# Patient Record
Sex: Female | Born: 1970 | Race: Black or African American | Hispanic: No | Marital: Single | Smoking: Former smoker
Health system: Southern US, Community
[De-identification: ages and names within clinical notes are randomized; demographics above are authoritative.]

## PROBLEM LIST (undated history)

## (undated) DIAGNOSIS — E119 Type 2 diabetes mellitus without complications: Secondary | ICD-10-CM

## (undated) DIAGNOSIS — E1159 Type 2 diabetes mellitus with other circulatory complications: Secondary | ICD-10-CM

## (undated) DIAGNOSIS — E1169 Type 2 diabetes mellitus with other specified complication: Secondary | ICD-10-CM

## (undated) DIAGNOSIS — G4733 Obstructive sleep apnea (adult) (pediatric): Secondary | ICD-10-CM

## (undated) DIAGNOSIS — I10 Essential (primary) hypertension: Secondary | ICD-10-CM

## (undated) DIAGNOSIS — M1990 Unspecified osteoarthritis, unspecified site: Secondary | ICD-10-CM

## (undated) HISTORY — PX: TONSILLECTOMY: SUR1361

## (undated) HISTORY — PX: NOVASURE ABLATION: SHX5394

## (undated) HISTORY — PX: ADENOIDECTOMY: SUR15

## (undated) HISTORY — PX: BREAST REDUCTION SURGERY: SHX8

## (undated) HISTORY — PX: NASAL SEPTUM SURGERY: SHX37

---

## 2000-07-26 ENCOUNTER — Emergency Department (HOSPITAL_COMMUNITY): Admission: EM | Admit: 2000-07-26 | Discharge: 2000-07-26 | Payer: Self-pay | Admitting: Emergency Medicine

## 2000-07-28 ENCOUNTER — Inpatient Hospital Stay (HOSPITAL_COMMUNITY): Admission: RE | Admit: 2000-07-28 | Discharge: 2000-07-28 | Payer: Self-pay

## 2000-08-03 ENCOUNTER — Other Ambulatory Visit: Admission: RE | Admit: 2000-08-03 | Discharge: 2000-08-03 | Payer: Self-pay | Admitting: Obstetrics

## 2000-08-03 ENCOUNTER — Encounter: Admission: RE | Admit: 2000-08-03 | Discharge: 2000-08-03 | Payer: Self-pay | Admitting: Obstetrics

## 2000-09-15 ENCOUNTER — Emergency Department (HOSPITAL_COMMUNITY): Admission: EM | Admit: 2000-09-15 | Discharge: 2000-09-15 | Payer: Self-pay | Admitting: Emergency Medicine

## 2001-08-12 ENCOUNTER — Emergency Department (HOSPITAL_COMMUNITY): Admission: EM | Admit: 2001-08-12 | Discharge: 2001-08-12 | Payer: Self-pay | Admitting: Emergency Medicine

## 2001-08-20 ENCOUNTER — Emergency Department (HOSPITAL_COMMUNITY): Admission: EM | Admit: 2001-08-20 | Discharge: 2001-08-20 | Payer: Self-pay | Admitting: Emergency Medicine

## 2002-08-08 ENCOUNTER — Emergency Department (HOSPITAL_COMMUNITY): Admission: EM | Admit: 2002-08-08 | Discharge: 2002-08-08 | Payer: Self-pay | Admitting: *Deleted

## 2002-08-14 ENCOUNTER — Emergency Department (HOSPITAL_COMMUNITY): Admission: EM | Admit: 2002-08-14 | Discharge: 2002-08-14 | Payer: Self-pay | Admitting: Emergency Medicine

## 2002-08-14 ENCOUNTER — Encounter: Payer: Self-pay | Admitting: Emergency Medicine

## 2002-09-09 ENCOUNTER — Emergency Department (HOSPITAL_COMMUNITY): Admission: EM | Admit: 2002-09-09 | Discharge: 2002-09-10 | Payer: Self-pay | Admitting: Emergency Medicine

## 2002-09-27 ENCOUNTER — Emergency Department (HOSPITAL_COMMUNITY): Admission: EM | Admit: 2002-09-27 | Discharge: 2002-09-27 | Payer: Self-pay | Admitting: Emergency Medicine

## 2003-04-13 ENCOUNTER — Emergency Department (HOSPITAL_COMMUNITY): Admission: EM | Admit: 2003-04-13 | Discharge: 2003-04-13 | Payer: Self-pay | Admitting: Emergency Medicine

## 2003-05-06 ENCOUNTER — Emergency Department (HOSPITAL_COMMUNITY): Admission: EM | Admit: 2003-05-06 | Discharge: 2003-05-06 | Payer: Self-pay | Admitting: Emergency Medicine

## 2003-05-27 ENCOUNTER — Emergency Department (HOSPITAL_COMMUNITY): Admission: EM | Admit: 2003-05-27 | Discharge: 2003-05-27 | Payer: Self-pay

## 2003-10-18 ENCOUNTER — Emergency Department (HOSPITAL_COMMUNITY): Admission: EM | Admit: 2003-10-18 | Discharge: 2003-10-18 | Payer: Self-pay | Admitting: Emergency Medicine

## 2003-11-15 ENCOUNTER — Emergency Department (HOSPITAL_COMMUNITY): Admission: EM | Admit: 2003-11-15 | Discharge: 2003-11-15 | Payer: Self-pay | Admitting: Emergency Medicine

## 2004-05-10 ENCOUNTER — Emergency Department (HOSPITAL_COMMUNITY): Admission: EM | Admit: 2004-05-10 | Discharge: 2004-05-10 | Payer: Self-pay | Admitting: Emergency Medicine

## 2004-05-17 ENCOUNTER — Emergency Department (HOSPITAL_COMMUNITY): Admission: EM | Admit: 2004-05-17 | Discharge: 2004-05-17 | Payer: Self-pay | Admitting: Emergency Medicine

## 2004-06-02 ENCOUNTER — Ambulatory Visit (HOSPITAL_BASED_OUTPATIENT_CLINIC_OR_DEPARTMENT_OTHER): Admission: RE | Admit: 2004-06-02 | Discharge: 2004-06-02 | Payer: Self-pay | Admitting: Otolaryngology

## 2004-07-22 ENCOUNTER — Inpatient Hospital Stay (HOSPITAL_COMMUNITY): Admission: RE | Admit: 2004-07-22 | Discharge: 2004-07-26 | Payer: Self-pay | Admitting: Otolaryngology

## 2004-07-22 ENCOUNTER — Encounter (INDEPENDENT_AMBULATORY_CARE_PROVIDER_SITE_OTHER): Payer: Self-pay | Admitting: *Deleted

## 2004-09-13 ENCOUNTER — Emergency Department (HOSPITAL_COMMUNITY): Admission: EM | Admit: 2004-09-13 | Discharge: 2004-09-13 | Payer: Self-pay | Admitting: Emergency Medicine

## 2004-11-29 ENCOUNTER — Emergency Department (HOSPITAL_COMMUNITY): Admission: EM | Admit: 2004-11-29 | Discharge: 2004-11-29 | Payer: Self-pay | Admitting: Emergency Medicine

## 2004-12-14 ENCOUNTER — Other Ambulatory Visit: Admission: RE | Admit: 2004-12-14 | Discharge: 2004-12-14 | Payer: Self-pay | Admitting: Obstetrics and Gynecology

## 2004-12-16 ENCOUNTER — Ambulatory Visit (HOSPITAL_COMMUNITY): Admission: RE | Admit: 2004-12-16 | Discharge: 2004-12-16 | Payer: Self-pay | Admitting: Obstetrics and Gynecology

## 2004-12-16 ENCOUNTER — Encounter (INDEPENDENT_AMBULATORY_CARE_PROVIDER_SITE_OTHER): Payer: Self-pay | Admitting: *Deleted

## 2006-02-14 ENCOUNTER — Emergency Department (HOSPITAL_COMMUNITY): Admission: EM | Admit: 2006-02-14 | Discharge: 2006-02-14 | Payer: Self-pay | Admitting: Emergency Medicine

## 2006-03-30 ENCOUNTER — Encounter: Admission: RE | Admit: 2006-03-30 | Discharge: 2006-03-30 | Payer: Self-pay | Admitting: Internal Medicine

## 2006-05-16 IMAGING — CR DG CHEST 2V
2 series · 2 of 2 positions shown · non-contrast
Comparison: 09/13/04.

CLINICAL DATA: Vaginal bleeding/weakness/cough.
 CHEST ? TWO VIEW:

[view not recorded (1 of 2)]
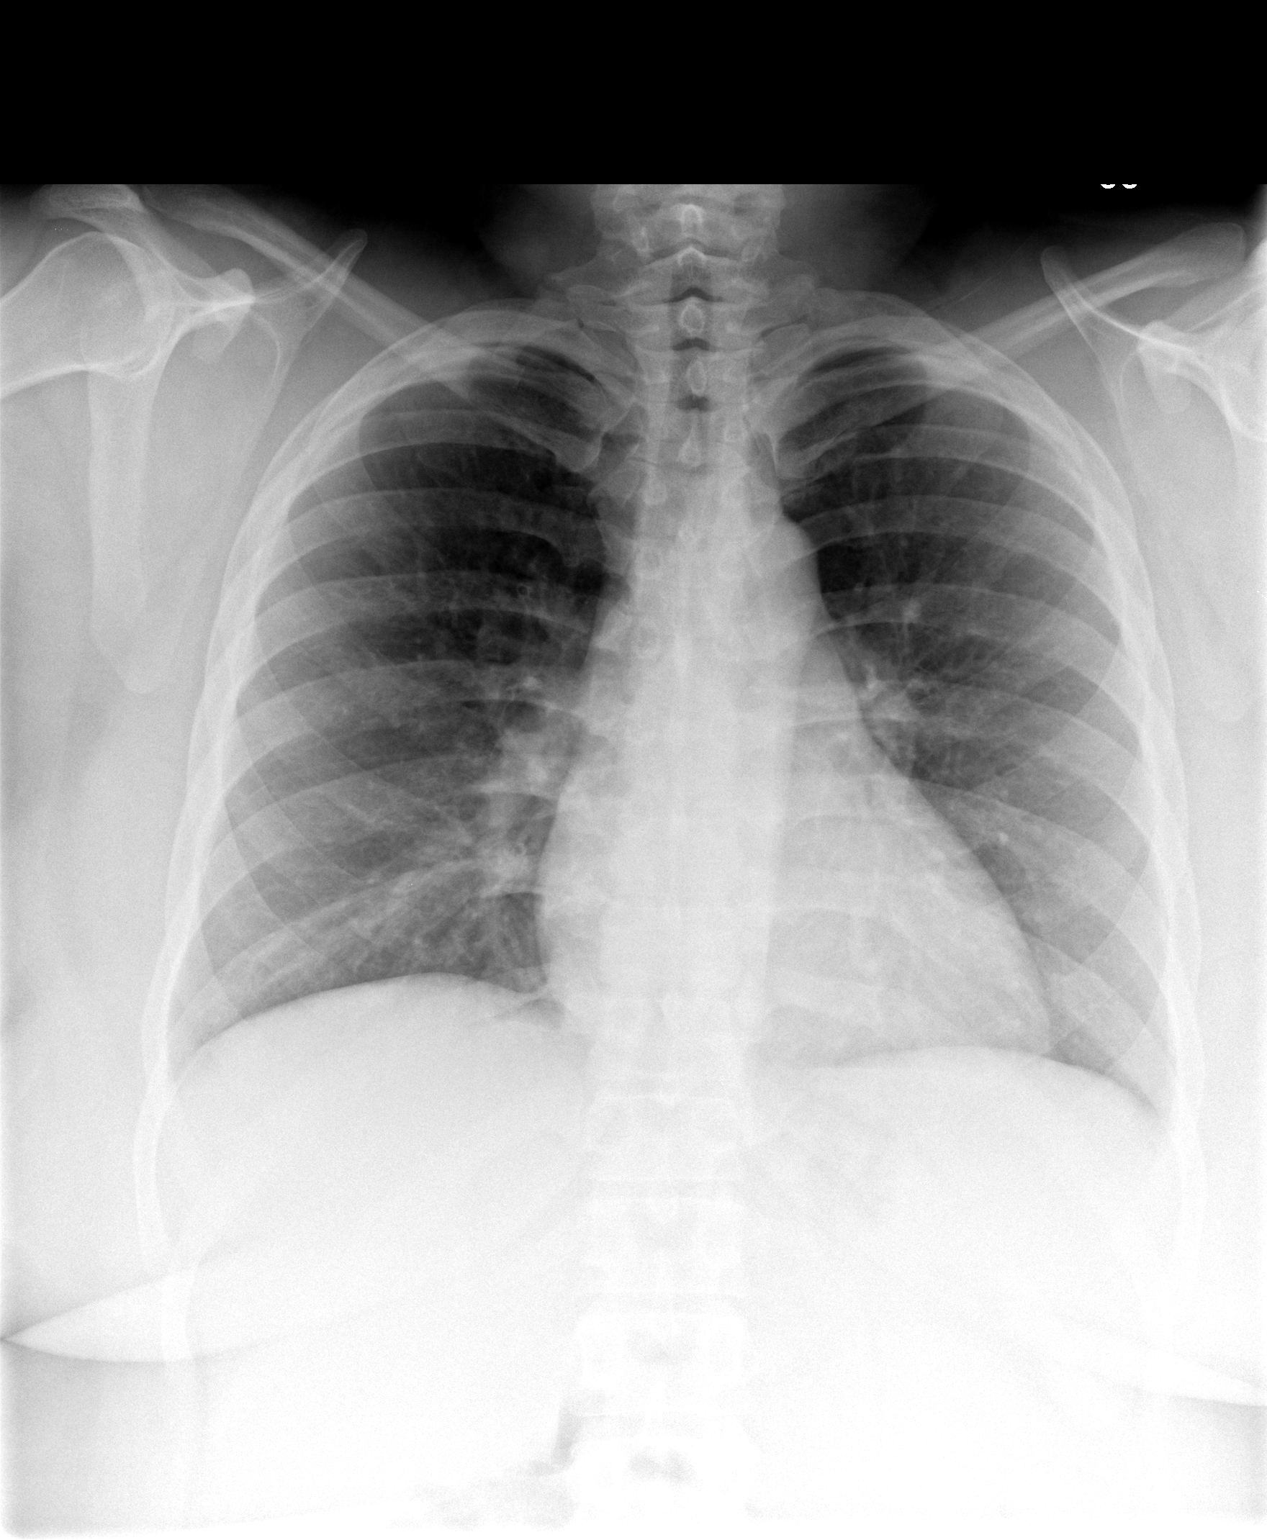

[view not recorded (2 of 2)]
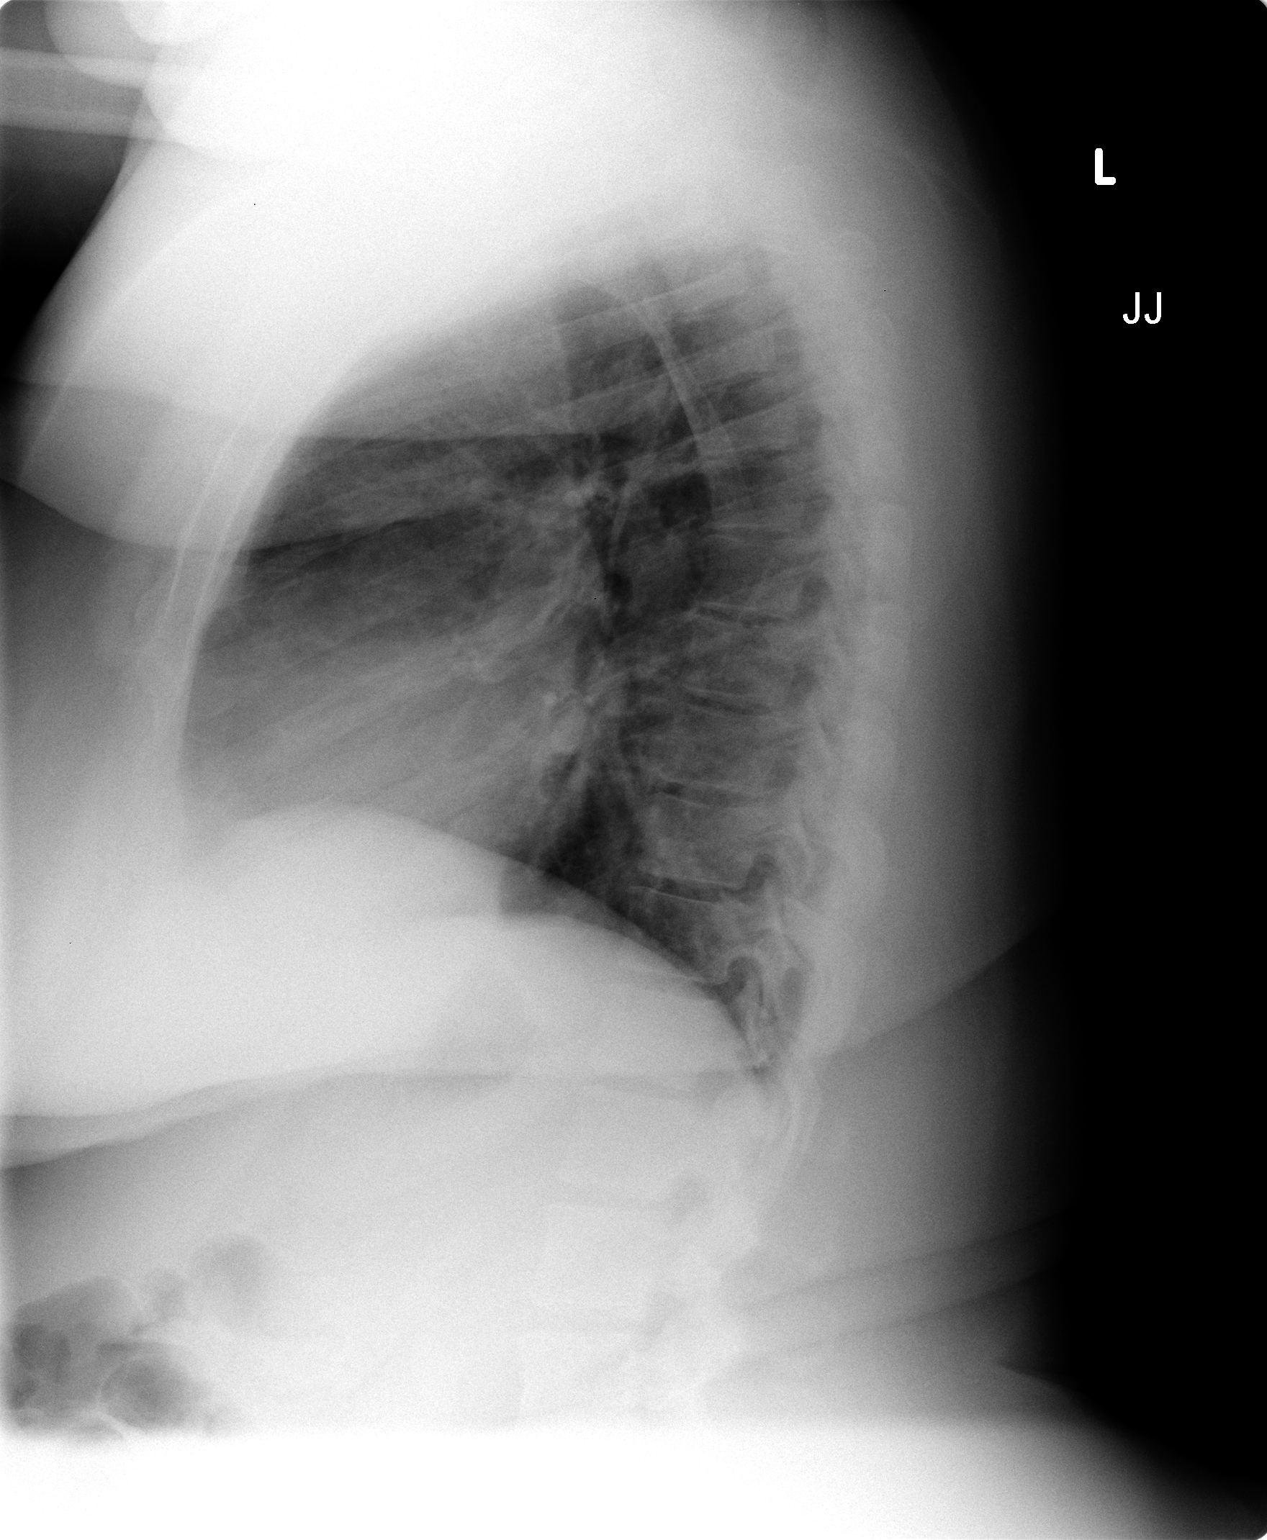

[2 of 2 positions shown; findings below may reference images not displayed]

FINDINGS: Heart within normal limits.  Lungs clear.  Osseous structures intact.
IMPRESSION: No active disease.

## 2007-01-04 ENCOUNTER — Emergency Department (HOSPITAL_COMMUNITY): Admission: EM | Admit: 2007-01-04 | Discharge: 2007-01-04 | Payer: Self-pay | Admitting: Emergency Medicine

## 2007-02-12 ENCOUNTER — Emergency Department (HOSPITAL_COMMUNITY): Admission: EM | Admit: 2007-02-12 | Discharge: 2007-02-12 | Payer: Self-pay | Admitting: Emergency Medicine

## 2007-12-13 ENCOUNTER — Emergency Department (HOSPITAL_COMMUNITY): Admission: EM | Admit: 2007-12-13 | Discharge: 2007-12-13 | Payer: Self-pay | Admitting: Emergency Medicine

## 2007-12-24 ENCOUNTER — Emergency Department (HOSPITAL_COMMUNITY): Admission: EM | Admit: 2007-12-24 | Discharge: 2007-12-24 | Payer: Self-pay | Admitting: Emergency Medicine

## 2009-04-30 ENCOUNTER — Emergency Department (HOSPITAL_COMMUNITY): Admission: EM | Admit: 2009-04-30 | Discharge: 2009-04-30 | Payer: Self-pay | Admitting: Emergency Medicine

## 2009-06-23 ENCOUNTER — Emergency Department (HOSPITAL_COMMUNITY): Admission: EM | Admit: 2009-06-23 | Discharge: 2009-06-23 | Payer: Self-pay | Admitting: Emergency Medicine

## 2009-11-03 ENCOUNTER — Emergency Department (HOSPITAL_COMMUNITY): Admission: EM | Admit: 2009-11-03 | Discharge: 2009-11-03 | Payer: Self-pay | Admitting: Emergency Medicine

## 2010-11-17 ENCOUNTER — Emergency Department (HOSPITAL_COMMUNITY)
Admission: EM | Admit: 2010-11-17 | Discharge: 2010-11-17 | Disposition: A | Discharge status: Home / Self Care | Payer: Self-pay | Attending: Emergency Medicine | Admitting: Emergency Medicine

## 2010-11-17 DIAGNOSIS — IMO0001 Reserved for inherently not codable concepts without codable children: Secondary | ICD-10-CM | POA: Insufficient documentation

## 2010-11-17 DIAGNOSIS — R197 Diarrhea, unspecified: Secondary | ICD-10-CM | POA: Insufficient documentation

## 2010-11-17 DIAGNOSIS — I10 Essential (primary) hypertension: Secondary | ICD-10-CM | POA: Insufficient documentation

## 2010-11-17 DIAGNOSIS — J3489 Other specified disorders of nose and nasal sinuses: Secondary | ICD-10-CM | POA: Insufficient documentation

## 2010-11-17 DIAGNOSIS — A5901 Trichomonal vulvovaginitis: Secondary | ICD-10-CM | POA: Insufficient documentation

## 2010-11-17 DIAGNOSIS — J069 Acute upper respiratory infection, unspecified: Secondary | ICD-10-CM | POA: Insufficient documentation

## 2010-11-17 DIAGNOSIS — R6889 Other general symptoms and signs: Secondary | ICD-10-CM | POA: Insufficient documentation

## 2010-11-17 DIAGNOSIS — R6883 Chills (without fever): Secondary | ICD-10-CM | POA: Insufficient documentation

## 2010-11-17 DIAGNOSIS — R059 Cough, unspecified: Secondary | ICD-10-CM | POA: Insufficient documentation

## 2010-11-17 DIAGNOSIS — R05 Cough: Secondary | ICD-10-CM | POA: Insufficient documentation

## 2010-11-17 DIAGNOSIS — R1013 Epigastric pain: Secondary | ICD-10-CM | POA: Insufficient documentation

## 2010-11-17 LAB — DIFFERENTIAL
Eosinophils Relative: 2 % (ref 0–5)
Lymphocytes Relative: 25 % (ref 12–46)
Lymphs Abs: 2.2 10*3/uL (ref 0.7–4.0)

## 2010-11-17 LAB — POCT I-STAT, CHEM 8
BUN: 7 mg/dL (ref 6–23)
Creatinine, Ser: 0.8 mg/dL (ref 0.4–1.2)
HCT: 39 % (ref 36.0–46.0)
Hemoglobin: 13.3 g/dL (ref 12.0–15.0)
Sodium: 139 mEq/L (ref 135–145)
TCO2: 33 mmol/L (ref 0–100)

## 2010-11-17 LAB — URINALYSIS, ROUTINE W REFLEX MICROSCOPIC
Bilirubin Urine: NEGATIVE
Hgb urine dipstick: NEGATIVE
Ketones, ur: NEGATIVE mg/dL
Specific Gravity, Urine: 1.02 (ref 1.005–1.030)
Urine Glucose, Fasting: NEGATIVE mg/dL
pH: 6 (ref 5.0–8.0)

## 2010-11-17 LAB — URINE MICROSCOPIC-ADD ON

## 2010-11-17 LAB — CBC
Hemoglobin: 11.9 g/dL — ABNORMAL LOW (ref 12.0–15.0)
RDW: 16 % — ABNORMAL HIGH (ref 11.5–15.5)
WBC: 8.7 10*3/uL (ref 4.0–10.5)

## 2010-11-17 LAB — WET PREP, GENITAL: Clue Cells Wet Prep HPF POC: NONE SEEN

## 2010-11-18 LAB — GC/CHLAMYDIA PROBE AMP, GENITAL
Chlamydia, DNA Probe: NEGATIVE
GC Probe Amp, Genital: NEGATIVE

## 2011-01-11 ENCOUNTER — Other Ambulatory Visit: Payer: Self-pay | Admitting: Internal Medicine

## 2011-01-11 DIAGNOSIS — Z1231 Encounter for screening mammogram for malignant neoplasm of breast: Secondary | ICD-10-CM

## 2011-01-14 ENCOUNTER — Other Ambulatory Visit: Payer: Self-pay | Admitting: Obstetrics and Gynecology

## 2011-01-17 ENCOUNTER — Ambulatory Visit
Admission: RE | Admit: 2011-01-17 | Discharge: 2011-01-17 | Disposition: A | Discharge status: Home / Self Care | Payer: Medicaid Other | Source: Ambulatory Visit | Attending: Internal Medicine | Admitting: Internal Medicine

## 2011-01-17 DIAGNOSIS — Z1231 Encounter for screening mammogram for malignant neoplasm of breast: Secondary | ICD-10-CM

## 2011-02-03 ENCOUNTER — Encounter (HOSPITAL_COMMUNITY)
Admission: RE | Admit: 2011-02-03 | Discharge: 2011-02-03 | Disposition: A | Discharge status: Home / Self Care | Payer: Medicaid Other | Source: Ambulatory Visit | Attending: Obstetrics and Gynecology | Admitting: Obstetrics and Gynecology

## 2011-02-04 ENCOUNTER — Ambulatory Visit (HOSPITAL_BASED_OUTPATIENT_CLINIC_OR_DEPARTMENT_OTHER): Payer: Medicaid Other | Attending: Internal Medicine

## 2011-02-04 DIAGNOSIS — G4733 Obstructive sleep apnea (adult) (pediatric): Secondary | ICD-10-CM | POA: Insufficient documentation

## 2011-02-04 DIAGNOSIS — R0609 Other forms of dyspnea: Secondary | ICD-10-CM | POA: Insufficient documentation

## 2011-02-04 DIAGNOSIS — R0989 Other specified symptoms and signs involving the circulatory and respiratory systems: Secondary | ICD-10-CM | POA: Insufficient documentation

## 2011-02-11 ENCOUNTER — Ambulatory Visit (HOSPITAL_COMMUNITY)
Admission: RE | Admit: 2011-02-11 | Payer: Medicaid Other | Source: Ambulatory Visit | Admitting: Obstetrics and Gynecology

## 2011-02-12 DIAGNOSIS — G4733 Obstructive sleep apnea (adult) (pediatric): Secondary | ICD-10-CM

## 2011-02-12 DIAGNOSIS — R0989 Other specified symptoms and signs involving the circulatory and respiratory systems: Secondary | ICD-10-CM

## 2011-02-12 DIAGNOSIS — G4730 Sleep apnea, unspecified: Secondary | ICD-10-CM

## 2011-02-12 DIAGNOSIS — G4710 Hypersomnia, unspecified: Secondary | ICD-10-CM

## 2011-02-12 DIAGNOSIS — R0609 Other forms of dyspnea: Secondary | ICD-10-CM

## 2011-02-12 NOTE — Procedures (Signed)
NAME:  Sara Chan, Sara Chan                ACCOUNT NO.:  [id number]  MEDICAL RECORD NO.:  [id number]          PATIENT TYPE:  OUT  LOCATION:  SLEEP CENTER                 FACILITY:  [facility]  PHYSICIAN:  Clinton D. Maple Hudson, MD, FCCP, FACPDATE OF BIRTH:  01-19-1969  DATE OF STUDY:  02/04/2011                           NOCTURNAL POLYSOMNOGRAM  REFERRING PHYSICIAN:  EDWIN A AVBUERE  INDICATION FOR STUDY:  Hypersomnia with sleep apnea.  EPWORTH SLEEPINESS SCORE:  14/24, BMI 57.6.  Weight 295 pounds, height 60 inches.  Neck 16 inches.  MEDICATIONS:  Home medications are charted and reviewed.  SLEEP ARCHITECTURE:  Split-study protocol.  During the diagnostic phase, total sleep time 120 minutes with sleep efficiency 55.8%.  Stage I was 36.7%, stage II 63.3%, stages III and REM were absent.  Sleep latency 50.5 minutes, awake after sleep onset 44.5 minutes, arousal index 138 indicating increased EEG arousal.  BEDTIME MEDICATION:  None.  RESPIRATORY DATA:  Split-study protocol.  Apnea-hypopnea index (AHI) 155 per hour.  A total of 310 events was scored including 53 obstructive apneas, 3 central apneas, 254 hypopneas.  Events were not positional. CPAP was then titrated to 13 CWP, AHI 0 per hour.  She wore a medium ResMed Mirage Quattro full-face mask with heated humidifier.  OXYGEN DATA:  Moderately loud snoring before CPAP with oxygen desaturation to a nadir of 77% on room air.  With CPAP titration, mean oxygen saturation held 94.1% on room air and snoring was prevented.  CARDIAC DATA:  Sinus rhythm with frequent PACs.  MOVEMENT-PARASOMNIA:  No significant movement disturbance.  Bathroom x1.  IMPRESSIONS-RECOMMENDATIONS:  . 1. Severe obstructive sleep apnea/hypopnea syndrome, AHI 155 per hour     with non positional events, moderately loud snoring and oxygen     desaturation to a nadir of 77% on room air. 2. Successful CPAP titration to 13 CWP, AHI 0 per hour.  She wore a     medium ResMed  Mirage Quattro full-face mask with heated humidifier.     Clinton D. Maple Hudson, MD, [facility], FACP Diplomate, Biomedical engineer of Sleep Medicine Electronically Signed    CDY/MEDQ  D:  02/12/2011 08:43:20  T:  02/12/2011 09:45:05  Job:  [id number]

## 2011-02-14 ENCOUNTER — Other Ambulatory Visit (HOSPITAL_COMMUNITY): Payer: Medicaid Other

## 2011-02-22 ENCOUNTER — Other Ambulatory Visit: Payer: Self-pay | Admitting: Obstetrics and Gynecology

## 2011-02-22 ENCOUNTER — Encounter (HOSPITAL_COMMUNITY): Payer: Medicaid Other

## 2011-02-22 LAB — CBC
HCT: 35.4 % — ABNORMAL LOW (ref 36.0–46.0)
MCH: 26.6 pg (ref 26.0–34.0)
RBC: 4.28 MIL/uL (ref 3.87–5.11)
RDW: 15.6 % — ABNORMAL HIGH (ref 11.5–15.5)
WBC: 7.9 10*3/uL (ref 4.0–10.5)

## 2011-02-22 LAB — PROTIME-INR
INR: 0.94 (ref 0.00–1.49)
Prothrombin Time: 12.8 seconds (ref 11.6–15.2)

## 2011-02-22 LAB — BASIC METABOLIC PANEL
BUN: 8 mg/dL (ref 6–23)
CO2: 26 mEq/L (ref 19–32)
GFR calc non Af Amer: >60 mL/min (ref >60)
Glucose, Bld: 181 mg/dL — ABNORMAL HIGH (ref 70–99)

## 2011-02-22 LAB — URINE MICROSCOPIC-ADD ON

## 2011-02-22 LAB — URINALYSIS, ROUTINE W REFLEX MICROSCOPIC
Ketones, ur: NEGATIVE mg/dL
Leukocytes, UA: NEGATIVE
Urobilinogen, UA: 0.2 mg/dL (ref 0.0–1.0)
pH: 5.5 (ref 5.0–8.0)

## 2011-02-24 ENCOUNTER — Ambulatory Visit (HOSPITAL_COMMUNITY)
Admission: RE | Admit: 2011-02-24 | Discharge: 2011-02-24 | Disposition: A | Discharge status: Home / Self Care | Payer: Medicaid Other | Source: Ambulatory Visit | Attending: Obstetrics and Gynecology | Admitting: Obstetrics and Gynecology

## 2011-02-24 ENCOUNTER — Other Ambulatory Visit: Payer: Self-pay | Admitting: Obstetrics and Gynecology

## 2011-02-24 DIAGNOSIS — N920 Excessive and frequent menstruation with regular cycle: Secondary | ICD-10-CM | POA: Insufficient documentation

## 2011-02-24 DIAGNOSIS — Z01818 Encounter for other preprocedural examination: Secondary | ICD-10-CM | POA: Insufficient documentation

## 2011-02-24 DIAGNOSIS — Z01812 Encounter for preprocedural laboratory examination: Secondary | ICD-10-CM | POA: Insufficient documentation

## 2011-02-24 LAB — GLUCOSE, CAPILLARY: Glucose-Capillary: 143 mg/dL — ABNORMAL HIGH (ref 70–99)

## 2011-02-28 NOTE — Op Note (Signed)
NAME:  Sara Chan, Sara Chan NO.:  [id number]  MEDICAL RECORD NO.:  [id number]           PATIENT TYPE:  O  LOCATION:  [facility]                          FACILITY:  [facility]  PHYSICIAN:  Miguel Aschoff, M.D.       DATE OF BIRTH:  Jun 07, 1971  DATE OF PROCEDURE:  02/24/2011 DATE OF DISCHARGE:                              OPERATIVE REPORT   PREOPERATIVE DIAGNOSIS:  Menorrhagia.  POSTOPERATIVE DIAGNOSIS:  Menorrhagia.  PROCEDURE:  Cervical dilatation, hysteroscopy, uterine curettage followed by NovaSure endometrial ablation.  SURGEON:  Miguel Aschoff, MD  ANESTHESIA:  General.  COMPLICATIONS:  None.  JUSTIFICATION:  The patient is a 40 year old black female with history of regular, but very heavy periods which cause her to pass clots and interfere with her ability to carry out her normal routines of life. Because of the menorrhagia, she presents now to undergo D and C, hysteroscopy and endometrial ablation in an effort to reduce or eliminate the heavy bleeding.  The risks and benefits of procedure were discussed with the patient and informed consent has been obtained.  PROCEDURE:  The patient was taken to the operating room and placed in supine position.  General anesthesia was administered without difficulty.  She was then placed in dorsal lithotomy position, prepped and draped in the usual sterile fashion.  After this was done, bladder was catheterized.  Examination under anesthesia revealed normal external genitalia, normal Bartholin, Skene glands, normal urethra.  The vaginal vault was without gross lesion.  The cervix was without gross lesion. Uterus noted to be anterior, normal in size and shape.  No adnexal masses were noted.  At this point, a speculum was placed in the vaginal vault.  The anterior cervical lip was grasped with tenaculum and then the cervix was sounded to 8 cm.  A cervical length of 4 cm was then established.  Once this was done, the cervix was further  dilated and then diagnostic hysteroscope was advanced through the endocervical canal.  No endocervical lesions were noted.  Inspection of the uterine fundus did not reveal any polyps or submucous myomas or obvious lesions within the fundus.  At this point, the hysteroscope was removed, sharp vigorous curettage was carried out with medium-size serrated curette, small amount of curettings obtained were sent for histologic study. Once this was done, the NovaSure endometrial ablation unit was introduced into uterine cavity, cavity width of 3.5 cm was found, and then once this was done, a cavity assessment test was carried out and passed and then a treatment cycle for 1 minute 15 seconds was carried out at 77 watts.  On completion of the treatment cycle, the hysteroscope was reintroduced.  There appeared to be good coagulation of the endometrial cavity with no other abnormalities being noted.  The instruments were removed.  The cervix was injected with total of 10 mL of 1% Xylocaine for postop analgesia.  The patient was taken out of lithotomy position and brought to recovery room in satisfactory condition.  Estimated blood loss from the procedure was approximately 20 mL.  The patient tolerated the procedure well.  Plan is for the  patient to be discharged home.  Medications for home include doxycycline 100 mg twice a day x3 days, Ultram 50 mg p.o. t.i.d. The patient will be seen back in 4 weeks for follow up examination and is to call for any problems such as fever, pain or heavy bleeding.     Miguel Aschoff, M.D.     AR/MEDQ  D:  02/24/2011  T:  02/25/2011  Job:  [id number]  Electronically Signed by Miguel Aschoff M.D. on 02/28/2011 09:10:45 AM

## 2011-03-04 NOTE — Op Note (Signed)
NAME:  Sara Chan, Sara Chan                ACCOUNT NO.:  [id number]   MEDICAL RECORD NO.:  [id number]          PATIENT TYPE:  INP   LOCATION:  2550                         FACILITY:  [facility]   PHYSICIAN:  Zola Button T. Lazarus Salines, M.D. DATE OF BIRTH:  October 24, 1970   DATE OF PROCEDURE:  07/22/2004  DATE OF DISCHARGE:                                 OPERATIVE REPORT   PREOPERATIVE DIAGNOSES:  1.  Nasal septal deviation.  2.  Hypertrophic inferior turbinates.  3.  Adenotonsillar hypertrophy with obstruction.  4.  Obstructive sleep apnea.  5.  Morbid obesity.   POSTOPERATIVE DIAGNOSES:  1.  Nasal septal deviation.  2.  Hypertrophic inferior turbinates.  3.  Adenotonsillar hypertrophy with obstruction.  4.  Obstructive sleep apnea.  5.  Morbid obesity.   OPERATION PERFORMED:  1.  Nasal septoplasty.  2.  Bilateral submucous resection of inferior turbinates.  3.  Tonsillectomy and adenoidectomy.   SURGEON:  Gloris Manchester. Lazarus Salines, M.D.   ANESTHESIA:  General orotracheal anesthesia.   ESTIMATED BLOOD LOSS:  Less than 25 mL.   COMPLICATIONS:  None.   FINDINGS:  An overall somewhat small nose and very congested despite having  used preoperative Afrin spray.  A thick nasal septum deviated towards the  right anteriorly.  Hypertrophic inferior turbinates on both sides.  3 almost  4+ tonsils protruding and somewhat imbedded.  Circumferential soft tissue  redundancy and narrowing of the oropharynx and nasopharynx.  Extremely bulky  tongue.   DESCRIPTION OF PROCEDURE:  With the patient in the comfortable supine  position, general orotracheal anesthesia was induced without difficulty.  At  an appropriate level, the table was turned 90 degrees and the patient was  placed in a semisitting position.  A saline moistened throat pack was  placed.  Cocaine crystals, 200 mg total were applied on cotton carriers to  the anterior ethmoid and sphenopalatine ganglion regions on both sides.  Cocaine solution, 160 mg  total was applied on 1/2 x 3 inch cottonoids to  both sides of the nasal septum.  1% Xylocaine with 1:100,000 epinephrine, 12  mL total was infiltrated into the anterior floor of the nose, into the  membranous columella, into the anterior pole of the inferior turbinates, and  into the submucoperichondrial plane of the nasal septum on both sides.  Several minutes were allowed for hemostasis and anesthesia to take effect.  A sterile preparation and draping of the midface was accomplished.   The materials were removed from the nose and observed to be intact and  correct and number.  The findings were as described above.  A left-sided  anterior floor incision was executed.  A right-sided hemitransfixion  incision was executed.  Floor tunnels were elevated on both sides.  The  submucoperichondrial plane of the right septum was elevated up to the dorsum  of the nose back onto the perpendicular plate, brought downward and  communicated with the floor tunnel and brought forward.  The chondroethmoid  junction was identified and opened with a Risk analyst.  The opposite  submucoperiosteal plane was dissected.  The superior perpendicular plate  was  lysed with an open Jansen-Middleton forceps.  An inferior portion was then  submucosally dissected, rocked free and delivered with closed Leane Para-  Middleton forceps.  Bony spicules were carefully removed.  A 2 mm strip of  inferior quadrangular cartilage was resected to allow it to trap door into  the midline.  A 1 mm strip of deflected caudal edge of the quadrangular  cartilage was also resected.  The posterior inferior corner of the  quadrangular cartilage was resected.  The maxillary crest under this area  was removed with the osteotome and mallet.  This allowed the septum to  trapdoor into the midline.  It had a tendency to state deflected towards the  left side.  An incision between the septum and the upper lateral cartilage  was made in the tunnel  on the right side and transmucosally on the left  side.  This allowed more mobility to the septum.  The septum was secured to  the nasal spine with a figure-of-eight 4-0 PDS suture.  Hemostasis was  observed.  The septal tunnels were suctioned free.  Both flaps were  essentially intact.  Several incisions were closed with interrupted 4-0  chromic sutures.  Just prior to completing the septoplasty, the inferior  turbinates were infiltrated with 6 mL total of 1% Xylocaine with 1:100,000  epinephrine.  Following completion of the septoplasty, beginning on the  right side, the anterior hood of the inferior turbinate was lysed just  behind the nasal valve region.  The medial mucosa was incised in an anterior  upsloping fashion.  The turbinate was outfractured.  A laterally based flap  was developed.  Using angled turbinate scissors, the turbinate bone and  lateral mucosa was resected in a posterior downsloping fashion taking  virtually all of the anterior pole, leaving virtually all of the posterior  pole.  Bony spicules were carefully removed.  The cut mucosal edges were  suction coagulated.  At this point  the flap was laid back down, the  turbinate was outfractured and this side was completed.  The left side was  done in identical fashion.   0.040 reinforced Silastic splints were fashioned and placed against the  nasal septum and secured there with a 3-0 nylon stitch.  Double thickness  Telfa pads impregnated with Neosporin ointment were placed along the  inferior turbinates on each side.  6.5 mm nasal trumpets were shortened to  reach the nasopharynx and were placed on each side between the packing and  the Silastic to serve as part of the nasal packing but to allow some  postoperative airway.  Hemostasis was observed.  This completed the nasal  procedure.   At this point the patient was flattened and then placed in true Trendelenburg.  The draping was adjusted.  The throat pack was  removed after  first suctioning the pharynx clean.  The endotracheal tube was untaped and  positioned in the midline.  Taking care to protect lips, teeth and an  endotracheal tube, the Crowe-Davis mouth gag, a three-grooved blade was  introduced.  This did not provide adequate visualization.  Attempt was made  to get adequate visualization with a #4 flat blade which also was  inadequate.  Finally, a #4 grooved blade was placed and this provided  reasonable exposure.  The tonsils were touching one another in the midline.  The mouth gag was suspended from the Mayo stand in the standard fashion.  I  could not see the nasopharynx at all  owing to the bulk of the tonsils.  0.5%  Xylocaine with 1:200,000 epinephrine, 8 mL total was infiltrated in to the  peritonsillar planes for intraoperative hemostasis.  Several minutes were  allowed for this to take effect.   Beginning on the right side, the tonsil was grasped and retracted medially.  The mucosa overlying the anterior and superior poles was coagulated and then  cut down to the capsule of the tonsil.  Using the cautery tip as a blunt  dissector, lysing fibrous bands, and coagulating crossing vessels as  identified, the tonsil was dissected free of its muscular fossa from  inferiorly upward.  The dissection was very difficult owing to the bulky  disease and the relative poor exposure related to her bulky tongue and  rather small mandible.  The tonsil was removed in its entirety as determined  by examination of both tonsil and fossa.  A small additional quantity of  cautery rendered the fossa hemostatic.   After completing the right tonsillectomy, the left side was done in  identical fashion after first repositioning the mouth gag for better access.  After removing both tonsils and rendering the oropharynx hemostatic, a red  rubber catheter was passed through one of the nasal trumpets and out the  mouth to serve as a palate retractor.  Using  suction cautery and indirect  visualization, the moderate sized adenoid pad contained within the very  narrow nasopharynx was ablated with electrocautery.  No dissection was  required.  Hemostasis was observed.   At this point the palate retractor and mouth gag were relaxed for several  minutes.  Upon re-expansion, hemostasis was persistent.  A single 3-0 Vicryl  stitch was placed to approximate the posterior and anterior tonsillar  pillars on each side without difficulty.  An orogastric tube was passed into  the stomach and a small quantity of clear secretions was evacuated.  The  orogastric tube was removed.  At this point the mouth gag was relaxed once  more for approximately five minutes.  Upon re-expansion, hemostasis was  persistent.  At this point the palate retractor and mouth gag were relaxed  and removed.  The dental status was intact.  The patient was returned to anesthesia, awakened, extubated and transferred to recovery in stable  condition.   COMMENT:  A 40 year old black female with long history of snoring, poor rest  quality and a sleep study showing apnea index of 160 per hour was the  indication for today's surgery.  As a compromise with the patient we did not  perform uvulopalatopharyngoplasty or a tracheostomy.  Anticipate a routine  postoperative recovery with observation in the intermediate care unit until  she is breathing comfortably, taking decent p.o. and achieving adequate pain  relief.      Karo   KTW/MEDQ  D:  07/22/2004  T:  07/22/2004  Job:  [id number]

## 2011-03-04 NOTE — Procedures (Signed)
NAME:  Sara Chan, BREED                ACCOUNT NO.:  [id number]   MEDICAL RECORD NO.:  [id number]          PATIENT TYPE:  OUT   LOCATION:  SLEEP CENTER                 FACILITY:  [facility]   PHYSICIAN:  Clinton D. Maple Hudson, M.D. DATE OF BIRTH:  29-Nov-1970   DATE OF ADMISSION:  06/02/2004  DATE OF DISCHARGE:  06/02/2004                              NOCTURNAL POLYSOMNOGRAM   REFERRING PHYSICIAN:  Zola Button T. Lazarus Salines, M.D.   INDICATION FOR STUDY AND HISTORY:  1. Hypersomnia with sleep apnea.  2. Complaint of daytime somnolence.   Epworth sleepiness score 16/24, neck size 17.5 inches, BMI 49.8, weight 275  pounds.   MEDICATIONS:  None listed.   SLEEP ARCHITECTURE:  Total sleep time 393 minutes with sleep efficiency of  77%.  Stage I was 16%; stage II, 62%; Stages III and IV 4%.  REM was 17% of  total sleep time.  Latency to sleep onset 19.5 minutes, latency to REM 311  minutes.  Awake after sleep onset 99 minute.  Arousal index 92 per hour.  Bathroom x 2.   RESPIRATORY DATA:  Split study protocol.  Very severe obstructive sleep  apnea/hypopnea syndrome, RDI 161 per hour before CPAP titration.  This  included 320 obstructive apneas and 138 hypopneas.  Events were not  positional.  REM RDI was 62 per hour.  CPAP was then titrated to 23 CWP, RDI  1.9 per hour.  Several masks were tried for comfort before settling ono a  Education officer, community Vreeze nasal tube mask with large nasal pillows and a heated  humidifier.   OXYGEN DATA:  Loud snoring with moderate to severe oxygen desaturation,  nadir of 71%, during apneas.  After CPAP control, oxygen saturation was held  at 90 to 93% on room air.   CARDIAC DATA:  Sinus rhythm with PACs and occasional PVC.   MOVEMENT AND PARASOMNIA:  Occasional leg jerks with arousal.   IMPRESSION AND RECOMMENDATIONS:  Very severe obstructive sleep  apnea/hypopnea syndrome, RDI 161 per hour with oxygen desaturation to 71%.  CPAP control at 23 CWP using a large pillow nasal  tube mask and heated  humidifier.  She may eventually be more comfortable with BiPAP if this  pressure is not well tolerated, or evaluation for alternative therapies.   Clinton D. Maple Hudson, M.D.  Diplomate, Biomedical engineer of Sleep Medicine                                   ______________________________                                Rennis Chris. Maple Hudson, M.D.                                Diplomate, American Board of Sleep Medicine    CDY/MEDQ  D:  06/06/2004 12:34:00  T:  06/07/2004 00:19:11  Job:  [id number]

## 2011-03-04 NOTE — Op Note (Signed)
NAME:  Sara Chan, Sara Chan                ACCOUNT NO.:  [id number]   MEDICAL RECORD NO.:  [id number]          PATIENT TYPE:  AMB   LOCATION:  [facility]                           FACILITY:  [facility]   PHYSICIAN:  Juluis Mire, M.D.   DATE OF BIRTH:  05-05-1971   DATE OF PROCEDURE:  12/16/2004  DATE OF DISCHARGE:                                 OPERATIVE REPORT   PREOPERATIVE DIAGNOSES:  1.  Dysfunctional uterine bleeding.  2.  Endometrial polyps.  3.  Right vaginal cyst.   POSTOPERATIVE DIAGNOSES:  1.  Dysfunctional uterine bleeding.  2.  Endometrial polyps.  3.  Right vaginal cyst, with the finding of a mucinous vaginal cyst on the      right side.   PROCEDURES:  1.  Hysteroscopy.  2.  Endometrial resections.  3.  Endometrial curettings.  4.  Resection of right paravaginal cyst.   SURGEON:  Juluis Mire, M.D.   ANESTHESIA:  MAC with local and paracervical block.   ESTIMATED BLOOD LOSS:  Minimal.   PACKS AND DRAINS:  None.   INTRAOPERATIVE BLOOD REPLACED:  None.   COMPLICATIONS:  None.   INDICATIONS:  As dictated in the history and physical.   The procedure is as follows:  The patient was taken to the OR and placed in  the supine position.  After sedation, the patient was placed in the dorsal  lithotomy position using the Allen stirrups.  The patient was draped as a  sterile field.  A speculum was placed in the vaginal vault.  The vagina and  cervix were cleansed with Betadine.  She did have a paravaginal cyst on the  left side.  We infiltrated this with 1% Nesacaine.  An incision was made  over the cystic structure.  A lot of mucinous material was removed. We  resected the cyst lining and sent it for pathologic review.  The vaginal  incision was closed with a running suture of 3-0 Vicryl.  We had good  hemostasis.  At this point in time, a paracervical block was instituted  using 1% Nesacaine, the cervix grasped with a single-tooth tenaculum.  The  uterus sounded to  approximately 11 cm.  The cervix was serially dilated to a  size 35 Pratt dilator.  Operative hysteroscope was then introduced.  Hysteroscopic evaluation revealed multiple polyp-like outgrowths from the  anterior and posterior wall.  These were all resected and sent for  pathologic review.  At the end of the procedure, actually the majority of  the endometrium had been resected.  There was no active bleeding.  Total  deficit was 90 mL.  There was no sign of perforation.  We then did  endometrial curettings.  At this point in time the single-tooth tenaculum  and speculum were then removed.  The patient was taken out of the dorsal  lithotomy position, once alert transferred to the recovery room in good  condition.  Sponge, instrument and needle count reported as correct by the  circulating nurse.      JSM/MEDQ  D:  12/16/2004  T:  12/16/2004  Job:  [id number]

## 2011-03-04 NOTE — H&P (Signed)
NAME:  Sara Chan, Sara Chan NO.:  [id number]   MEDICAL RECORD NO.:  [id number]          PATIENT TYPE:  AMB   LOCATION:  SDC                           FACILITY:  [facility]   PHYSICIAN:  Juluis Mire, M.D.   DATE OF BIRTH:  04/05/1971   DATE OF ADMISSION:  12/16/2004  DATE OF DISCHARGE:                                HISTORY & PHYSICAL   HISTORY OF PRESENT ILLNESS:  The patient is a 40 year old gravida 1, para 1  black female who presents for hysteroscopy as well as D&C.   In relation to the present admission, the patient had been seen in the  emergency room at [facility] complaining of heavy vaginal  bleeding.  She did have a history of oligomenorrhea.  Her hemoglobin was  depressed at that time with a value to 9.5.  She was placed on birth control  pills for management.  A urine pregnancy test was negative.  She has had a  previous bilateral tubal ligation.  We continued her on pills; this did  improve bleeding.  Her hemoglobin stabilized in the 8.5 range.  We did a  saline infusion ultrasound with the finding of multiple endometrial polyps.  In view of this, she now proceeds for hysteroscopic resection and D&C for  management of anovulatory cycles with associated endometrial polyps.   ALLERGIES:  She is allergic to CODEINE.   MEDICATIONS:  Medications include birth control pills and iron sulfate  supplementation.   PAST MEDICAL HISTORY:  Usual childhood diseases, no significant sequelae.   PAST SURGICAL HISTORY:  1.  She has had a previous tubal ligation in 1999.  2.  Tonsillectomy and nasal septum removed in 2005.  3.  She had a breast reduction in 1997.   OBSTETRICAL HISTORY:  She has had 1 previous cesarean section done in 1999.   FAMILY HISTORY:  Parents have a history of diabetes as well as hypertension.   SOCIAL HISTORY:  A half pack per day tobacco use, no alcohol use.   REVIEW OF SYSTEMS:  Review of systems is noncontributory.   PHYSICAL  EXAMINATION:  VITAL SIGNS:  The patient is afebrile with stable  vital signs.  HEENT:  The patient is normocephalic.  Pupils are equal, round and reactive  to light and accommodation.  Extraocular movements were intact.  Sclerae and  conjunctivae were clear.  Oropharynx clear.  NECK:  Neck without thyromegaly.  BREASTS:  Reductive scars are noted, no dominant masses.  LUNGS:  Clear.  CARDIOVASCULAR:  Regular rate and rhythm with no murmurs, rubs, or gallops.  ABDOMEN:  Abdominal exam is benign.  Well-healed low transverse incision.  No mass or organomegaly.  PELVIC:  Normal extensor tendon.  Vagina mucosa is clear.  Cervix  unremarkable.  Uterus at upper limits of normal size.  Adnexa unremarkable.  EXTREMITIES:  Trace edema.  NEUROLOGIC:  Exam is grossly within normal limits.   IMPRESSION:  Dysfunctional uterine bleeding with endometrial polyps.   PLAN:  The patient will undergo hysteroscopic evaluation and resection of  polyps, as well as D&C for management  of dysfunctional bleeding.  The risks  of surgery have been discussed including the risk of infection, the risk of  hemorrhage that could require transfusion with the risk of AIDS or  hepatitis, risk of perforation that could lead to injury to adjacent organs  requiring exploratory surgery, the risks of deep venous thrombosis and  pulmonary embolus.  The patient expressed understanding of indications and  risks.      JSM/MEDQ  D:  12/16/2004  T:  12/16/2004  Job:  [id number]

## 2011-03-04 NOTE — Discharge Summary (Signed)
NAME:  KYILEE, GREGG                ACCOUNT NO.:  [id number]   MEDICAL RECORD NO.:  [id number]          PATIENT TYPE:  INP   LOCATION:  5710                         FACILITY:  [facility]   PHYSICIAN:  Zola Button T. Lazarus Salines, M.D. DATE OF BIRTH:  December 29, 1970   DATE OF ADMISSION:  07/22/2004  DATE OF DISCHARGE:  07/26/2004                                 DISCHARGE SUMMARY   CHIEF COMPLAINT:  Obstructive sleep apnea, nasal obstruction.   HISTORY OF PRESENT ILLNESS:  A 40 year old black female with long history of  very large tonsils, congested nose.  She had a nocturnal polysomnogram  showing respiratory disturbance index of 160 per hour with oxygen  desaturation to 70%.  She has been obese for a very long time.  No history  of thyroid dysfunction.   PAST MEDICAL HISTORY:  CODEINE causes nausea.  No current medications.  No  history of diabetes, hypertension, stroke, heart attack, asthma, epilepsy,  hepatitis, HIV.  No known problems with bleeding or anesthesia reactions.  No history of deep vein thrombosis or sickle cell.   SOCIAL HISTORY:  She is single.  She works.  She smokes 1/2 pack per day and  does not drink.   FAMILY HISTORY:  Positive for cancer, heart attack, reflux, hypertension,  stroke, diabetes.   REVIEW OF SYSTEMS:  Noncontributory.   EXAMINATION:  GENERAL:  This is an obese, alert, adult black female in no  distress.  HEENT:  She has a narrow congested nose.  She has 4+ right tonsil, 2+ left  tonsil.  She has a slightly thick, long soft palate.  No alveolar or  pharyngeal insufficiency.  NECK:  No neck adenopathy.  LUNGS:  Were distant but clear and unlabored.  HEART:  Revealed regular rate and rhythm without murmurs.  ABDOMEN:  Obese, distant, active.  EXTREMITIES:  With normal configuration as was the skin.   ADMISSION DIAGNOSES:  1.  Deviated nasal septum and hypertrophic inferior turbinates with      obstruction.  2.  Hypertropic tonsils with obstruction.  3.   Obstructive sleep apnea.   HOSPITAL COURSE:  On the day of her admission, the patient was taken to the  operating room where she underwent septoplasty, reduction of turbinates,  tonsil removal, adenoid ablation.  She tolerated the surgery nicely and  transferred from the recovery room to the 3300 intermediate care unit for  observation.  She was breathing adequately with decent O2 saturation on room  air but for several days was unable to take any more than minimal p.o.  The  nasal packs and tubes were removed on the first postoperative day with good  tolerance.  The patient was observed 3 additional days until she had resumed  decent p.o. input being supported with IV hydration and antibiosis in the  meantime.  On the 4th postoperative day, she was discharged to her home in  the care of her family.  We will remove the nasal septal splints in 6 more  days.  Emphasized analgesia and hydration.   DISCHARGE DIAGNOSES:  Obstructive sleep apnea, morbid obesity, nasal septal  deviation, hypertrophic inferior turbinates, hypertrophic tonsils and  adenoids.   PROCEDURE PERFORMED:  Septoplasty, SMR turbinates, tonsillectomy,  adenoidectomy, October 6th.   COMPLICATIONS:  None.   CONDITION ON DISCHARGE:  Ambulatory, pain controlled, taking decent p.o.  No  breathing difficulty.  Return visit 6 days.  Prescriptions are Roxicet,  Lortab, and cephalexin liquid.      Karo   KTW/MEDQ  D:  08/30/2004  T:  08/30/2004  Job:  [id number]   cc:   [facility]

## 2011-04-04 ENCOUNTER — Emergency Department (HOSPITAL_COMMUNITY)
Admission: EM | Admit: 2011-04-04 | Discharge: 2011-04-04 | Disposition: A | Discharge status: Home / Self Care | Payer: Medicaid Other | Attending: Emergency Medicine | Admitting: Emergency Medicine

## 2011-04-04 DIAGNOSIS — I10 Essential (primary) hypertension: Secondary | ICD-10-CM | POA: Insufficient documentation

## 2011-04-04 DIAGNOSIS — T7840XA Allergy, unspecified, initial encounter: Secondary | ICD-10-CM | POA: Insufficient documentation

## 2011-04-04 DIAGNOSIS — R220 Localized swelling, mass and lump, head: Secondary | ICD-10-CM | POA: Insufficient documentation

## 2011-04-04 DIAGNOSIS — L299 Pruritus, unspecified: Secondary | ICD-10-CM | POA: Insufficient documentation

## 2011-07-11 LAB — COMPREHENSIVE METABOLIC PANEL
AST: 15
Albumin: 3 — ABNORMAL LOW
CO2: 27
Calcium: 8.5
Creatinine, Ser: 0.57
GFR calc Af Amer: >60
GFR calc non Af Amer: >60

## 2011-07-11 LAB — CBC
MCHC: 34.4
MCV: 83.1
Platelets: 312
RDW: 14.2

## 2011-07-11 LAB — DIFFERENTIAL
Eosinophils Relative: 1
Lymphocytes Relative: 24
Lymphs Abs: 1.6

## 2011-07-11 LAB — URINE MICROSCOPIC-ADD ON

## 2011-07-11 LAB — URINALYSIS, ROUTINE W REFLEX MICROSCOPIC
Leukocytes, UA: NEGATIVE
Protein, ur: NEGATIVE
Urobilinogen, UA: 0.2

## 2011-07-11 LAB — GC/CHLAMYDIA PROBE AMP, GENITAL: GC Probe Amp, Genital: NEGATIVE

## 2011-12-16 ENCOUNTER — Emergency Department (HOSPITAL_COMMUNITY)
Admission: EM | Admit: 2011-12-16 | Discharge: 2011-12-16 | Disposition: A | Discharge status: Home / Self Care | Payer: Medicaid Other | Attending: Emergency Medicine | Admitting: Emergency Medicine

## 2011-12-16 ENCOUNTER — Encounter (HOSPITAL_COMMUNITY): Payer: Self-pay | Admitting: Emergency Medicine

## 2011-12-16 ENCOUNTER — Emergency Department (HOSPITAL_COMMUNITY): Payer: Medicaid Other

## 2011-12-16 DIAGNOSIS — J329 Chronic sinusitis, unspecified: Secondary | ICD-10-CM | POA: Insufficient documentation

## 2011-12-16 DIAGNOSIS — R062 Wheezing: Secondary | ICD-10-CM | POA: Insufficient documentation

## 2011-12-16 DIAGNOSIS — R197 Diarrhea, unspecified: Secondary | ICD-10-CM | POA: Insufficient documentation

## 2011-12-16 DIAGNOSIS — R059 Cough, unspecified: Secondary | ICD-10-CM | POA: Insufficient documentation

## 2011-12-16 DIAGNOSIS — J3489 Other specified disorders of nose and nasal sinuses: Secondary | ICD-10-CM | POA: Insufficient documentation

## 2011-12-16 DIAGNOSIS — R112 Nausea with vomiting, unspecified: Secondary | ICD-10-CM | POA: Insufficient documentation

## 2011-12-16 DIAGNOSIS — R05 Cough: Secondary | ICD-10-CM | POA: Insufficient documentation

## 2011-12-16 DIAGNOSIS — I10 Essential (primary) hypertension: Secondary | ICD-10-CM | POA: Insufficient documentation

## 2011-12-16 DIAGNOSIS — Z79899 Other long term (current) drug therapy: Secondary | ICD-10-CM | POA: Insufficient documentation

## 2011-12-16 HISTORY — DX: Essential (primary) hypertension: I10

## 2011-12-16 MED ORDER — HYDROCOD POLST-CHLORPHEN POLST 10-8 MG/5ML PO LQCR: 5.0000 mL | Freq: Once | ORAL | Status: DC

## 2011-12-16 MED ORDER — ALBUTEROL SULFATE HFA 108 (90 BASE) MCG/ACT IN AERS
2.0000 | INHALATION_SPRAY | RESPIRATORY_TRACT | Status: DC | PRN
  Administered 2011-12-16: 2 via RESPIRATORY_TRACT
  Filled 2011-12-16: qty 6.7

## 2011-12-16 MED ORDER — AMOXICILLIN-POT CLAVULANATE 500-125 MG PO TABS: 1.0000 | ORAL_TABLET | Freq: Three times a day (TID) | ORAL | Status: AC

## 2011-12-16 NOTE — ED Notes (Signed)
Instructed patient in use of inhaler and remained with her during her 2 puffs and she did it as instructed

## 2011-12-16 NOTE — ED Provider Notes (Signed)
History     CSN: [id number]  Arrival date & time 12/16/11  4098   First MD Initiated Contact with Patient 12/16/11 [phone]      Chief Complaint  Patient presents with  . Nausea  . Emesis  . Diarrhea  . Cough    (Consider location/radiation/quality/duration/timing/severity/associated sxs/prior treatment) HPI Pt presents to the ED with complaints of cough and congestion for 1 month that is worsening as well as N/V/D yesterday that lasted one day. Pt has not had any diarrhea or vomiting in over 18 hours and is not currently nauseous. Her main concern is sinus congestion and cough. She denies having fevers, chills, body aches. 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 She denies significant past medical hx or being immunocompromised.   Past Medical History  Diagnosis Date  . Hypertension     No past surgical history on file.  No family history on file.  History  Substance Use Topics  . Smoking status: Not on file  . Smokeless tobacco: Not on file  . Alcohol Use:     OB History    Grav Para Term Preterm Abortions TAB SAB Ect Mult Living                  Review of Systems  All other systems reviewed and are negative.    Allergies  Codeine and Flagyl  Home Medications   Current Outpatient Rx  Name Route Sig Dispense Refill  . ACETAMINOPHEN 500 MG PO TABS Oral Take 500-1,000 mg by mouth every 6 (six) hours as needed. For pain.    Marland Kitchen ALKA-SELTZER PLUS COLD PO Oral Take 2 tablets by mouth 2 (two) times daily as needed. For cold symptoms.    . GUAIFENESIN ER 600 MG PO TB12 Oral Take 600 mg by mouth 2 (two) times daily as needed. For congestion.    . IBUPROFEN 200 MG PO TABS Oral Take 800 mg by mouth every 8 (eight) hours as needed. For pain.    Marland Kitchen LOSARTAN POTASSIUM 50 MG PO TABS Oral Take 50 mg by mouth daily.    Marland Kitchen METFORMIN HCL 500 MG PO TABS Oral Take 500 mg by mouth 2 (two) times daily with a meal.    . NYQUIL PO Oral Take 2 capsules by mouth at bedtime as needed. For  cold symptoms.    . DAYQUIL PO Oral Take 2 capsules by mouth every 8 (eight) hours as needed. For cold symptoms.    . AMOXICILLIN-POT CLAVULANATE 500-125 MG PO TABS Oral Take 1 tablet (500 mg total) by mouth every 8 (eight) hours. 21 tablet 0    BP 118/58  Pulse 98  Temp(Src) 98.4 F (36.9 C) (Oral)  Ht 5\' 1"  (1.549 m)  Wt 265 lb (120.203 kg)  BMI 50.07 kg/m2  SpO2 2%  Physical Exam  Nursing note and vitals reviewed. Constitutional: She appears well-developed and well-nourished. No distress.  HENT:  Head: Normocephalic and atraumatic.  Right Ear: Tympanic membrane normal.  Left Ear: Tympanic membrane normal.  Nose: Rhinorrhea and sinus tenderness present. No epistaxis.  No foreign bodies. Right sinus exhibits maxillary sinus tenderness and frontal sinus tenderness. Left sinus exhibits maxillary sinus tenderness and frontal sinus tenderness.  Mouth/Throat: Uvula is midline, oropharynx is clear and moist and mucous membranes are normal.  Eyes: Pupils are equal, round, and reactive to light.  Neck: Normal range of motion. Neck supple.  Cardiovascular: Normal rate and regular rhythm.   Pulmonary/Chest: Effort normal. No respiratory distress.  She has wheezes. She has no rales.  Abdominal: Soft. She exhibits no distension and no mass. There is no tenderness. There is no rebound and no guarding.  Neurological: She is alert.  Skin: Skin is warm and dry.    ED Course  Procedures (including critical care time)  Labs Reviewed - No data to display Dg Chest 2 View  12/16/2011  *RADIOLOGY REPORT*  Clinical Data: Cough.  Nausea and vomiting.  Diarrhea. Hypertension and diabetes.  CHEST - 2 VIEW  Comparison:  11/29/2004  Findings:  The heart size and mediastinal contours are within normal limits.  Both lungs are clear.  The visualized skeletal structures are unremarkable.  IMPRESSION: No active cardiopulmonary disease.  Original Report Authenticated By: Danae Orleans, M.D.     1. Sinusitis         MDM  Pt given Albuterol inhaler in ED to treat cough. Also given Rx for Augmenting due to prolonged nasal congestion and sinus tenderness. Pt can follow-up with PCP in 1-2 weeks if symptoms not resolving. Pt to return to the ED if symptoms change or worsen.        Dorthula Matas, PA 12/16/11 [phone]

## 2011-12-16 NOTE — ED Notes (Signed)
Mask and o2 at 2 L per [place] placed as patient is coughing

## 2011-12-16 NOTE — Discharge Instructions (Signed)

## 2011-12-16 NOTE — ED Notes (Signed)
Patient transported to X-ray Returned at [facility]

## 2011-12-16 NOTE — ED Notes (Signed)
Came in today because she is weak and coughing and needs to find out what is going on

## 2011-12-16 NOTE — ED Notes (Signed)
Patient transported to X-ray 

## 2011-12-18 NOTE — ED Provider Notes (Signed)
Medical screening examination/treatment/procedure(s) were performed by non-physician practitioner and as supervising physician I was immediately available for consultation/collaboration.   Mykenzi Vanzile M Maysen Bonsignore, MD 12/18/11 2245 

## 2012-10-22 ENCOUNTER — Other Ambulatory Visit: Payer: Self-pay | Admitting: Internal Medicine

## 2012-10-22 DIAGNOSIS — Z1231 Encounter for screening mammogram for malignant neoplasm of breast: Secondary | ICD-10-CM

## 2012-10-22 DIAGNOSIS — Z9889 Other specified postprocedural states: Secondary | ICD-10-CM

## 2012-11-08 ENCOUNTER — Ambulatory Visit
Admission: RE | Admit: 2012-11-08 | Discharge: 2012-11-08 | Disposition: A | Discharge status: Home / Self Care | Payer: Medicaid Other | Source: Ambulatory Visit | Attending: Internal Medicine | Admitting: Internal Medicine

## 2012-11-08 DIAGNOSIS — Z1231 Encounter for screening mammogram for malignant neoplasm of breast: Secondary | ICD-10-CM

## 2012-11-08 DIAGNOSIS — Z9889 Other specified postprocedural states: Secondary | ICD-10-CM

## 2013-04-17 ENCOUNTER — Emergency Department (HOSPITAL_COMMUNITY): Payer: Medicaid Other

## 2013-04-17 ENCOUNTER — Emergency Department (HOSPITAL_COMMUNITY)
Admission: EM | Admit: 2013-04-17 | Discharge: 2013-04-17 | Disposition: A | Discharge status: Home / Self Care | Payer: Medicaid Other | Attending: Emergency Medicine | Admitting: Emergency Medicine

## 2013-04-17 ENCOUNTER — Encounter (HOSPITAL_COMMUNITY): Payer: Self-pay | Admitting: Emergency Medicine

## 2013-04-17 DIAGNOSIS — Y929 Unspecified place or not applicable: Secondary | ICD-10-CM | POA: Insufficient documentation

## 2013-04-17 DIAGNOSIS — I10 Essential (primary) hypertension: Secondary | ICD-10-CM | POA: Insufficient documentation

## 2013-04-17 DIAGNOSIS — X503XXA Overexertion from repetitive movements, initial encounter: Secondary | ICD-10-CM | POA: Insufficient documentation

## 2013-04-17 DIAGNOSIS — G5601 Carpal tunnel syndrome, right upper limb: Secondary | ICD-10-CM

## 2013-04-17 DIAGNOSIS — Z79899 Other long term (current) drug therapy: Secondary | ICD-10-CM | POA: Insufficient documentation

## 2013-04-17 DIAGNOSIS — M79641 Pain in right hand: Secondary | ICD-10-CM

## 2013-04-17 DIAGNOSIS — Y990 Civilian activity done for income or pay: Secondary | ICD-10-CM | POA: Insufficient documentation

## 2013-04-17 DIAGNOSIS — G5600 Carpal tunnel syndrome, unspecified upper limb: Secondary | ICD-10-CM | POA: Insufficient documentation

## 2013-04-17 DIAGNOSIS — IMO0001 Reserved for inherently not codable concepts without codable children: Secondary | ICD-10-CM | POA: Insufficient documentation

## 2013-04-17 DIAGNOSIS — R209 Unspecified disturbances of skin sensation: Secondary | ICD-10-CM | POA: Insufficient documentation

## 2013-04-17 MED ORDER — TRAMADOL HCL 50 MG PO TABS: 50.0000 mg | ORAL_TABLET | Freq: Four times a day (QID) | ORAL | Status: DC | PRN

## 2013-04-17 NOTE — ED Notes (Signed)
Pt sts she has pain from in her R hand that radiates up to her R shoulder. No distress. Full ROM noted.

## 2013-04-17 NOTE — ED Provider Notes (Signed)
History    CSN: [id number] Arrival date & time 04/17/13  1021  First MD Initiated Contact with Patient 04/17/13 1047     No chief complaint on file.  (Consider location/radiation/quality/duration/timing/severity/associated sxs/prior Treatment) HPI  Patient is a 42 year old female presenting to the emergency department for intermittent sharp right hand pain with radiation right arm. Patient states she gets associated numbness and tingling in her fingertips of her hand which has pain exacerbation. Patient rates her pain 8-10. No alleviating or aggravating factors. Patient is right-hand dominant. Patient does repetitive motions at work. Denies any injury or trauma to hand.   Past Medical History  Diagnosis Date  . Hypertension    History reviewed. No pertinent past surgical history. No family history on file. History  Substance Use Topics  . Smoking status: Never Smoker   . Smokeless tobacco: Never Used  . Alcohol Use: No   OB History   Grav Para Term Preterm Abortions TAB SAB Ect Mult Living                 Review of Systems  Constitutional: Negative for fever and chills.  Respiratory: Negative for shortness of breath.   Cardiovascular: Negative for chest pain.  Musculoskeletal: Positive for myalgias and arthralgias.  Skin: Negative.  Negative for wound.    Allergies  Codeine and Flagyl  Home Medications   Current Outpatient Rx  Name  Route  Sig  Dispense  Refill  . cetirizine (ZYRTEC) 10 MG tablet   Oral   Take 10 mg by mouth daily.         Marland Kitchen ibuprofen (ADVIL,MOTRIN) 200 MG tablet   Oral   Take 800 mg by mouth every 8 (eight) hours as needed. For pain.         Marland Kitchen losartan (COZAAR) 50 MG tablet   Oral   Take 50 mg by mouth daily.         . metFORMIN (GLUCOPHAGE) 500 MG tablet   Oral   Take 500 mg by mouth 2 (two) times daily with a meal.         . traMADol (ULTRAM) 50 MG tablet   Oral   Take 1 tablet (50 mg total) by mouth every 6 (six) hours as  needed for pain.   8 tablet   0    BP 124/69  Pulse 85  Temp(Src) 98.7 F (37.1 C) (Oral)  Resp 15  SpO2 99%  LMP 04/10/2013 Physical Exam  Constitutional: She is oriented to person, place, and time. She appears well-developed and well-nourished. No distress.  HENT:  Head: Normocephalic and atraumatic.  Eyes: Conjunctivae are normal.  Neck: Neck supple.  Musculoskeletal:       Right wrist: She exhibits tenderness. She exhibits no bony tenderness, no swelling, no effusion, no crepitus, no deformity and no laceration.  Positive Phalen's sign. No decreased sensation right hand on exam. Radial and ulnar pulses intact. Cap refill <2 sec  Neurological: She is alert and oriented to person, place, and time.  Skin: Skin is warm and dry. She is not diaphoretic.  Psychiatric: She has a normal mood and affect.    ED Course  Procedures (including critical care time) Labs Reviewed - No data to display Dg Wrist Complete Right  04/17/2013   *RADIOLOGY REPORT*  Clinical Data: Injury with pain  RIGHT WRIST - COMPLETE 3+ VIEW  Comparison: None.  Findings: No evidence of fracture or dislocation.  No degenerative change or other focal lesion.  IMPRESSION:  Normal radiographs   Original Report Authenticated By: Paulina Fusi, M.D.   1. 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 Positive Phalen's sign. Imaging shows no fracture. Directed pt to ice injury, take acetaminophen or ibuprofen for pain, and to elevate and rest the injury when possible. Splinted wrist for support and comfort. Provided back to work note w/ activities as tolerated. Advised PCP f/u. Patient is agreeable to plan. Patient is stable at time of discharge      Jeannetta Ellis, PA-C 04/17/13 1531

## 2013-04-17 NOTE — ED Provider Notes (Signed)
Medical screening examination/treatment/procedure(s) were performed by non-physician practitioner and as supervising physician I was immediately available for consultation/collaboration.   Rolan Bucco, MD 04/17/13 1537

## 2014-05-08 ENCOUNTER — Emergency Department (HOSPITAL_COMMUNITY)
Admission: EM | Admit: 2014-05-08 | Discharge: 2014-05-08 | Disposition: A | Discharge status: Home / Self Care | Payer: No Typology Code available for payment source | Attending: Emergency Medicine | Admitting: Emergency Medicine

## 2014-05-08 ENCOUNTER — Encounter (HOSPITAL_COMMUNITY): Payer: Self-pay | Admitting: Emergency Medicine

## 2014-05-08 ENCOUNTER — Emergency Department (HOSPITAL_COMMUNITY): Payer: No Typology Code available for payment source

## 2014-05-08 DIAGNOSIS — F17200 Nicotine dependence, unspecified, uncomplicated: Secondary | ICD-10-CM | POA: Insufficient documentation

## 2014-05-08 DIAGNOSIS — Z79899 Other long term (current) drug therapy: Secondary | ICD-10-CM | POA: Insufficient documentation

## 2014-05-08 DIAGNOSIS — Z791 Long term (current) use of non-steroidal anti-inflammatories (NSAID): Secondary | ICD-10-CM | POA: Insufficient documentation

## 2014-05-08 DIAGNOSIS — M19011 Primary osteoarthritis, right shoulder: Secondary | ICD-10-CM

## 2014-05-08 DIAGNOSIS — I10 Essential (primary) hypertension: Secondary | ICD-10-CM | POA: Insufficient documentation

## 2014-05-08 DIAGNOSIS — M25519 Pain in unspecified shoulder: Secondary | ICD-10-CM | POA: Insufficient documentation

## 2014-05-08 DIAGNOSIS — E119 Type 2 diabetes mellitus without complications: Secondary | ICD-10-CM | POA: Insufficient documentation

## 2014-05-08 DIAGNOSIS — M19019 Primary osteoarthritis, unspecified shoulder: Secondary | ICD-10-CM | POA: Insufficient documentation

## 2014-05-08 HISTORY — DX: Type 2 diabetes mellitus without complications: E11.9

## 2014-05-08 MED ORDER — IBUPROFEN 800 MG PO TABS: 800.0000 mg | ORAL_TABLET | Freq: Three times a day (TID) | ORAL | Status: DC

## 2014-05-08 NOTE — ED Notes (Signed)
Initial contact - pt reports pain to R shoulder xmonths, 8/10, raised bumps noted. Pt denies other complaints.  MAEI.  Skin PWD. NAD.

## 2014-05-08 NOTE — ED Notes (Signed)
Pt having knot on right shoulder with pain down right arm.  Does not recall injury.  Knot/pain for 2 months.  Visible raised are noted.

## 2014-05-08 NOTE — Discharge Instructions (Signed)
Please read and follow all provided instructions.  Your diagnoses today include:  1. Arthritis of right acromioclavicular joint     Tests performed today include:  An x-ray of the affected area - does NOT show any broken bones, shows spurring of the right AC (acromioclavicular) joint.   Vital signs. See below for your results today.   Medications prescribed:   Ibuprofen (Motrin, Advil) - anti-inflammatory pain medication  Do not exceed 600mg  ibuprofen every 6 hours, take with food  You have been prescribed an anti-inflammatory medication or NSAID. Take with food. Take smallest effective dose for the shortest duration needed for your pain. Stop taking if you experience stomach pain or vomiting.   Take any prescribed medications only as directed.  Home care instructions:   Follow any educational materials contained in this packet  Follow R.I.C.E. Protocol:  R - rest your injury   I  - use ice on injury without applying directly to skin  C - compress injury with bandage or splint  E - elevate the injury as much as possible  Follow-up instructions: Please follow-up with the provided orthopedic physician (bone specialist) in 1 week.   Return instructions:   Please return if your toes are numb or tingling, appear gray or blue, or you have severe pain (also elevate leg and loosen splint or wrap if you were given one)  Please return to the Emergency Department if you experience worsening symptoms.   Please return if you have any other emergent concerns.  Additional Information:  Your vital signs today were: BP 145/77   Pulse 98   Temp(Src) 99 F (37.2 C) (Oral)   Resp 16   SpO2 96% If your blood pressure (BP) was elevated above 135/85 this visit, please have this repeated by your doctor within one month. --------------

## 2014-05-08 NOTE — ED Provider Notes (Signed)
CSN: [id number]     Arrival date & time 05/08/14  1035 History   First MD Initiated Contact with Patient 05/08/14 1051     Chief Complaint  Patient presents with  . Shoulder Pain     (Consider location/radiation/quality/duration/timing/severity/associated sxs/prior Treatment) HPI Comments: Patient presents with complaint of worsening right upper extremity pain. Patient has noted a 'knot' on her right shoulder that has gradually been enlarging. Area is tender. Area is aching. No known injury. Pain is worse with palpation and movement. She has not had any fever, nausea or vomiting. No neck pain. No tingling or numbness in her hands. Patient has pain in entire R hand. No redness. No new treatments prior other than over-the-counter NSAIDs. Onset of symptoms acute. Course is constant.  Patient is a 43 y.o. female presenting with shoulder pain. The history is provided by the patient.  Shoulder Pain Associated symptoms include arthralgias, joint swelling and myalgias. Pertinent negatives include no abdominal pain, chest pain, coughing, fever, headaches, nausea, rash, sore throat or vomiting.    Past Medical History  Diagnosis Date  . Hypertension   . Diabetes mellitus without complication    Past Surgical History  Procedure Laterality Date  . Breast reduction surgery    . Cesarean section    . Tonsillectomy    . Adenoidectomy    . Nasal septum surgery    . Novasure ablation     History reviewed. No pertinent family history. History  Substance Use Topics  . Smoking status: Current Every Day Smoker -- 0.50 packs/day    Types: Cigarettes  . Smokeless tobacco: Never Used  . Alcohol Use: No   OB History   Grav Para Term Preterm Abortions TAB SAB Ect Mult Living                 Review of Systems  Constitutional: Negative for fever.  HENT: Negative for rhinorrhea and sore throat.   Eyes: Negative for redness.  Respiratory: Negative for cough.   Cardiovascular: Negative for chest  pain.  Gastrointestinal: Negative for nausea, vomiting, abdominal pain and diarrhea.  Genitourinary: Negative for dysuria.  Musculoskeletal: Positive for arthralgias, joint swelling and myalgias.  Skin: Negative for rash.  Neurological: Negative for headaches.   Allergies  Codeine and Flagyl  Home Medications   Prior to Admission medications   Medication Sig Start Date End Date Taking? Authorizing Provider  Aspirin-Salicylamide-Caffeine (BC HEADACHE POWDER PO) Take 1 Package by mouth every 6 (six) hours as needed (pain).   Yes Historical Provider, MD  ibuprofen (ADVIL,MOTRIN) 200 MG tablet Take 800 mg by mouth every 8 (eight) hours as needed. For pain.   Yes Historical Provider, MD  losartan-hydrochlorothiazide (HYZAAR) 50-12.5 MG per tablet Take 1 tablet by mouth daily.   Yes Historical Provider, MD  metFORMIN (GLUCOPHAGE) 500 MG tablet Take 500 mg by mouth daily with breakfast.    Yes Historical Provider, MD  Multiple Vitamins-Minerals (MULTIVITAMIN GUMMIES ADULT) CHEW Chew 2 tablets by mouth daily.   Yes Historical Provider, MD  naproxen sodium (ANAPROX) 220 MG tablet Take 440 mg by mouth 2 (two) times daily as needed (pain).   Yes Historical Provider, MD   BP 145/77  Pulse 98  Temp(Src) 99 F (37.2 C) (Oral)  Resp 16  SpO2 96%  Physical Exam  Nursing note and vitals reviewed. Constitutional: She appears well-developed and well-nourished.  HENT:  Head: Normocephalic and atraumatic.  Eyes: Pupils are equal, round, and reactive to light.  Neck: Normal range of  motion. Neck supple.  Cardiovascular: Exam reveals no decreased pulses.   Musculoskeletal: Normal range of motion. She exhibits no edema and no tenderness.  There is a 2 cm hard nodule noted in the vicinity of the right [facility] joint. Normal upper extremity strength and range of motion actively.  Neurological: She is alert. No sensory deficit.  Motor, sensation, and vascular distal to the injury is fully intact.   Skin: Skin  is warm and dry.  Psychiatric: She has a normal mood and affect.    ED Course  Procedures (including critical care time) Labs Review Labs Reviewed - No data to display  Imaging Review No results found.   EKG Interpretation None      11:22 AM Patient seen and examined. Work-up initiated.    Vital signs reviewed and are as follows: Filed Vitals:   05/08/14 1040  BP: 145/77  Pulse: 98  Temp: 99 F (37.2 C)  Resp: 16   12:20 PM X-ray reviewed by myself. Patient informed of results. She would like ibuprofen 800 mg tablets for pain. Will also give orthopedic followup.  Patient urged to return with worsening symptoms or other concerns. Patient verbalized understanding and agrees with plan.    MDM   Final diagnoses:  Arthritis of right acromioclavicular joint   Patient with pain and swelling in the area of the right a.c. joint. X-ray shows arthritis. She does have pain which radiates down her right arm, question element of impingement. Doubt septic joint. She will need to follow up with an orthopedic physician. Her upper extremity is neurovascularly intact I do not suspect any emergencies at this time. Patient to use anti-inflammatory medications for pain and followup.    Carlisle Cater, PA-C 05/08/14 1221

## 2014-05-09 NOTE — ED Provider Notes (Signed)
Medical screening examination/treatment/procedure(s) were performed by non-physician practitioner and as supervising physician I was immediately available for consultation/collaboration.   Houston Siren III, MD 05/09/14 [phone]

## 2014-10-14 ENCOUNTER — Emergency Department (HOSPITAL_COMMUNITY)
Admission: EM | Admit: 2014-10-14 | Discharge: 2014-10-14 | Disposition: A | Discharge status: Home / Self Care | Payer: No Typology Code available for payment source | Attending: Emergency Medicine | Admitting: Emergency Medicine

## 2014-10-14 ENCOUNTER — Encounter (HOSPITAL_COMMUNITY): Payer: Self-pay | Admitting: Emergency Medicine

## 2014-10-14 DIAGNOSIS — Z791 Long term (current) use of non-steroidal anti-inflammatories (NSAID): Secondary | ICD-10-CM | POA: Insufficient documentation

## 2014-10-14 DIAGNOSIS — Z79899 Other long term (current) drug therapy: Secondary | ICD-10-CM | POA: Insufficient documentation

## 2014-10-14 DIAGNOSIS — Z87891 Personal history of nicotine dependence: Secondary | ICD-10-CM | POA: Insufficient documentation

## 2014-10-14 DIAGNOSIS — I10 Essential (primary) hypertension: Secondary | ICD-10-CM | POA: Insufficient documentation

## 2014-10-14 DIAGNOSIS — E119 Type 2 diabetes mellitus without complications: Secondary | ICD-10-CM | POA: Insufficient documentation

## 2014-10-14 DIAGNOSIS — H6121 Impacted cerumen, right ear: Secondary | ICD-10-CM | POA: Insufficient documentation

## 2014-10-14 MED ORDER — DOCUSATE SODIUM 50 MG/5ML PO LIQD
50.0000 mg | Freq: Once | ORAL | Status: AC
  Administered 2014-10-14: 50 mg via ORAL
  Filled 2014-10-14: qty 10

## 2014-10-14 NOTE — ED Provider Notes (Signed)
CSN: [id number]     Arrival date & time 10/14/14  1439 History  This chart was scribed for Sara Bible, PA-C with Arbie Cookey, MD by Edison Simon, ED Scribe. This patient was seen in room WTR6/WTR6 and the patient's care was started at 3:41 PM.    No chief complaint on file.  The history is provided by the patient. No language interpreter was used.    HPI Comments: Sara Chan is a 43 y.o. female who presents to the Emergency Department complaining of throbbing right ear pain with gradual onset yesterday.   She reports associated hearing loss in that ear. She states she has felt like there is something dripping from it, but has not observed any discharge. She states she suspected it was wax buildup, so she used OTC wax removed without improvement. She denies any injury or trauma. She denies cough, congestion, or fever.  Past Medical History  Diagnosis Date  . Hypertension   . Diabetes mellitus without complication    Past Surgical History  Procedure Laterality Date  . Breast reduction surgery    . Cesarean section    . Tonsillectomy    . Adenoidectomy    . Nasal septum surgery    . Novasure ablation     No family history on file. History  Substance Use Topics  . Smoking status: Former Smoker -- 0.00 packs/day    Quit date: 07/17/2014  . Smokeless tobacco: Never Used  . Alcohol Use: No   OB History    No data available     Review of Systems  Constitutional: Negative for fever.  HENT: Positive for ear pain and hearing loss. Negative for congestion and ear discharge.   Respiratory: Negative for cough.       Allergies  Codeine and Flagyl  Home Medications   Prior to Admission medications   Medication Sig Start Date End Date Taking? Authorizing Provider  Aspirin-Salicylamide-Caffeine (BC HEADACHE POWDER PO) Take 1 Package by mouth every 6 (six) hours as needed (pain).    Historical Provider, MD  ibuprofen (ADVIL,MOTRIN) 200 MG tablet Take 800 mg by mouth  every 8 (eight) hours as needed. For pain.    Historical Provider, MD  ibuprofen (ADVIL,MOTRIN) 800 MG tablet Take 1 tablet (800 mg total) by mouth 3 (three) times daily. 05/08/14   Carlisle Cater, PA-C  losartan-hydrochlorothiazide (HYZAAR) 50-12.5 MG per tablet Take 1 tablet by mouth daily.    Historical Provider, MD  metFORMIN (GLUCOPHAGE) 500 MG tablet Take 500 mg by mouth daily with breakfast.     Historical Provider, MD  Multiple Vitamins-Minerals (MULTIVITAMIN GUMMIES ADULT) CHEW Chew 2 tablets by mouth daily.    Historical Provider, MD  naproxen sodium (ANAPROX) 220 MG tablet Take 440 mg by mouth 2 (two) times daily as needed (pain).    Historical Provider, MD   BP 135/72 mmHg  Pulse 81  Temp(Src) 98.7 F (37.1 C) (Oral)  Resp 16  SpO2 100% Physical Exam  Constitutional: She is oriented to person, place, and time. She appears well-developed and well-nourished.  HENT:  Head: Normocephalic and atraumatic.  Right Ear: Ear canal normal. No mastoid tenderness.  Left Ear: Tympanic membrane and ear canal normal. No mastoid tenderness.  Cerumen impaction in right ear.  Unable to visualize right TM.  Eyes: Conjunctivae are normal.  Neck: Normal range of motion. Neck supple.  Cardiovascular: Normal rate, regular rhythm and normal heart sounds.   No murmur heard. Pulmonary/Chest: Effort normal and breath sounds  normal. No respiratory distress. She has no wheezes. She has no rales.  Musculoskeletal: Normal range of motion.  Neurological: She is alert and oriented to person, place, and time.  Skin: Skin is warm and dry.  Psychiatric: She has a normal mood and affect.  Nursing note and vitals reviewed.   ED Course  Procedures (including critical care time)  DIAGNOSTIC STUDIES: Oxygen Saturation is 100% on room air, normal by my interpretation.    COORDINATION OF CARE: 3:44 PM Discussed treatment plan with patient at beside, the patient agrees with the plan and has no further questions  at this time.   Labs Review Labs Reviewed - No data to display  Imaging Review No results found.   EKG Interpretation None     Reassessed patient after ear irrigation.  EAC is now clear.  Normal TM and EAC. MDM   Final diagnoses:  None   Patient presents today with decreased hearing of the right ear.  She has a cerumen impaction of the ear.  No signs of infection.  Ear irrigated in the ED with good results.  Patient stable for discharge.  Return precautions given.   Sara Bible, PA-C 10/14/14 [place], MD 10/15/14 [phone]

## 2014-10-14 NOTE — ED Notes (Signed)
Pt reports ear pain that is described as a throbbing. Pt tried was removal at home but says it is worse at this time. No other complaints.

## 2014-10-14 NOTE — Discharge Instructions (Signed)
Cerumen Impaction °A cerumen impaction is when the wax in your ear forms a plug. This plug usually causes reduced hearing. Sometimes it also causes an earache or dizziness. Removing a cerumen impaction can be difficult and painful. The wax sticks to the ear canal. The canal is sensitive and bleeds easily. If you try to remove a heavy wax buildup with a cotton tipped swab, you may push it in further. °Irrigation with water, suction, and small ear curettes may be used to clear out the wax. If the impaction is fixed to the skin in the ear canal, ear drops may be needed for a few days to loosen the wax. People who build up a lot of wax frequently can use ear wax removal products available in your local drugstore. °SEEK MEDICAL CARE IF:  °You develop an earache, increased hearing loss, or marked dizziness. °Document Released: 11/10/2004 Document Revised: 12/26/2011 Document Reviewed: 12/31/2009 °[employer]® Patient Information ©2015 [employer]. This information is not intended to replace advice given to you by your health care provider. Make sure you discuss any questions you have with your health care provider. ° °

## 2014-12-13 ENCOUNTER — Encounter (HOSPITAL_COMMUNITY): Payer: Self-pay | Admitting: Emergency Medicine

## 2014-12-13 ENCOUNTER — Emergency Department (HOSPITAL_COMMUNITY)
Admission: EM | Admit: 2014-12-13 | Discharge: 2014-12-14 | Disposition: A | Discharge status: Home / Self Care | Payer: Self-pay | Attending: Emergency Medicine | Admitting: Emergency Medicine

## 2014-12-13 DIAGNOSIS — Z87891 Personal history of nicotine dependence: Secondary | ICD-10-CM | POA: Insufficient documentation

## 2014-12-13 DIAGNOSIS — Z9049 Acquired absence of other specified parts of digestive tract: Secondary | ICD-10-CM | POA: Insufficient documentation

## 2014-12-13 DIAGNOSIS — Z9889 Other specified postprocedural states: Secondary | ICD-10-CM | POA: Insufficient documentation

## 2014-12-13 DIAGNOSIS — I10 Essential (primary) hypertension: Secondary | ICD-10-CM | POA: Insufficient documentation

## 2014-12-13 DIAGNOSIS — N898 Other specified noninflammatory disorders of vagina: Secondary | ICD-10-CM | POA: Insufficient documentation

## 2014-12-13 DIAGNOSIS — R1084 Generalized abdominal pain: Secondary | ICD-10-CM | POA: Insufficient documentation

## 2014-12-13 DIAGNOSIS — Z79899 Other long term (current) drug therapy: Secondary | ICD-10-CM | POA: Insufficient documentation

## 2014-12-13 DIAGNOSIS — R102 Pelvic and perineal pain: Secondary | ICD-10-CM | POA: Insufficient documentation

## 2014-12-13 DIAGNOSIS — Z3202 Encounter for pregnancy test, result negative: Secondary | ICD-10-CM | POA: Insufficient documentation

## 2014-12-13 DIAGNOSIS — E119 Type 2 diabetes mellitus without complications: Secondary | ICD-10-CM | POA: Insufficient documentation

## 2014-12-13 DIAGNOSIS — R109 Unspecified abdominal pain: Secondary | ICD-10-CM

## 2014-12-13 LAB — CBC WITH DIFFERENTIAL/PLATELET
BASOS ABS: 0 10*3/uL (ref 0.0–0.1)
BASOS PCT: 0 % (ref 0–1)
EOS ABS: 0.1 10*3/uL (ref 0.0–0.7)
EOS PCT: 1 % (ref 0–5)
HCT: 35.5 % — ABNORMAL LOW (ref 36.0–46.0)
Hemoglobin: 11.3 g/dL — ABNORMAL LOW (ref 12.0–15.0)
Lymphocytes Relative: 14 % (ref 12–46)
Lymphs Abs: 1.4 10*3/uL (ref 0.7–4.0)
MCH: 27.2 pg (ref 26.0–34.0)
MCHC: 31.8 g/dL (ref 30.0–36.0)
MCV: 85.5 fL (ref 78.0–100.0)
Monocytes Absolute: 0.6 10*3/uL (ref 0.1–1.0)
Monocytes Relative: 6 % (ref 3–12)
Neutro Abs: 7.9 10*3/uL — ABNORMAL HIGH (ref 1.7–7.7)
Neutrophils Relative %: 79 % — ABNORMAL HIGH (ref 43–77)
Platelets: 288 10*3/uL (ref 150–400)
RBC: 4.15 MIL/uL (ref 3.87–5.11)
RDW: 14.4 % (ref 11.5–15.5)
WBC: 10 10*3/uL (ref 4.0–10.5)

## 2014-12-13 LAB — POC URINE PREG, ED: PREG TEST UR: NEGATIVE

## 2014-12-13 MED ORDER — MORPHINE SULFATE 4 MG/ML IJ SOLN
4.0000 mg | Freq: Once | INTRAMUSCULAR | Status: AC
  Administered 2014-12-14: 4 mg via INTRAVENOUS
  Filled 2014-12-13: qty 1

## 2014-12-13 NOTE — ED Notes (Signed)
Patient unable to tolerate blood draw. Unsuccessful lab stick.

## 2014-12-13 NOTE — ED Notes (Addendum)
Patent c/o abd pain. Patient states pain came on suddenly yesterday morning. Patient describes pain as "cramping & pressure", denies relief with position changes. Patient states in her pelvic area and radiates towards her abd bilaterally. Patient states she took ibuprofen without relief. Patient states she took ibuprofen with slight relief. Denies N/V/D.

## 2014-12-13 NOTE — ED Provider Notes (Signed)
CSN: [id number]     Arrival date & time 12/13/14  1858 History   First MD Initiated Contact with Patient 12/13/14 2248     Chief Complaint  Patient presents with  . Abdominal Pain     (Consider location/radiation/quality/duration/timing/severity/associated sxs/prior Treatment) Patient is a 44 y.o. female presenting with abdominal pain. The history is provided by the patient and medical records. No language interpreter was used.  Abdominal Pain Associated symptoms: no chest pain, no constipation, no cough, no diarrhea, no dysuria, no fatigue, no fever, no hematuria, no nausea, no shortness of breath and no vomiting      Hadleigh Felber is a 44 y.o. female  with a hx of HTN, NIDDM, fibroid tumors presents to the Emergency Department complaining of sudden, persistent, progressively worsening lower abd pain onset 2 days ago.  Pt reports pain is 7/10 and it is described as a stabbing, twisting pain.  Pt reports everything makes her stomach hurt including walking, moving, deep breathing.  Pt reports taking ibuprofen without relief.  Pt reports she had similar pain with her fibroid tumors but this pain resolved after her novasure ablation (2013).  Pt reports she had a light period in [facility] January, but does not menstruate every month after her ablation.  Nothing makes her symptoms.  Pt denies fever, chills, headache, neck pain, chest pain, SOB, nausea, vomiting, diarrhea, weakness, dizziness, syncope, dysuria, vaginal discharge.   Last BM this morning and normal.  Pt reports BM every day.     Past Medical History  Diagnosis Date  . Hypertension   . Diabetes mellitus without complication    Past Surgical History  Procedure Laterality Date  . Breast reduction surgery    . Cesarean section    . Tonsillectomy    . Adenoidectomy    . Nasal septum surgery    . Novasure ablation     History reviewed. No pertinent family history. History  Substance Use Topics  . Smoking status: Former Smoker -- 0.00  packs/day    Quit date: 07/17/2014  . Smokeless tobacco: Never Used  . Alcohol Use: No     Comment: occ   OB History    No data available     Review of Systems  Constitutional: Negative for fever, diaphoresis, appetite change, fatigue and unexpected weight change.  HENT: Negative for mouth sores and trouble swallowing.   Respiratory: Negative for cough, chest tightness, shortness of breath, wheezing and stridor.   Cardiovascular: Negative for chest pain and palpitations.  Gastrointestinal: Positive for abdominal pain. Negative for nausea, vomiting, diarrhea, constipation, blood in stool, abdominal distention and rectal pain.  Genitourinary: Positive for pelvic pain. Negative for dysuria, urgency, frequency, hematuria, flank pain and difficulty urinating.  Musculoskeletal: Negative for back pain, neck pain and neck stiffness.  Skin: Negative for rash.  Neurological: Negative for weakness.  Hematological: Negative for adenopathy.  Psychiatric/Behavioral: Negative for confusion.  All other systems reviewed and are negative.     Allergies  Codeine and Flagyl  Home Medications   Prior to Admission medications   Medication Sig Start Date End Date Taking? Authorizing Provider  Aspirin-Salicylamide-Caffeine (BC HEADACHE POWDER PO) Take 1 Package by mouth every 6 (six) hours as needed (pain).   Yes Historical Provider, MD  ibuprofen (ADVIL,MOTRIN) 200 MG tablet Take 800 mg by mouth every 8 (eight) hours as needed. For pain.   Yes Historical Provider, MD  losartan-hydrochlorothiazide (HYZAAR) 50-12.5 MG per tablet Take 1 tablet by mouth daily.   Yes Historical  Provider, MD  metFORMIN (GLUCOPHAGE) 500 MG tablet Take 500 mg by mouth daily with breakfast.    Yes Historical Provider, MD  ibuprofen (ADVIL,MOTRIN) 800 MG tablet Take 1 tablet (800 mg total) by mouth 3 (three) times daily. Patient not taking: Reported on 10/14/2014 05/08/14   Carlisle Cater, PA-C   BP 119/70 mmHg  Pulse 114   Temp(Src) 99.2 F (37.3 C) (Oral)  Resp 18  Ht 5\' 1"  (1.549 m)  Wt 270 lb (122.471 kg)  BMI 51.04 kg/m2  SpO2 94% Physical Exam  Constitutional: She appears well-developed and well-nourished. No distress.  HENT:  Head: Normocephalic and atraumatic.  Mouth/Throat: Oropharynx is clear and moist.  Eyes: Conjunctivae are normal. No scleral icterus.  Neck: Normal range of motion.  Cardiovascular: Normal rate, regular rhythm, normal heart sounds and intact distal pulses.   No murmur heard. Pulmonary/Chest: Effort normal and breath sounds normal. No respiratory distress. She has no wheezes.  Abdominal: Soft. Bowel sounds are normal. She exhibits no distension and no mass. There is generalized tenderness. There is guarding. There is no rebound and no CVA tenderness. Hernia confirmed negative in the right inguinal area and confirmed negative in the left inguinal area.  General abd tenderness with guarding but no rebound No CVA tenderness  Genitourinary: Uterus normal. No labial fusion. There is no rash, tenderness or lesion on the right labia. There is no rash, tenderness or lesion on the left labia. Uterus is not deviated, not enlarged, not fixed and not tender. Cervix exhibits no motion tenderness, no discharge and no friability. Right adnexum displays tenderness. Right adnexum displays no mass and no fullness. Left adnexum displays tenderness. Left adnexum displays no mass and no fullness. There is tenderness in the vagina. No erythema or bleeding in the vagina. No foreign body around the vagina. No signs of injury around the vagina. Vaginal discharge (moderate, white, nonodorous) found.  Pelvic exam with general vaginal and adnexal tenderness No specific cervical motion tenderness  Musculoskeletal: Normal range of motion. She exhibits no edema.  Lymphadenopathy:       Right: No inguinal adenopathy present.       Left: No inguinal adenopathy present.  Neurological: She is alert.  Skin: Skin is  warm and dry. She is not diaphoretic. No erythema.  Psychiatric: She has a normal mood and affect.  Nursing note and vitals reviewed.   ED Course  Procedures (including critical care time) Labs Review Labs Reviewed  WET PREP, GENITAL - Abnormal; Notable for the following:    Trich, Wet Prep FEW (*)    WBC, Wet Prep HPF POC RARE (*)    All other components within normal limits  URINALYSIS, ROUTINE W REFLEX MICROSCOPIC - Abnormal; Notable for the following:    APPearance CLOUDY (*)    Hgb urine dipstick SMALL (*)    Leukocytes, UA SMALL (*)    All other components within normal limits  COMPREHENSIVE METABOLIC PANEL - Abnormal; Notable for the following:    Potassium 3.3 (*)    Glucose, Bld 185 (*)    Albumin 3.4 (*)    All other components within normal limits  CBC WITH DIFFERENTIAL/PLATELET - Abnormal; Notable for the following:    Hemoglobin 11.3 (*)    HCT 35.5 (*)    Neutrophils Relative % 79 (*)    Neutro Abs 7.9 (*)    All other components within normal limits  URINE MICROSCOPIC-ADD ON - Abnormal; Notable for the following:    Squamous Epithelial /  LPF MANY (*)    Bacteria, UA FEW (*)    All other components within normal limits  LIPASE, BLOOD  HIV ANTIBODY (ROUTINE TESTING)  POC URINE PREG, ED  GC/CHLAMYDIA PROBE AMP ([place])    Imaging Review No results found.   EKG Interpretation None      MDM   Final diagnoses:  Abdominal pain, acute  Abdominal pain, acute   Courtney Bellizzi presents with lower abdominal pain, described as pressure however on exam patient with generalized abdominal tenderness and guarding throughout. No rebound signs. No CVA tenderness.    12:45PM CBC with white blood cell count of 10, mild hypokalemia 3.3. Urinalysis contaminated with 11-20 white blood cells. Patient without urinary symptoms.  Pelvic exam with a general vaginal tenderness and adnexal tenderness without palpable mass. No specific cervical motion tenderness.  1:18  AM Pt continues to have significant pain.  Abdomen remains tender to palpation with guarding but without rebound.  No abdominal rigidity.  CT scan pending.  Pt Care transferred to [facility], PA-C.  If CT scan is negative, she will need pelvic US to r/o torsion.  Pain medication redosed and fluid bolus ordered.    Jarrett Soho Dwon Sky, PA-C 12/14/14 0124  Everlene Balls, MD 12/14/14 [phone]

## 2014-12-14 ENCOUNTER — Emergency Department (HOSPITAL_COMMUNITY): Payer: Self-pay

## 2014-12-14 ENCOUNTER — Emergency Department (HOSPITAL_COMMUNITY): Payer: No Typology Code available for payment source

## 2014-12-14 LAB — WET PREP, GENITAL
Clue Cells Wet Prep HPF POC: NONE SEEN
Yeast Wet Prep HPF POC: NONE SEEN

## 2014-12-14 LAB — COMPREHENSIVE METABOLIC PANEL
ALT: 13 U/L (ref 0–35)
ANION GAP: 8 (ref 5–15)
AST: 13 U/L (ref 0–37)
Albumin: 3.4 g/dL — ABNORMAL LOW (ref 3.5–5.2)
Alkaline Phosphatase: 62 U/L (ref 39–117)
BUN: 7 mg/dL (ref 6–23)
CALCIUM: 8.8 mg/dL (ref 8.4–10.5)
CO2: 27 mmol/L (ref 19–32)
CREATININE: 0.62 mg/dL (ref 0.50–1.10)
Chloride: 100 mmol/L (ref 96–112)
Glucose, Bld: 185 mg/dL — ABNORMAL HIGH (ref 70–99)
Potassium: 3.3 mmol/L — ABNORMAL LOW (ref 3.5–5.1)
SODIUM: 135 mmol/L (ref 135–145)
TOTAL PROTEIN: 7.3 g/dL (ref 6.0–8.3)
Total Bilirubin: 0.6 mg/dL (ref 0.3–1.2)

## 2014-12-14 LAB — URINE MICROSCOPIC-ADD ON

## 2014-12-14 LAB — URINALYSIS, ROUTINE W REFLEX MICROSCOPIC
Bilirubin Urine: NEGATIVE
Glucose, UA: NEGATIVE mg/dL
KETONES UR: NEGATIVE mg/dL
NITRITE: NEGATIVE
PH: 5 (ref 5.0–8.0)
Protein, ur: NEGATIVE mg/dL
Specific Gravity, Urine: 1.025 (ref 1.005–1.030)
Urobilinogen, UA: 1 mg/dL (ref 0.0–1.0)

## 2014-12-14 LAB — LIPASE, BLOOD: LIPASE: 17 U/L (ref 11–59)

## 2014-12-14 MED ORDER — IOHEXOL 300 MG/ML  SOLN
50.0000 mL | Freq: Once | INTRAMUSCULAR | Status: AC | PRN
  Administered 2014-12-14: 50 mL via ORAL

## 2014-12-14 MED ORDER — SODIUM CHLORIDE 0.9 % IV BOLUS (SEPSIS)
1000.0000 mL | Freq: Once | INTRAVENOUS | Status: AC
  Administered 2014-12-14: 1000 mL via INTRAVENOUS

## 2014-12-14 MED ORDER — IOHEXOL 300 MG/ML  SOLN
100.0000 mL | Freq: Once | INTRAMUSCULAR | Status: AC | PRN
  Administered 2014-12-14: 100 mL via INTRAVENOUS

## 2014-12-14 MED ORDER — HYDROCODONE-ACETAMINOPHEN 5-325 MG PO TABS: 1.0000 | ORAL_TABLET | ORAL | Status: DC | PRN

## 2014-12-14 MED ORDER — HYDROMORPHONE HCL 1 MG/ML IJ SOLN
1.0000 mg | Freq: Once | INTRAMUSCULAR | Status: AC
  Administered 2014-12-14: 1 mg via INTRAVENOUS
  Filled 2014-12-14: qty 1

## 2014-12-14 NOTE — ED Notes (Signed)
Awake. Verbally responsive. Resp even and unlabored. No audible adventitious breath sounds noted. ABC's intact. Abd soft/nondistended but tender to palpate. BS (+) and active x4 quadrants. No N/V/D reported. 

## 2014-12-14 NOTE — ED Provider Notes (Signed)
Abd pain x 2 days - no N, V, D, fever, dysuria UA - contaminated - no dysuria Pending CT   Re-eval:  Pain is controlled. CT abd/pel and pelvic US showing uterine fibroid and left ovarian cyst. Discussed return precautions and PCP follow up.   Dewaine Oats, PA-C 12/14/14 0440  Everlene Balls, MD 12/14/14 0700

## 2014-12-14 NOTE — ED Notes (Signed)
Resting quietly with eye closed. Easily arousable. Verbally responsive. Resp even and unlabored. ABC's intact.

## 2014-12-14 NOTE — ED Notes (Signed)
US at bedside

## 2014-12-14 NOTE — Discharge Instructions (Signed)

## 2014-12-15 LAB — GC/CHLAMYDIA PROBE AMP (~~LOC~~) NOT AT ARMC
CHLAMYDIA, DNA PROBE: NEGATIVE
Neisseria Gonorrhea: NEGATIVE

## 2014-12-15 LAB — HIV ANTIBODY (ROUTINE TESTING W REFLEX): HIV SCREEN 4TH GENERATION: NONREACTIVE

## 2015-04-06 ENCOUNTER — Emergency Department (HOSPITAL_COMMUNITY)
Admission: EM | Admit: 2015-04-06 | Discharge: 2015-04-06 | Disposition: A | Discharge status: Home / Self Care | Payer: Medicaid Other | Attending: Emergency Medicine | Admitting: Emergency Medicine

## 2015-04-06 ENCOUNTER — Encounter (HOSPITAL_COMMUNITY): Payer: Self-pay

## 2015-04-06 ENCOUNTER — Emergency Department (HOSPITAL_COMMUNITY): Payer: Medicaid Other

## 2015-04-06 DIAGNOSIS — Z79899 Other long term (current) drug therapy: Secondary | ICD-10-CM | POA: Insufficient documentation

## 2015-04-06 DIAGNOSIS — Z87891 Personal history of nicotine dependence: Secondary | ICD-10-CM | POA: Insufficient documentation

## 2015-04-06 DIAGNOSIS — J069 Acute upper respiratory infection, unspecified: Secondary | ICD-10-CM | POA: Diagnosis not present

## 2015-04-06 DIAGNOSIS — R0789 Other chest pain: Secondary | ICD-10-CM | POA: Diagnosis not present

## 2015-04-06 DIAGNOSIS — R1110 Vomiting, unspecified: Secondary | ICD-10-CM | POA: Insufficient documentation

## 2015-04-06 DIAGNOSIS — I10 Essential (primary) hypertension: Secondary | ICD-10-CM | POA: Diagnosis not present

## 2015-04-06 DIAGNOSIS — E119 Type 2 diabetes mellitus without complications: Secondary | ICD-10-CM | POA: Diagnosis not present

## 2015-04-06 DIAGNOSIS — J9801 Acute bronchospasm: Secondary | ICD-10-CM | POA: Diagnosis not present

## 2015-04-06 DIAGNOSIS — R05 Cough: Secondary | ICD-10-CM | POA: Diagnosis present

## 2015-04-06 DIAGNOSIS — R000 Tachycardia, unspecified: Secondary | ICD-10-CM | POA: Diagnosis not present

## 2015-04-06 MED ORDER — ALBUTEROL SULFATE HFA 108 (90 BASE) MCG/ACT IN AERS
2.0000 | INHALATION_SPRAY | RESPIRATORY_TRACT | Status: DC | PRN
  Administered 2015-04-06: 2 via RESPIRATORY_TRACT
  Filled 2015-04-06: qty 6.7

## 2015-04-06 MED ORDER — HYDROCOD POLST-CPM POLST ER 10-8 MG/5ML PO SUER: 5.0000 mL | Freq: Two times a day (BID) | ORAL | Status: DC | PRN

## 2015-04-06 MED ORDER — AMOXICILLIN 500 MG PO CAPS
1000.0000 mg | ORAL_CAPSULE | Freq: Once | ORAL | Status: AC
  Administered 2015-04-06: 1000 mg via ORAL
  Filled 2015-04-06: qty 2

## 2015-04-06 MED ORDER — AMOXICILLIN 500 MG PO CAPS: 1000.0000 mg | ORAL_CAPSULE | Freq: Two times a day (BID) | ORAL | Status: DC

## 2015-04-06 NOTE — Discharge Instructions (Signed)
Bronchospasm A bronchospasm is a spasm or tightening of the airways going into the lungs. During a bronchospasm breathing becomes more difficult because the airways get smaller. When this happens there can be coughing, a whistling sound when breathing (wheezing), and difficulty breathing. Bronchospasm is often associated with asthma, but not all patients who experience a bronchospasm have asthma. CAUSES  A bronchospasm is caused by inflammation or irritation of the airways. The inflammation or irritation may be triggered by:   Allergies (such as to animals, pollen, food, or mold). Allergens that cause bronchospasm may cause wheezing immediately after exposure or many hours later.   Infection. Viral infections are believed to be the most common cause of bronchospasm.   Exercise.   Irritants (such as pollution, cigarette smoke, strong odors, aerosol sprays, and paint fumes).   Weather changes. Winds increase molds and pollens in the air. Rain refreshes the air by washing irritants out. Cold air may cause inflammation.   Stress and emotional upset.  SIGNS AND SYMPTOMS   Wheezing.   Excessive nighttime coughing.   Frequent or severe coughing with a simple cold.   Chest tightness.   Shortness of breath.  DIAGNOSIS  Bronchospasm is usually diagnosed through a history and physical exam. Tests, such as chest X-rays, are sometimes done to look for other conditions. TREATMENT   Inhaled medicines can be given to open up your airways and help you breathe. The medicines can be given using either an inhaler or a nebulizer machine.  Corticosteroid medicines may be given for severe bronchospasm, usually when it is associated with asthma. HOME CARE INSTRUCTIONS   Always have a plan prepared for seeking medical care. Know when to call your health care provider and local emergency services (911 in the [place] Know where you can access local emergency care.  Only take medicines as  directed by your health care provider.  If you were prescribed an inhaler or nebulizer machine, ask your health care provider to explain how to use it correctly. Always use a spacer with your inhaler if you were given one.  It is necessary to remain calm during an attack. Try to relax and breathe more slowly.  Control your home environment in the following ways:   Change your heating and air conditioning filter at least once a month.   Limit your use of fireplaces and wood stoves.  Do not smoke and do not allow smoking in your home.   Avoid exposure to perfumes and fragrances.   Get rid of pests (such as roaches and mice) and their droppings.   Throw away plants if you see mold on them.   Keep your house clean and dust free.   Replace carpet with wood, tile, or vinyl flooring. Carpet can trap dander and dust.   Use allergy-proof pillows, mattress covers, and box spring covers.   Wash bed sheets and blankets every week in hot water and dry them in a dryer.   Use blankets that are made of polyester or cotton.   Wash hands frequently. SEEK MEDICAL CARE IF:   You have muscle aches.   You have chest pain.   The sputum changes from clear or white to yellow, green, gray, or bloody.   The sputum you cough up gets thicker.   There are problems that may be related to the medicine you are given, such as a rash, itching, swelling, or trouble breathing.  SEEK IMMEDIATE MEDICAL CARE IF:   You have worsening wheezing and coughing even  after taking your prescribed medicines.   You have increased difficulty breathing.   You develop severe chest pain. MAKE SURE YOU:   Understand these instructions.  Will watch your condition.  Will get help right away if you are not doing well or get worse. Document Released: 10/06/2003 Document Revised: 10/08/2013 Document Reviewed: 03/25/2013 [facility] Patient Information 2015 [place], [place]. This information is not  intended to replace advice given to you by your health care provider. Make sure you discuss any questions you have with your health care provider. Cough, Adult  A cough is a reflex that helps clear your throat and airways. It can help heal the body or may be a reaction to an irritated airway. A cough may only last 2 or 3 weeks (acute) or may last more than 8 weeks (chronic).  CAUSES Acute cough:  Viral or bacterial infections. Chronic cough:  Infections.  Allergies.  Asthma.  Post-nasal drip.  Smoking.  Heartburn or acid reflux.  Some medicines.  Chronic lung problems (COPD).  Cancer. SYMPTOMS   Cough.  Fever.  Chest pain.  Increased breathing rate.  High-pitched whistling sound when breathing (wheezing).  Colored mucus that you cough up (sputum). TREATMENT   A bacterial cough may be treated with antibiotic medicine.  A viral cough must run its course and will not respond to antibiotics.  Your caregiver may recommend other treatments if you have a chronic cough. HOME CARE INSTRUCTIONS   Only take over-the-counter or prescription medicines for pain, discomfort, or fever as directed by your caregiver. Use cough suppressants only as directed by your caregiver.  Use a cold steam vaporizer or humidifier in your bedroom or home to help loosen secretions.  Sleep in a semi-upright position if your cough is worse at night.  Rest as needed.  Stop smoking if you smoke. SEEK IMMEDIATE MEDICAL CARE IF:   You have pus in your sputum.  Your cough starts to worsen.  You cannot control your cough with suppressants and are losing sleep.  You begin coughing up blood.  You have difficulty breathing.  You develop pain which is getting worse or is uncontrolled with medicine.  You have a fever. MAKE SURE YOU:   Understand these instructions.  Will watch your condition.  Will get help right away if you are not doing well or get worse. Document Released:  04/01/2011 Document Revised: 12/26/2011 Document Reviewed: 04/01/2011 [facility] Patient Information 2015 [place], [place]. This information is not intended to replace advice given to you by your health care provider. Make sure you discuss any questions you have with your health care provider.

## 2015-04-06 NOTE — ED Notes (Signed)
Pt c/o nonproductive cough and congestion x 1.5 weeks.  Pt reports taking OTC medications w/o relief.  Pt was seen by PCP x 5 days ago and prescribed Tessalon pearls.  Pt has been taking them w/o relief.

## 2015-04-06 NOTE — ED Provider Notes (Signed)
CSN: [id number]     Arrival date & time 04/06/15  1845 History  This chart was scribed for non-physician practitioner Charlann Lange, PA-C working with Orpah Greek, MD by Meriel Pica, ED Scribe. This patient was seen in room WTR5/WTR5 and the patient's care was started at 8:26 PM.   Chief Complaint  Patient presents with  . Cough  . Nasal Congestion   The history is provided by the patient. No language interpreter was used.   HPI Comments: Sara Chan is a 44 y.o. female, with a PMhx of HTN and DM, who presents to the Emergency Department complaining of a constant, moderate cough that began 2 weeks ago with associated congestion and post-tussive emesis. Pt was seen by her PCP Nolene Ebbs 5 days ago when she was prescribed Tessalon pearls which she has been compliant with, but has not felt relief from her symptoms. Pt did not receive a  Chest X-ray at during this visit to her PCP or a prescription for antibiotic treatment. She states when she was seen by her PCP her BG was normal, pt is on insulin once a week and otherwise takes metformin 500mg  BID. BP is currently 160/84mmHg. She denies a history of asthma or inhaler use and additionally denies fever and nausea.    Past Medical History  Diagnosis Date  . Hypertension   . Diabetes mellitus without complication    Past Surgical History  Procedure Laterality Date  . Breast reduction surgery    . Cesarean section    . Tonsillectomy    . Adenoidectomy    . Nasal septum surgery    . Novasure ablation     History reviewed. No pertinent family history. History  Substance Use Topics  . Smoking status: Former Smoker -- 0.00 packs/day    Quit date: 07/17/2014  . Smokeless tobacco: Never Used  . Alcohol Use: No     Comment: occ   OB History    No data available     Review of Systems  Constitutional: Negative for fever.  HENT: Positive for congestion.   Respiratory: Positive for cough.   Gastrointestinal: Positive for  vomiting. Negative for nausea.       Vomiting is post-tussive,.   Allergies  Codeine and Flagyl  Home Medications   Prior to Admission medications   Medication Sig Start Date End Date Taking? Authorizing Provider  Aspirin-Salicylamide-Caffeine (BC HEADACHE POWDER PO) Take 1 Package by mouth every 6 (six) hours as needed (pain).    Historical Provider, MD  HYDROcodone-acetaminophen (NORCO/VICODIN) 5-325 MG per tablet Take 1-2 tablets by mouth every 4 (four) hours as needed. 12/14/14   Charlann Lange, PA-C  ibuprofen (ADVIL,MOTRIN) 200 MG tablet Take 800 mg by mouth every 8 (eight) hours as needed. For pain.    Historical Provider, MD  ibuprofen (ADVIL,MOTRIN) 800 MG tablet Take 1 tablet (800 mg total) by mouth 3 (three) times daily. Patient not taking: Reported on 10/14/2014 05/08/14   Carlisle Cater, PA-C  losartan-hydrochlorothiazide (HYZAAR) 50-12.5 MG per tablet Take 1 tablet by mouth daily.    Historical Provider, MD  metFORMIN (GLUCOPHAGE) 500 MG tablet Take 500 mg by mouth daily with breakfast.     Historical Provider, MD   BP 160/90 mmHg  Pulse 125  Temp(Src) 98.9 F (37.2 C) (Oral)  Resp 18  SpO2 98% Physical Exam  Constitutional: She is oriented to person, place, and time. She appears well-developed and well-nourished. No distress.  HENT:  Head: Normocephalic.  Mouth/Throat: Oropharynx is  clear and moist.  Eyes: Conjunctivae are normal.  Neck: Normal range of motion.  Cardiovascular: Regular rhythm and normal heart sounds.   Tachycardic, HR 100  Pulmonary/Chest: Effort normal and breath sounds normal. No respiratory distress. She has no wheezes. She has no rales. She exhibits tenderness.  Right chest wall tenderness.   Abdominal: Soft. There is no tenderness.  Musculoskeletal: Normal range of motion.  Neurological: She is alert and oriented to person, place, and time.  Skin: Skin is warm. No rash noted.  Psychiatric: She has a normal mood and affect. Her behavior is  normal.  Nursing note and vitals reviewed.   ED Course  Procedures  DIAGNOSTIC STUDIES: Oxygen Saturation is 98% on RA, normal by my interpretation.    COORDINATION OF CARE: 8:30 PM Discussed treatment plan which includes to start on Amoxicillin course. Will prescribe albuterol inhaler and cough medication. Will also order chest X-ray. Pt acknowledges and agrees to plan.   Labs Review Labs Reviewed - No data to display  Imaging Review Dg Chest 2 View (if Patient Has Fever And/or Copd)  04/06/2015   CLINICAL DATA:  44 year old female with history of nonproductive coughing congestion for the past 1.5 weeks.  EXAM: CHEST  2 VIEW  COMPARISON:  Chest x-ray 12/16/2011.  FINDINGS: Mild diffuse peribronchial cuffing. Lung volumes are normal. No consolidative airspace disease. No pleural effusions. No pneumothorax. No pulmonary nodule or mass noted. Pulmonary vasculature and the cardiomediastinal silhouette are within normal limits.  IMPRESSION: 1. Mild diffuse peribronchial cuffing, suggestive of an acute bronchitis.   Electronically Signed   By: Vinnie Langton M.D.   On: 04/06/2015 19:25     EKG Interpretation None      MDM   Final diagnoses:  None    1. URI 2. Bronchospasm  The patient has had symptoms for 2 weeks, progressively worsening. No hypoxia. Recheck pulse rate = 100. She is in NAD. Will treat with antibiotics based on duration of symptoms. Recommended recheck with PCP if she does not improve.   I personally performed the services described in this documentation, which was scribed in my presence. The recorded information has been reviewed and is accurate.     Charlann Lange, PA-C 04/07/15 5038  Orpah Greek, MD 04/13/15 [phone]

## 2015-04-06 NOTE — ED Notes (Signed)
Pt reports non-productive cough and congestion x 2 weeks.

## 2015-09-03 ENCOUNTER — Encounter (HOSPITAL_COMMUNITY): Payer: Self-pay

## 2015-09-03 ENCOUNTER — Emergency Department (HOSPITAL_COMMUNITY): Payer: Medicaid Other

## 2015-09-03 ENCOUNTER — Emergency Department (HOSPITAL_COMMUNITY)
Admission: EM | Admit: 2015-09-03 | Discharge: 2015-09-03 | Disposition: A | Discharge status: Home / Self Care | Payer: Medicaid Other | Attending: Emergency Medicine | Admitting: Emergency Medicine

## 2015-09-03 DIAGNOSIS — M25562 Pain in left knee: Secondary | ICD-10-CM | POA: Diagnosis not present

## 2015-09-03 DIAGNOSIS — Z87891 Personal history of nicotine dependence: Secondary | ICD-10-CM | POA: Diagnosis not present

## 2015-09-03 DIAGNOSIS — Z7984 Long term (current) use of oral hypoglycemic drugs: Secondary | ICD-10-CM | POA: Diagnosis not present

## 2015-09-03 DIAGNOSIS — Z79899 Other long term (current) drug therapy: Secondary | ICD-10-CM | POA: Diagnosis not present

## 2015-09-03 DIAGNOSIS — I10 Essential (primary) hypertension: Secondary | ICD-10-CM | POA: Insufficient documentation

## 2015-09-03 DIAGNOSIS — M25569 Pain in unspecified knee: Secondary | ICD-10-CM

## 2015-09-03 DIAGNOSIS — Z791 Long term (current) use of non-steroidal anti-inflammatories (NSAID): Secondary | ICD-10-CM | POA: Diagnosis not present

## 2015-09-03 DIAGNOSIS — E119 Type 2 diabetes mellitus without complications: Secondary | ICD-10-CM | POA: Insufficient documentation

## 2015-09-03 MED ORDER — HYDROCODONE-ACETAMINOPHEN 5-325 MG PO TABS: ORAL_TABLET | ORAL | Status: DC

## 2015-09-03 NOTE — ED Notes (Signed)
Pt complaining of left knee pain starting about a month ago. States that pain is a 10/10 when she is ambulating. At night, she says her leg feels "like a toothache." Hx of diabetes.

## 2015-09-03 NOTE — ED Provider Notes (Signed)
CSN: [id number]     Arrival date & time 09/03/15  1726 History  By signing my name below, I, Hansel Feinstein, attest that this documentation has been prepared under the direction and in the presence of [employer], PA-C.  Electronically Signed: Hansel Feinstein, ED Scribe. 09/03/2015. 6:06 PM.    Chief Complaint  Patient presents with  . Knee Pain   The history is provided by the patient. No language interpreter was used.    HPI Comments: Sara Chan is a 44 y.o. female with h/o DM, HTN who presents to the Emergency Department complaining of moderate, burning left knee pain for a month. Pt states that pain is worsened with ambulation and radiates to the plantar aspect of her foot. Pt states that she has taken Tylenol Arthritis, arthritis topical ointment, ibuprofen, motrin, arthritis BCs and Meloxicam with little to no relief. No recent injuries or trauma to the knee. H/o arthritis in the right knee. Her last A1C was elevated. Pt is not followed by an orthopedist.   Past Medical History  Diagnosis Date  . Hypertension   . Diabetes mellitus without complication [facility])    Past Surgical History  Procedure Laterality Date  . Breast reduction surgery    . Cesarean section    . Tonsillectomy    . Adenoidectomy    . Nasal septum surgery    . Novasure ablation     History reviewed. No pertinent family history. Social History  Substance Use Topics  . Smoking status: Former Smoker -- 0.00 packs/day    Quit date: 07/17/2014  . Smokeless tobacco: Never Used  . Alcohol Use: No     Comment: occ   OB History    No data available     Review of Systems A complete 10 system review of systems was obtained and all systems are negative except as noted in the HPI and PMH.    Allergies  Codeine and Flagyl  Home Medications   Prior to Admission medications   Medication Sig Start Date End Date Taking? Authorizing Provider  amoxicillin (AMOXIL) 500 MG capsule Take 2 capsules (1,000 mg total) by  mouth 2 (two) times daily. 04/06/15   Charlann Lange, PA-C  Chlorphen-Phenyleph-ASA (ALKA-SELTZER PLUS COLD PO) Take 1 tablet by mouth every 6 (six) hours as needed (cold and cough symptoms).    Historical Provider, MD  chlorpheniramine-HYDROcodone (TUSSIONEX PENNKINETIC ER) 10-8 MG/5ML SUER Take 5 mLs by mouth every 12 (twelve) hours as needed for cough. 04/06/15   Charlann Lange, PA-C  dextromethorphan-guaiFENesin (MUCINEX DM) 30-600 MG per 12 hr tablet Take 1 tablet by mouth every 4 (four) hours as needed for cough (cough).    Historical Provider, MD  guaiFENesin-dextromethorphan (ROBITUSSIN DM) 100-10 MG/5ML syrup Take 5 mLs by mouth every 4 (four) hours as needed for cough (cough).    Historical Provider, MD  HYDROcodone-acetaminophen (NORCO/VICODIN) 5-325 MG per tablet Take 1-2 tablets by mouth every 4 (four) hours as needed. Patient not taking: Reported on 04/06/2015 12/14/14   Charlann Lange, PA-C  ibuprofen (ADVIL,MOTRIN) 800 MG tablet Take 1 tablet (800 mg total) by mouth 3 (three) times daily. Patient not taking: Reported on 10/14/2014 05/08/14   Carlisle Cater, PA-C  losartan-hydrochlorothiazide (HYZAAR) 100-12.5 MG per tablet Take 1 tablet by mouth daily. 03/04/15   Historical Provider, MD  meloxicam (MOBIC) 7.5 MG tablet TAKE 1 TABLET BY MOUTH TWICE A DAY WITH FOOD AS NEEDED FOR PAIN 03/04/15   Historical Provider, MD  metFORMIN (GLUCOPHAGE) 500 MG tablet  Take 500 mg by mouth 2 (two) times daily with a meal.     Historical Provider, MD  naproxen sodium (ANAPROX) 220 MG tablet Take 440 mg by mouth daily as needed (pain).    Historical Provider, MD  simvastatin (ZOCOR) 10 MG tablet Take 10 mg by mouth at bedtime. 03/04/15   Historical Provider, MD   BP 148/106 mmHg  Pulse 104  Temp(Src) 98.6 F (37 C) (Oral)  Resp 18  Ht 5\' 1"  (1.549 m)  Wt 273 lb (123.832 kg)  BMI 51.61 kg/m2  SpO2 99% Physical Exam  Constitutional: She is oriented to person, place, and time. She appears well-developed and  well-nourished. No distress.  Morbid obesity  HENT:  Head: Normocephalic.  Eyes: Conjunctivae and EOM are normal.  Cardiovascular: Normal rate.   Pulmonary/Chest: Effort normal. No stridor.  Musculoskeletal: Normal range of motion.  Left knee:  No deformity, erythema or abrasions. FROM. No effusion. ++crepitance. Anterior and posterior drawer show no abnormal laxity. Stable to valgus and varus stress. Joint lines are non-tender. Neurovascularly intact. Pt ambulates with antalgic gait.    Neurological: She is alert and oriented to person, place, and time.  Psychiatric: She has a normal mood and affect.  Nursing note and vitals reviewed.   ED Course  Procedures (including critical care time) DIAGNOSTIC STUDIES: Oxygen Saturation is 99% on RA, normal by my interpretation.    COORDINATION OF CARE: 6:06 PM Discussed treatment plan with pt at bedside and pt agreed to plan.   MDM   Final diagnoses:  Knee pain, unspecified laterality   Filed Vitals:   09/03/15 1735  BP: 148/106  Pulse: 104  Temp: 98.6 F (37 C)  TempSrc: Oral  Resp: 18  Height: 5\' 1"  (1.549 m)  Weight: 273 lb (123.832 kg)  SpO2: 99%     Sara Chan is 44 y.o. female presenting with left knee pain, atraumatic over the course of one month. She's been taking Tylenol and meloxicam at home with little relief. Has not seen orthopedist. No signs of joint infection. Likely progression of her arthritis. Patient is given a short prescription of Vicodin. Orthopedic referral given.  Evaluation does not show pathology that would require ongoing emergent intervention or inpatient treatment. Pt is hemodynamically stable and mentating appropriately. Discussed findings and plan with patient/guardian, who agrees with care plan. All questions answered. Return precautions discussed and outpatient follow up given.   New Prescriptions   HYDROCODONE-ACETAMINOPHEN (NORCO/VICODIN) 5-325 MG TABLET    Take 1-2 tablets by mouth every 6  hours as needed for pain and/or cough.     I personally performed the services described in this documentation, which was scribed in my presence. The recorded information has been reviewed and is accurate.     Monico Blitz, PA-C 09/03/15 1823  Blanchie Dessert, MD 09/03/15 [phone]

## 2015-09-03 NOTE — Discharge Instructions (Signed)
Rest, Ice intermittently (in the first 24-48 hours), Gentle compression with an Ace wrap, and elevate (Limb above the level of the heart) °  °Take up to 800mg of ibuprofen (that is usually 4 over the counter pills)  3 times a day for 5 days. Take with food. ° °Take vicodin for breakthrough pain, do not drink alcohol, drive, care for children or do other critical tasks while taking vicodin. ° °Please follow with your primary care doctor in the next 2 days for a check-up. They must obtain records for further management.  ° °Do not hesitate to return to the Emergency Department for any new, worsening or concerning symptoms.  ° ° °

## 2015-09-03 NOTE — ED Notes (Signed)
Upon starting AVS explanation, pt says, "I can't take Vicodin, I don't like the way it makes me feel." Asked patient what pain medication works for her. Says, "I don't know, I don't know. I'll just get over the counter medications." Offered for PA to speak with patient about prescribing a medication that worked for her. Denies. Pt not looking at this writer when AVS is being explained. When asked if she wanted this writer to explain the AVS says, "No I already know what it says." Given follow up for orthopedic surgeon. Ambulatory with steady gait. No other c/c.

## 2016-02-27 ENCOUNTER — Encounter (HOSPITAL_COMMUNITY): Payer: Self-pay | Admitting: *Deleted

## 2016-02-27 ENCOUNTER — Emergency Department (HOSPITAL_COMMUNITY)
Admission: EM | Admit: 2016-02-27 | Discharge: 2016-02-27 | Disposition: A | Discharge status: Home / Self Care | Payer: Medicaid Other | Attending: Emergency Medicine | Admitting: Emergency Medicine

## 2016-02-27 DIAGNOSIS — B3731 Acute candidiasis of vulva and vagina: Secondary | ICD-10-CM

## 2016-02-27 DIAGNOSIS — B373 Candidiasis of vulva and vagina: Secondary | ICD-10-CM | POA: Insufficient documentation

## 2016-02-27 DIAGNOSIS — Z79899 Other long term (current) drug therapy: Secondary | ICD-10-CM | POA: Insufficient documentation

## 2016-02-27 DIAGNOSIS — Z3202 Encounter for pregnancy test, result negative: Secondary | ICD-10-CM | POA: Insufficient documentation

## 2016-02-27 DIAGNOSIS — M1990 Unspecified osteoarthritis, unspecified site: Secondary | ICD-10-CM | POA: Insufficient documentation

## 2016-02-27 DIAGNOSIS — Z792 Long term (current) use of antibiotics: Secondary | ICD-10-CM | POA: Insufficient documentation

## 2016-02-27 DIAGNOSIS — A5901 Trichomonal vulvovaginitis: Secondary | ICD-10-CM | POA: Insufficient documentation

## 2016-02-27 DIAGNOSIS — I10 Essential (primary) hypertension: Secondary | ICD-10-CM | POA: Insufficient documentation

## 2016-02-27 DIAGNOSIS — R739 Hyperglycemia, unspecified: Secondary | ICD-10-CM

## 2016-02-27 DIAGNOSIS — E1165 Type 2 diabetes mellitus with hyperglycemia: Secondary | ICD-10-CM | POA: Insufficient documentation

## 2016-02-27 DIAGNOSIS — Z7984 Long term (current) use of oral hypoglycemic drugs: Secondary | ICD-10-CM | POA: Insufficient documentation

## 2016-02-27 DIAGNOSIS — H9209 Otalgia, unspecified ear: Secondary | ICD-10-CM | POA: Insufficient documentation

## 2016-02-27 DIAGNOSIS — Z87891 Personal history of nicotine dependence: Secondary | ICD-10-CM | POA: Insufficient documentation

## 2016-02-27 DIAGNOSIS — R000 Tachycardia, unspecified: Secondary | ICD-10-CM

## 2016-02-27 HISTORY — DX: Unspecified osteoarthritis, unspecified site: M19.90

## 2016-02-27 LAB — URINALYSIS, ROUTINE W REFLEX MICROSCOPIC
BILIRUBIN URINE: NEGATIVE
Glucose, UA: 250 mg/dL — AB
KETONES UR: NEGATIVE mg/dL
Leukocytes, UA: NEGATIVE
Nitrite: NEGATIVE
PROTEIN: NEGATIVE mg/dL
Specific Gravity, Urine: 1.043 — ABNORMAL HIGH (ref 1.005–1.030)
pH: 5.5 (ref 5.0–8.0)

## 2016-02-27 LAB — WET PREP, GENITAL
CLUE CELLS WET PREP: NONE SEEN
Sperm: NONE SEEN
Yeast Wet Prep HPF POC: NONE SEEN

## 2016-02-27 LAB — CBG MONITORING, ED: Glucose-Capillary: 311 mg/dL — ABNORMAL HIGH (ref 65–99)

## 2016-02-27 LAB — URINE MICROSCOPIC-ADD ON: WBC, UA: NONE SEEN WBC/hpf (ref 0–5)

## 2016-02-27 LAB — POC URINE PREG, ED: PREG TEST UR: NEGATIVE

## 2016-02-27 MED ORDER — METRONIDAZOLE 500 MG PO TABS
2000.0000 mg | ORAL_TABLET | Freq: Once | ORAL | Status: AC
  Administered 2016-02-27: 2000 mg via ORAL
  Filled 2016-02-27: qty 4

## 2016-02-27 MED ORDER — FLUCONAZOLE 150 MG PO TABS: 150.0000 mg | ORAL_TABLET | Freq: Every day | ORAL | Status: AC

## 2016-02-27 MED ORDER — FLUCONAZOLE 150 MG PO TABS
150.0000 mg | ORAL_TABLET | Freq: Once | ORAL | Status: AC
  Administered 2016-02-27: 150 mg via ORAL
  Filled 2016-02-27: qty 1

## 2016-02-27 NOTE — ED Provider Notes (Signed)
CSN: [id number]     Arrival date & time 02/27/16  2026 History   First MD Initiated Contact with Patient 02/27/16 2105     Chief Complaint  Patient presents with  . Vaginitis     (Consider location/radiation/quality/duration/timing/severity/associated sxs/prior Treatment) HPI Sara Chan is a 45 y.o. female with history of hypertension diabetes, presents to emergency department complaining of vaginal discharge and itching. Patient states that she was recently diagnosed with ear infection and upper respiratory infection and was placed on prednisone and Augmentin. States that several days after starting Augmentin she developed vaginal discharge and itching. She stopped Augmentin. She states her ears feel better. She states she has tried Monistat, Azo, yogurt with no relief of her symptoms. She denies urinary symptoms. No fever or chills. No abdominal pain or back pain. No nausea or vomiting. She still has some nasal congestion but states she does have seasonal allergies and is currently taking allergy medications daily.  Past Medical History  Diagnosis Date  . Hypertension   . Diabetes mellitus without complication ([place])   . Arthritis    Past Surgical History  Procedure Laterality Date  . Breast reduction surgery    . Cesarean section    . Tonsillectomy    . Adenoidectomy    . Nasal septum surgery    . Novasure ablation     No family history on file. Social History  Substance Use Topics  . Smoking status: Former Smoker -- 0.00 packs/day    Quit date: 07/17/2014  . Smokeless tobacco: Never Used  . Alcohol Use: No     Comment: occ   OB History    No data available     Review of Systems  Constitutional: Negative for fever and chills.  HENT: Positive for congestion and ear pain.   Respiratory: Negative for cough, chest tightness and shortness of breath.   Cardiovascular: Negative for chest pain, palpitations and leg swelling.  Gastrointestinal: Negative for nausea, vomiting,  abdominal pain and diarrhea.  Genitourinary: Positive for vaginal discharge and vaginal pain. Negative for dysuria, flank pain, vaginal bleeding and pelvic pain.  Musculoskeletal: Negative for myalgias, arthralgias, neck pain and neck stiffness.  Skin: Negative for rash.  Neurological: Negative for dizziness, weakness and headaches.  All other systems reviewed and are negative.     Allergies  Codeine and Flagyl  Home Medications   Prior to Admission medications   Medication Sig Start Date End Date Taking? Authorizing Provider  amoxicillin (AMOXIL) 500 MG capsule Take 2 capsules (1,000 mg total) by mouth 2 (two) times daily. 04/06/15   Charlann Lange, PA-C  Chlorphen-Phenyleph-ASA (ALKA-SELTZER PLUS COLD PO) Take 1 tablet by mouth every 6 (six) hours as needed (cold and cough symptoms).    Historical Provider, MD  chlorpheniramine-HYDROcodone (TUSSIONEX PENNKINETIC ER) 10-8 MG/5ML SUER Take 5 mLs by mouth every 12 (twelve) hours as needed for cough. 04/06/15   Charlann Lange, PA-C  dextromethorphan-guaiFENesin (MUCINEX DM) 30-600 MG per 12 hr tablet Take 1 tablet by mouth every 4 (four) hours as needed for cough (cough).    Historical Provider, MD  guaiFENesin-dextromethorphan (ROBITUSSIN DM) 100-10 MG/5ML syrup Take 5 mLs by mouth every 4 (four) hours as needed for cough (cough).    Historical Provider, MD  HYDROcodone-acetaminophen (NORCO/VICODIN) 5-325 MG tablet Take 1-2 tablets by mouth every 6 hours as needed for pain and/or cough. 09/03/15   Nicole Pisciotta, PA-C  losartan-hydrochlorothiazide (HYZAAR) 100-12.5 MG per tablet Take 1 tablet by mouth daily. 03/04/15   Historical  Provider, MD  meloxicam (MOBIC) 7.5 MG tablet TAKE 1 TABLET BY MOUTH TWICE A DAY WITH FOOD AS NEEDED FOR PAIN 03/04/15   Historical Provider, MD  metFORMIN (GLUCOPHAGE) 500 MG tablet Take 500 mg by mouth 2 (two) times daily with a meal.     Historical Provider, MD  naproxen sodium (ANAPROX) 220 MG tablet Take 440 mg by  mouth daily as needed (pain).    Historical Provider, MD  simvastatin (ZOCOR) 10 MG tablet Take 10 mg by mouth at bedtime. 03/04/15   Historical Provider, MD   BP 133/92 mmHg  Pulse 120  Temp(Src) 98.1 F (36.7 C) (Oral)  Resp 20  SpO2 96% Physical Exam  Constitutional: She is oriented to person, place, and time. She appears well-developed and well-nourished. No distress.  HENT:  Head: Normocephalic.  Right Ear: Tympanic membrane, external ear and ear canal normal.  Left Ear: Tympanic membrane, external ear and ear canal normal.  Nose: Rhinorrhea present.  Mouth/Throat: Uvula is midline, oropharynx is clear and moist and mucous membranes are normal.  Eyes: Conjunctivae are normal.  Neck: Neck supple.  Cardiovascular: Regular rhythm and normal heart sounds.   Tachycardic  Pulmonary/Chest: Effort normal and breath sounds normal. No respiratory distress. She has no wheezes. She has no rales.  Abdominal: Soft. Bowel sounds are normal. She exhibits no distension. There is no tenderness. There is no rebound.  Genitourinary:  Normal external genitalia, erythema in the vaginal canal, white discharge present. No cervical motion tenderness. No suprapubic tenderness. No adnexal tenderness bilaterally.  Musculoskeletal: She exhibits no edema.  Neurological: She is alert and oriented to person, place, and time.  Skin: Skin is warm and dry.  Psychiatric: She has a normal mood and affect. Her behavior is normal.  Nursing note and vitals reviewed.   ED Course  Procedures (including critical care time) Labs Review Labs Reviewed  WET PREP, GENITAL - Abnormal; Notable for the following:    Trich, Wet Prep PRESENT (*)    WBC, Wet Prep HPF POC FEW (*)    All other components within normal limits  URINALYSIS, ROUTINE W REFLEX MICROSCOPIC (NOT AT [facility]) - Abnormal; Notable for the following:    APPearance TURBID (*)    Specific Gravity, Urine 1.043 (*)    Glucose, UA 250 (*)    Hgb urine dipstick  LARGE (*)    All other components within normal limits  URINE MICROSCOPIC-ADD ON - Abnormal; Notable for the following:    Squamous Epithelial / LPF 6-30 (*)    Bacteria, UA MANY (*)    All other components within normal limits  CBG MONITORING, ED - Abnormal; Notable for the following:    Glucose-Capillary 311 (*)    All other components within normal limits  POC URINE PREG, ED  GC/CHLAMYDIA PROBE AMP ([place]) NOT AT [facility]    Imaging Review No results found. I have personally reviewed and evaluated these images and lab results as part of my medical decision-making.   EKG Interpretation None      MDM   Final diagnoses:  Trichomonal vaginitis  Candida vaginitis  Hyperglycemia  Tachycardia   Patient with vaginal discharge and itching. Recent steroid and antibiotic course. Will do pelvic exam. Will check CBG. Patient is tachycardic, however denies being symptomatic  11:13 PM No evidence of PID based on exam. Wet prep showing trichomonas. Will also treat for vaginal candidiasis. Diflucan and Flagyl 2 g ordered emergency department. Results discussed with patient. Will discharge home, advised  to follow-up with her primary care doctor regarding her elevated blood sugar and watch her diet carefully. Also advised to stay hydrated and recheck her heart rate by her doctor on Monday. At this time no evidence of sepsis, patient is asymptomatic for her tachycardia, discussed return precautions  Filed Vitals:   02/27/16 2033 02/27/16 2259  BP: 133/92 141/93  Pulse: 120 114  Temp: 98.1 F (36.7 C) 98.5 F (36.9 C)  Resp: 20 [address] PA-C 02/28/16 0512  Quintella Reichert, MD 02/28/16 1553

## 2016-02-27 NOTE — Discharge Instructions (Signed)
Diflucan as prescribed daily. Make sure to watch your diet carefully, avoid sugar and caffeine. Drink plenty of fluids. Follow up with your doctor closely this week for recheck of blood sugar and fast heart rate   Hyperglycemia Hyperglycemia occurs when the glucose (sugar) in your blood is too high. Hyperglycemia can happen for many reasons, but it most often happens to people who do not know they have diabetes or are not managing their diabetes properly.  CAUSES  Whether you have diabetes or not, there are other causes of hyperglycemia. Hyperglycemia can occur when you have diabetes, but it can also occur in other situations that you might not be as aware of, such as: Diabetes  If you have diabetes and are having problems controlling your blood glucose, hyperglycemia could occur because of some of the following reasons:  Not following your meal plan.  Not taking your diabetes medications or not taking it properly.  Exercising less or doing less activity than you normally do.  Being sick. Pre-diabetes  This cannot be ignored. Before people develop Type 2 diabetes, they almost always have "pre-diabetes." This is when your blood glucose levels are higher than normal, but not yet high enough to be diagnosed as diabetes. Research has shown that some long-term damage to the body, especially the heart and circulatory system, may already be occurring during pre-diabetes. If you take action to manage your blood glucose when you have pre-diabetes, you may delay or prevent Type 2 diabetes from developing. Stress  If you have diabetes, you may be "diet" controlled or on oral medications or insulin to control your diabetes. However, you may find that your blood glucose is higher than usual in the hospital whether you have diabetes or not. This is often referred to as "stress hyperglycemia." Stress can elevate your blood glucose. This happens because of hormones put out by the body during times of stress.  If stress has been the cause of your high blood glucose, it can be followed regularly by your caregiver. That way he/she can make sure your hyperglycemia does not continue to get worse or progress to diabetes. Steroids  Steroids are medications that act on the infection fighting system (immune system) to block inflammation or infection. One side effect can be a rise in blood glucose. Most people can produce enough extra insulin to allow for this rise, but for those who cannot, steroids make blood glucose levels go even higher. It is not unusual for steroid treatments to "uncover" diabetes that is developing. It is not always possible to determine if the hyperglycemia will go away after the steroids are stopped. A special blood test called an A1c is sometimes done to determine if your blood glucose was elevated before the steroids were started. SYMPTOMS  Thirsty.  Frequent urination.  Dry mouth.  Blurred vision.  Tired or fatigue.  Weakness.  Sleepy.  Tingling in feet or leg. DIAGNOSIS  Diagnosis is made by monitoring blood glucose in one or all of the following ways:  A1c test. This is a chemical found in your blood.  Fingerstick blood glucose monitoring.  Laboratory results. TREATMENT  First, knowing the cause of the hyperglycemia is important before the hyperglycemia can be treated. Treatment may include, but is not be limited to:  Education.  Change or adjustment in medications.  Change or adjustment in meal plan.  Treatment for an illness, infection, etc.  More frequent blood glucose monitoring.  Change in exercise plan.  Decreasing or stopping steroids.  Lifestyle  changes. HOME CARE INSTRUCTIONS   Test your blood glucose as directed.  Exercise regularly. Your caregiver will give you instructions about exercise. Pre-diabetes or diabetes which comes on with stress is helped by exercising.  Eat wholesome, balanced meals. Eat often and at regular, fixed times.  Your caregiver or nutritionist will give you a meal plan to guide your sugar intake.  Being at an ideal weight is important. If needed, losing as little as 10 to 15 pounds may help improve blood glucose levels. SEEK MEDICAL CARE IF:   You have questions about medicine, activity, or diet.  You continue to have symptoms (problems such as increased thirst, urination, or weight gain). SEEK IMMEDIATE MEDICAL CARE IF:   You are vomiting or have diarrhea.  Your breath smells fruity.  You are breathing faster or slower.  You are very sleepy or incoherent.  You have numbness, tingling, or pain in your feet or hands.  You have chest pain.  Your symptoms get worse even though you have been following your caregiver's orders.  If you have any other questions or concerns.   This information is not intended to replace advice given to you by your health care provider. Make sure you discuss any questions you have with your health care provider.   Document Released: 03/29/2001 Document Revised: 12/26/2011 Document Reviewed: 06/09/2015 Elsevier Interactive Patient Education 2016 [employer].  Trichomoniasis Trichomoniasis is an infection caused by an organism called Trichomonas. The infection can affect both women and men. In women, the outer female genitalia and the vagina are affected. In men, the penis is mainly affected, but the prostate and other reproductive organs can also be involved. Trichomoniasis is a sexually transmitted infection (STI) and is most often passed to another person through sexual contact.  RISK FACTORS  Having unprotected sexual intercourse.  Having sexual intercourse with an infected partner. SIGNS AND SYMPTOMS  Symptoms of trichomoniasis in women include:  Abnormal gray-green frothy vaginal discharge.  Itching and irritation of the vagina.  Itching and irritation of the area outside the vagina. Symptoms of trichomoniasis in men include:   Penile discharge  with or without pain.  Pain during urination. This results from inflammation of the urethra. DIAGNOSIS  Trichomoniasis may be found during a Pap test or physical exam. Your health care provider may use one of the following methods to help diagnose this infection:  Testing the pH of the vagina with a test tape.  Using a vaginal swab test that checks for the Trichomonas organism. A test is available that provides results within a few minutes.  Examining a urine sample.  Testing vaginal secretions. Your health care provider may test you for other STIs, including HIV. TREATMENT   You may be given medicine to fight the infection. Women should inform their health care provider if they could be or are pregnant. Some medicines used to treat the infection should not be taken during pregnancy.  Your health care provider may recommend over-the-counter medicines or creams to decrease itching or irritation.  Your sexual partner will need to be treated if infected.  Your health care provider may test you for infection again 3 months after treatment. HOME CARE INSTRUCTIONS   Take medicines only as directed by your health care provider.  Take over-the-counter medicine for itching or irritation as directed by your health care provider.  Do not have sexual intercourse while you have the infection.  Women should not douche or wear tampons while they have the infection.  Discuss  your infection with your partner. Your partner may have gotten the infection from you, or you may have gotten it from your partner.  Have your sex partner get examined and treated if necessary.  Practice safe, informed, and protected sex.  See your health care provider for other STI testing. SEEK MEDICAL CARE IF:   You still have symptoms after you finish your medicine.  You develop abdominal pain.  You have pain when you urinate.  You have bleeding after sexual intercourse.  You develop a rash.  Your medicine  makes you sick or makes you throw up (vomit). MAKE SURE YOU:  Understand these instructions.  Will watch your condition.  Will get help right away if you are not doing well or get worse.   This information is not intended to replace advice given to you by your health care provider. Make sure you discuss any questions you have with your health care provider.   Document Released: 03/29/2001 Document Revised: 10/24/2014 Document Reviewed: 07/15/2013 Elsevier Interactive Patient Education [employer].

## 2016-02-27 NOTE — ED Notes (Signed)
PA aware that patient is allergic to Flagyl, causes hives in patient.  Patient was advised to take benadryl at home.

## 2016-02-27 NOTE — ED Notes (Signed)
Pt states that she was prescribed an antibiotic on 5/1 for an ear infection; pt states "I have the worse yeast infection of my life"; pt states that she has tried Monistat, probiotics and Azo without relief; pt states that she quit taking her antibiotic and that her ear pain has also returned

## 2016-02-29 LAB — GC/CHLAMYDIA PROBE AMP (~~LOC~~) NOT AT ARMC
Chlamydia: NEGATIVE
NEISSERIA GONORRHEA: NEGATIVE

## 2016-10-19 ENCOUNTER — Emergency Department (HOSPITAL_COMMUNITY)
Admission: EM | Admit: 2016-10-19 | Discharge: 2016-10-19 | Disposition: A | Discharge status: Home / Self Care | Payer: Self-pay | Attending: Emergency Medicine | Admitting: Emergency Medicine

## 2016-10-19 ENCOUNTER — Emergency Department (HOSPITAL_COMMUNITY): Payer: Self-pay

## 2016-10-19 ENCOUNTER — Encounter (HOSPITAL_COMMUNITY): Payer: Self-pay | Admitting: Emergency Medicine

## 2016-10-19 DIAGNOSIS — J40 Bronchitis, not specified as acute or chronic: Secondary | ICD-10-CM | POA: Insufficient documentation

## 2016-10-19 DIAGNOSIS — I10 Essential (primary) hypertension: Secondary | ICD-10-CM | POA: Insufficient documentation

## 2016-10-19 DIAGNOSIS — Z7984 Long term (current) use of oral hypoglycemic drugs: Secondary | ICD-10-CM | POA: Insufficient documentation

## 2016-10-19 DIAGNOSIS — Z87891 Personal history of nicotine dependence: Secondary | ICD-10-CM | POA: Insufficient documentation

## 2016-10-19 DIAGNOSIS — E119 Type 2 diabetes mellitus without complications: Secondary | ICD-10-CM | POA: Insufficient documentation

## 2016-10-19 MED ORDER — BENZONATATE 100 MG PO CAPS: 100.0000 mg | ORAL_CAPSULE | Freq: Three times a day (TID) | ORAL | 0 refills | Status: DC | PRN

## 2016-10-19 MED ORDER — HYDROCODONE-HOMATROPINE 5-1.5 MG/5ML PO SYRP: 5.0000 mL | ORAL_SOLUTION | ORAL | 0 refills | Status: DC | PRN

## 2016-10-19 MED ORDER — ONDANSETRON 4 MG PO TBDP
4.0000 mg | ORAL_TABLET | Freq: Once | ORAL | Status: AC
  Administered 2016-10-19: 4 mg via ORAL
  Filled 2016-10-19: qty 1

## 2016-10-19 MED ORDER — AZITHROMYCIN 250 MG PO TABS: 250.0000 mg | ORAL_TABLET | Freq: Every day | ORAL | 0 refills | Status: DC

## 2016-10-19 MED ORDER — HYDROCOD POLST-CPM POLST ER 10-8 MG/5ML PO SUER
5.0000 mL | Freq: Once | ORAL | Status: AC
  Administered 2016-10-19: 5 mL via ORAL
  Filled 2016-10-19: qty 5

## 2016-10-19 NOTE — ED Triage Notes (Addendum)
Patient reports productive cough, congestion, sore throat, and body aches, and chills x3 days. Denies abdominal pain and N/V/D.

## 2016-10-19 NOTE — ED Provider Notes (Signed)
[facility] DEPT Provider Note   CSN: [id number] Arrival date & time: 10/19/16  1143  By signing my name below, I, Sonum Patel, attest that this documentation has been prepared under the direction and in the presence of Gloriann Loan, PA-C. Electronically Signed: Sonum Patel, Education administrator. 10/19/16. 1:02 PM.  History   Chief Complaint Chief Complaint  Patient presents with  . Cough  . Nasal Congestion    The history is provided by the patient. No language interpreter was used.     HPI Comments: Sara Chan is a 46 y.o. female who presents to the Emergency Department complaining of a persistent, gradually worsened cough for the past week. She has associated nasal congestion, rhinorrhea, subjective fever, chills, generalized body aches, and a HA. She reports CP with coughing spells and a hoarse voice that began this morning. She has tried several OTC medications without significant relief. She is able to eat and drink. She denies sore throat, ear pain, SOB, or any other symptoms at this time. She denies sick contacts. She is a former smoker; quit 2.5 years ago.   Past Medical History:  Diagnosis Date  . Arthritis   . Diabetes mellitus without complication ([place])   . Hypertension     There are no active problems to display for this patient.   Past Surgical History:  Procedure Laterality Date  . ADENOIDECTOMY    . BREAST REDUCTION SURGERY    . CESAREAN SECTION    . NASAL SEPTUM SURGERY    . NOVASURE ABLATION    . TONSILLECTOMY      OB History    No data available       Home Medications    Prior to Admission medications   Medication Sig Start Date End Date Taking? Authorizing Provider  amoxicillin (AMOXIL) 500 MG capsule Take 2 capsules (1,000 mg total) by mouth 2 (two) times daily. Patient not taking: Reported on 02/27/2016 04/06/15   Charlann Lange, PA-C  amoxicillin-clavulanate (AUGMENTIN) 875-125 MG tablet Take 1 tablet by mouth 2 (two) times daily. 02/15/16   Historical  Provider, MD  azithromycin (ZITHROMAX) 250 MG tablet Take 1 tablet (250 mg total) by mouth daily. Take first 2 tablets together, then 1 every day until finished. 10/19/16   Abhiram Criado, PA-C  benzonatate (TESSALON) 100 MG capsule Take 1 capsule (100 mg total) by mouth 3 (three) times daily as needed for cough. 10/19/16   Ventura Hollenbeck, PA-C  Chlorphen-Phenyleph-ASA (ALKA-SELTZER PLUS COLD PO) Take 1 tablet by mouth every 6 (six) hours as needed (cold and cough symptoms).    Historical Provider, MD  chlorpheniramine-HYDROcodone (TUSSIONEX PENNKINETIC ER) 10-8 MG/5ML SUER Take 5 mLs by mouth every 12 (twelve) hours as needed for cough. Patient not taking: Reported on 02/27/2016 04/06/15   Charlann Lange, PA-C  dextromethorphan-guaiFENesin [facility] DM) 30-600 MG per 12 hr tablet Take 1 tablet by mouth every 4 (four) hours as needed for cough (cough).    Historical Provider, MD  guaiFENesin-dextromethorphan (ROBITUSSIN DM) 100-10 MG/5ML syrup Take 5 mLs by mouth every 4 (four) hours as needed for cough (cough).    Historical Provider, MD  HYDROcodone-acetaminophen (NORCO/VICODIN) 5-325 MG tablet Take 1-2 tablets by mouth every 6 hours as needed for pain and/or cough. Patient not taking: Reported on 02/27/2016 09/03/15   Elmyra Ricks Pisciotta, PA-C  HYDROcodone-homatropine [facility]) 5-1.5 MG/5ML syrup Take 5 mLs by mouth every 4 (four) hours as needed for cough. 10/19/16   Gloriann Loan, PA-C  losartan-hydrochlorothiazide (HYZAAR) 100-12.5 MG per tablet Take 1  tablet by mouth daily. 03/04/15   Historical Provider, MD  meloxicam (MOBIC) 7.5 MG tablet TAKE 1 TABLET BY MOUTH TWICE A DAY WITH FOOD AS NEEDED FOR PAIN 03/04/15   Historical Provider, MD  metFORMIN (GLUCOPHAGE) 500 MG tablet Take 500 mg by mouth daily.     Historical Provider, MD  naproxen sodium (ANAPROX) 220 MG tablet Take 440 mg by mouth daily as needed (pain).    Historical Provider, MD  predniSONE (STERAPRED UNI-PAK 21 TAB) 10 MG (21) TBPK tablet TABLETS BY MOUTH AS  DIRECTED TAPER PACK 02/15/16   Historical Provider, MD    Family History History reviewed. No pertinent family history.  Social History Social History  Substance Use Topics  . Smoking status: Former Smoker    Packs/day: 0.00    Quit date: 07/17/2014  . Smokeless tobacco: Never Used  . Alcohol use No     Comment: occ     Allergies   Codeine and Flagyl [metronidazole]   Review of Systems Review of Systems  A complete 10 system review of systems was obtained and all systems are negative except as noted in the HPI and PMH.   Physical Exam Updated Vital Signs BP 133/76   Pulse 96   Temp 97.9 F (36.6 C)   Resp 18   SpO2 100%   Physical Exam  Constitutional: She is oriented to person, place, and time. She appears well-developed and well-nourished. She is active.  Non-toxic appearance. She does not have a sickly appearance. She does not appear ill.  Hacking cough throughout H&P  HENT:  Head: Normocephalic and atraumatic.  Right Ear: Tympanic membrane and external ear normal. Tympanic membrane is not erythematous and not bulging.  Left Ear: Tympanic membrane and external ear normal. Tympanic membrane is not erythematous and not bulging.  Nose: Nose normal.  Mouth/Throat: Uvula is midline, oropharynx is clear and moist and mucous membranes are normal. No trismus in the jaw. No uvula swelling. No oropharyngeal exudate, posterior oropharyngeal edema, posterior oropharyngeal erythema or tonsillar abscesses.  Neck: Normal range of motion. Neck supple.  No nuchal rigidity.   Cardiovascular: Normal rate and regular rhythm.   Pulmonary/Chest: Effort normal and breath sounds normal. No respiratory distress. She has no wheezes. She has no rales.  Abdominal: Soft. Bowel sounds are normal. She exhibits no distension. There is no tenderness.  Musculoskeletal: Normal range of motion.  Lymphadenopathy:    She has no cervical adenopathy.  Neurological: She is alert and oriented to person,  place, and time.  Skin: Skin is warm and dry.  Psychiatric: She has a normal mood and affect. Her behavior is normal.     ED Treatments / Results  DIAGNOSTIC STUDIES: Oxygen Saturation is 93% on RA, adequate by my interpretation.    COORDINATION OF CARE: 1:01 PM Discussed treatment plan with pt at bedside and pt agreed to plan.   Labs (all labs ordered are listed, but only abnormal results are displayed) Labs Reviewed - No data to display  EKG  EKG Interpretation None       Radiology Dg Chest 2 View  Result Date: 10/19/2016 CLINICAL DATA:  Cough and myalgias for the past 2 weeks. Former smoker. History of diabetes. EXAM: CHEST  2 VIEW COMPARISON:  PA and lateral chest x-ray of April 06, 2015 FINDINGS: The lungs are reasonably well inflated. There is no focal infiltrate. The interstitial markings are coarse though stable the perihilar lung markings remain prominent. The heart is top-normal in size. The pulmonary  vascularity is normal. The mediastinum is normal in width. The bony thorax is unremarkable. IMPRESSION: Fairly stable chronic bronchitic changes. No evidence of pneumonia nor pulmonary edema nor other acute cardiopulmonary abnormality. Electronically Signed   By: David  Martinique M.D.   On: 10/19/2016 13:09    Procedures Procedures (including critical care time)  Medications Ordered in ED Medications  chlorpheniramine-HYDROcodone (TUSSIONEX) 10-8 MG/5ML suspension 5 mL (5 mLs Oral Given 10/19/16 1306)  ondansetron (ZOFRAN-ODT) disintegrating tablet 4 mg (4 mg Oral Given 10/19/16 1255)     Initial Impression / Assessment and Plan / ED Course  I have reviewed the triage vital signs and the nursing notes.  Pertinent labs & imaging results that were available during my care of the patient were reviewed by me and considered in my medical decision making (see chart for details).  Clinical Course     Pt symptoms consistent with bronchitis. CXR negative for acute infiltrate. Pt  will be discharged with symptomatic treatment.  Discussed return precautions.  Pt is hemodynamically stable & in NAD prior to discharge.   Final Clinical Impressions(s) / ED Diagnoses   Final diagnoses:  Bronchitis    New Prescriptions New Prescriptions   AZITHROMYCIN (ZITHROMAX) 250 MG TABLET    Take 1 tablet (250 mg total) by mouth daily. Take first 2 tablets together, then 1 every day until finished.   BENZONATATE (TESSALON) 100 MG CAPSULE    Take 1 capsule (100 mg total) by mouth 3 (three) times daily as needed for cough.   HYDROCODONE-HOMATROPINE (HYCODAN) 5-1.5 MG/5ML SYRUP    Take 5 mLs by mouth every 4 (four) hours as needed for cough.   I personally performed the services described in this documentation, which was scribed in my presence. The recorded information has been reviewed and is accurate.    Gloriann Loan, PA-C 10/19/16 1335    Charlesetta Shanks, MD 10/20/16 [phone]

## 2016-10-19 NOTE — Discharge Instructions (Signed)
Your chest x-ray today was normal. Her symptoms are likely due to bronchitis. Please take Tessalon Perles 3 times daily for cough. Take Hycodan at night for your cough. Take Zithromax for possible bacterial cause. Continue taking Tylenol and ibuprofen for body aches and headache. Follow-up with her primary care doctor the next 2-3 days. Return to the emergency department for any new or concerning symptoms.

## 2016-12-22 ENCOUNTER — Other Ambulatory Visit (HOSPITAL_COMMUNITY)
Admission: RE | Admit: 2016-12-22 | Discharge: 2016-12-22 | Disposition: A | Discharge status: Home / Self Care | Payer: BLUE CROSS/BLUE SHIELD | Source: Ambulatory Visit | Attending: Family Medicine | Admitting: Family Medicine

## 2016-12-22 ENCOUNTER — Other Ambulatory Visit: Payer: Self-pay | Admitting: Family Medicine

## 2016-12-22 DIAGNOSIS — Z01419 Encounter for gynecological examination (general) (routine) without abnormal findings: Secondary | ICD-10-CM | POA: Diagnosis present

## 2016-12-22 DIAGNOSIS — Z1151 Encounter for screening for human papillomavirus (HPV): Secondary | ICD-10-CM | POA: Diagnosis present

## 2016-12-23 LAB — CYTOLOGY - PAP
Diagnosis: NEGATIVE
HPV: NOT DETECTED

## 2017-02-17 ENCOUNTER — Emergency Department (HOSPITAL_COMMUNITY)
Admission: EM | Admit: 2017-02-17 | Discharge: 2017-02-17 | Disposition: A | Discharge status: Home / Self Care | Payer: BLUE CROSS/BLUE SHIELD | Attending: Emergency Medicine | Admitting: Emergency Medicine

## 2017-02-17 ENCOUNTER — Encounter (HOSPITAL_COMMUNITY): Payer: Self-pay | Admitting: Emergency Medicine

## 2017-02-17 ENCOUNTER — Emergency Department (HOSPITAL_COMMUNITY): Payer: BLUE CROSS/BLUE SHIELD

## 2017-02-17 DIAGNOSIS — D72829 Elevated white blood cell count, unspecified: Secondary | ICD-10-CM | POA: Insufficient documentation

## 2017-02-17 DIAGNOSIS — Z87891 Personal history of nicotine dependence: Secondary | ICD-10-CM | POA: Insufficient documentation

## 2017-02-17 DIAGNOSIS — E1165 Type 2 diabetes mellitus with hyperglycemia: Secondary | ICD-10-CM

## 2017-02-17 DIAGNOSIS — B373 Candidiasis of vulva and vagina: Secondary | ICD-10-CM | POA: Insufficient documentation

## 2017-02-17 DIAGNOSIS — N83202 Unspecified ovarian cyst, left side: Secondary | ICD-10-CM

## 2017-02-17 DIAGNOSIS — N390 Urinary tract infection, site not specified: Secondary | ICD-10-CM

## 2017-02-17 DIAGNOSIS — D72828 Other elevated white blood cell count: Secondary | ICD-10-CM

## 2017-02-17 DIAGNOSIS — R000 Tachycardia, unspecified: Secondary | ICD-10-CM

## 2017-02-17 DIAGNOSIS — D259 Leiomyoma of uterus, unspecified: Secondary | ICD-10-CM

## 2017-02-17 DIAGNOSIS — Z7984 Long term (current) use of oral hypoglycemic drugs: Secondary | ICD-10-CM | POA: Diagnosis not present

## 2017-02-17 DIAGNOSIS — R509 Fever, unspecified: Secondary | ICD-10-CM

## 2017-02-17 DIAGNOSIS — I10 Essential (primary) hypertension: Secondary | ICD-10-CM | POA: Diagnosis not present

## 2017-02-17 DIAGNOSIS — R197 Diarrhea, unspecified: Secondary | ICD-10-CM

## 2017-02-17 DIAGNOSIS — B3731 Acute candidiasis of vulva and vagina: Secondary | ICD-10-CM

## 2017-02-17 DIAGNOSIS — D729 Disorder of white blood cells, unspecified: Secondary | ICD-10-CM

## 2017-02-17 DIAGNOSIS — R1084 Generalized abdominal pain: Secondary | ICD-10-CM

## 2017-02-17 DIAGNOSIS — D649 Anemia, unspecified: Secondary | ICD-10-CM | POA: Diagnosis not present

## 2017-02-17 LAB — DIFFERENTIAL
Basophils Absolute: 0 10*3/uL (ref 0.0–0.1)
Basophils Relative: 0 %
EOS PCT: 1 %
Eosinophils Absolute: 0.1 10*3/uL (ref 0.0–0.7)
LYMPHS ABS: 1.3 10*3/uL (ref 0.7–4.0)
LYMPHS PCT: 10 %
Monocytes Absolute: 0.9 10*3/uL (ref 0.1–1.0)
Monocytes Relative: 7 %
Neutro Abs: 10.4 10*3/uL — ABNORMAL HIGH (ref 1.7–7.7)
Neutrophils Relative %: 82 %

## 2017-02-17 LAB — URINALYSIS, ROUTINE W REFLEX MICROSCOPIC
BILIRUBIN URINE: NEGATIVE
Glucose, UA: NEGATIVE mg/dL
HGB URINE DIPSTICK: NEGATIVE
Ketones, ur: NEGATIVE mg/dL
Nitrite: POSITIVE — AB
Protein, ur: 100 mg/dL — AB
Specific Gravity, Urine: 1.016 (ref 1.005–1.030)
pH: 7 (ref 5.0–8.0)

## 2017-02-17 LAB — POC OCCULT BLOOD, ED: FECAL OCCULT BLD: NEGATIVE

## 2017-02-17 LAB — COMPREHENSIVE METABOLIC PANEL
ALBUMIN: 3.6 g/dL (ref 3.5–5.0)
ALT: 15 U/L (ref 14–54)
AST: 13 U/L — AB (ref 15–41)
Alkaline Phosphatase: 57 U/L (ref 38–126)
Anion gap: 9 (ref 5–15)
BUN: 10 mg/dL (ref 6–20)
CHLORIDE: 99 mmol/L — AB (ref 101–111)
CO2: 27 mmol/L (ref 22–32)
CREATININE: 0.67 mg/dL (ref 0.44–1.00)
Calcium: 8.8 mg/dL — ABNORMAL LOW (ref 8.9–10.3)
GFR calc non Af Amer: >60 mL/min (ref >60)
Glucose, Bld: 172 mg/dL — ABNORMAL HIGH (ref 65–99)
Potassium: 3.4 mmol/L — ABNORMAL LOW (ref 3.5–5.1)
SODIUM: 135 mmol/L (ref 135–145)
Total Bilirubin: 0.9 mg/dL (ref 0.3–1.2)
Total Protein: 7.8 g/dL (ref 6.5–8.1)

## 2017-02-17 LAB — I-STAT BETA HCG BLOOD, ED (MC, WL, AP ONLY)

## 2017-02-17 LAB — CBC
HCT: 35.6 % — ABNORMAL LOW (ref 36.0–46.0)
Hemoglobin: 11.3 g/dL — ABNORMAL LOW (ref 12.0–15.0)
MCH: 27.7 pg (ref 26.0–34.0)
MCHC: 31.7 g/dL (ref 30.0–36.0)
MCV: 87.3 fL (ref 78.0–100.0)
PLATELETS: 310 10*3/uL (ref 150–400)
RBC: 4.08 MIL/uL (ref 3.87–5.11)
RDW: 13.8 % (ref 11.5–15.5)
WBC: 12.6 10*3/uL — ABNORMAL HIGH (ref 4.0–10.5)

## 2017-02-17 LAB — LIPASE, BLOOD: LIPASE: 21 U/L (ref 11–51)

## 2017-02-17 LAB — I-STAT CG4 LACTIC ACID, ED: Lactic Acid, Venous: 1.46 mmol/L (ref 0.5–1.9)

## 2017-02-17 MED ORDER — SODIUM CHLORIDE 0.9 % IV BOLUS (SEPSIS)
1000.0000 mL | Freq: Once | INTRAVENOUS | Status: AC
  Administered 2017-02-17: 1000 mL via INTRAVENOUS

## 2017-02-17 MED ORDER — IOPAMIDOL (ISOVUE-300) INJECTION 61%
100.0000 mL | Freq: Once | INTRAVENOUS | Status: AC | PRN
  Administered 2017-02-17: 100 mL via INTRAVENOUS

## 2017-02-17 MED ORDER — FLUCONAZOLE 150 MG PO TABS
150.0000 mg | ORAL_TABLET | Freq: Once | ORAL | Status: AC
Start: 2017-02-17 — End: 2017-02-17
  Administered 2017-02-17: 150 mg via ORAL
  Filled 2017-02-17: qty 1

## 2017-02-17 MED ORDER — DICYCLOMINE HCL 20 MG PO TABS: 20.0000 mg | ORAL_TABLET | Freq: Two times a day (BID) | ORAL | 0 refills | Status: DC | PRN

## 2017-02-17 MED ORDER — IOPAMIDOL (ISOVUE-300) INJECTION 61%
INTRAVENOUS | Status: AC
  Filled 2017-02-17: qty 100

## 2017-02-17 MED ORDER — FLUCONAZOLE 150 MG PO TABS: 150.0000 mg | ORAL_TABLET | Freq: Once | ORAL | 0 refills | Status: AC

## 2017-02-17 MED ORDER — ACETAMINOPHEN 500 MG PO TABS
1000.0000 mg | ORAL_TABLET | Freq: Once | ORAL | Status: AC
  Administered 2017-02-17: 1000 mg via ORAL
  Filled 2017-02-17: qty 2

## 2017-02-17 MED ORDER — CEPHALEXIN 500 MG PO CAPS: ORAL_CAPSULE | ORAL | 0 refills | Status: DC

## 2017-02-17 MED ORDER — IOPAMIDOL (ISOVUE-300) INJECTION 61%
30.0000 mL | Freq: Once | INTRAVENOUS | Status: AC | PRN
  Administered 2017-02-17: 30 mL via ORAL

## 2017-02-17 MED ORDER — IOPAMIDOL (ISOVUE-300) INJECTION 61%
INTRAVENOUS | Status: AC
  Filled 2017-02-17: qty 30

## 2017-02-17 MED ORDER — DICYCLOMINE HCL 10 MG/ML IM SOLN
20.0000 mg | Freq: Once | INTRAMUSCULAR | Status: AC
  Administered 2017-02-17: 20 mg via INTRAMUSCULAR
  Filled 2017-02-17: qty 2

## 2017-02-17 MED ORDER — IBUPROFEN 800 MG PO TABS
800.0000 mg | ORAL_TABLET | Freq: Once | ORAL | Status: AC
  Administered 2017-02-17: 800 mg via ORAL
  Filled 2017-02-17: qty 1

## 2017-02-17 MED ORDER — MORPHINE SULFATE (PF) 4 MG/ML IV SOLN
4.0000 mg | Freq: Once | INTRAVENOUS | Status: AC
  Administered 2017-02-17: 4 mg via INTRAVENOUS
  Filled 2017-02-17: qty 1

## 2017-02-17 NOTE — ED Provider Notes (Signed)
[facility] DEPT Provider Note   CSN: [id number] Arrival date & time: 02/17/17  1425     History   Chief Complaint Chief Complaint  Patient presents with  . Abdominal Pain    HPI Sara Chan is a 46 y.o. female with a PMHx of DM2, HTN, uterine fibroids, L ovarian cyst, and arthritis, with a PSHx of C/S, tubal ligation, and endometrial ablation, who presents to the ED with complaints of generalized abdominal pain that started on Wednesday night approximately 2 days ago and has gradually worsened. Initially when the pain started she realized that she hadn't had a bowel movement in 3 days so she thought perhaps it was because of that, she tried taking some Gas-X, ultimately had a normal soft bowel movement that night but this increased pain; since then she's began to have diarrhea and the pain has worsened. She describes the pain is 9/10 constant sharp nonradiating generalized abdominal pain worse with movement, urination, and bowel movements, and unrelieved with ibuprofen, BC powders, and Gas-X. She reports approximately 4 episodes of nonbloody watery diarrhea per day since onset, and has noticed that her urine is darker. She has also noticed a suprapubic pressure sensation when she tries to urinate however she denies any dysuria or burning sensation. She no longer has menstrual cycle due to having an ablation. Her PCP is Dr. Lynelle Doctor at [facility]. She just ate a few bites of a sandwich and some chips about 30 mins prior to exam (~5:30pm). No other abd surgeries aside from those mentioned above.   She denies fevers, chills, CP, SOB, nausea/vomiting, constipation, obstipation, melena, hematochezia, rectal pain, hematuria, dysuria, malodorous urine, increased urinary frequency, vaginal bleeding/discharge, myalgias, arthralgias, numbness, tingling, focal weakness, or any other complaints at this time. Denies recent travel, sick contacts, suspicious food intake, EtOH use, or frequent NSAID use.     The history is provided by the patient and medical records. No language interpreter was used.  Abdominal Pain   This is a new problem. The current episode started 2 days ago. The problem occurs constantly. The problem has been gradually worsening. The pain is associated with an unknown factor. The pain is located in the generalized abdominal region. The quality of the pain is sharp. The pain is at a severity of 9/10. The pain is severe. Associated symptoms include diarrhea. Pertinent negatives include fever, flatus, hematochezia, melena, nausea, vomiting, constipation, dysuria, frequency, hematuria, arthralgias and myalgias. The symptoms are aggravated by activity, bowel movements and urination. Nothing relieves the symptoms.    Past Medical History:  Diagnosis Date  . Arthritis   . Diabetes mellitus without complication ([place])   . Hypertension     There are no active problems to display for this patient.   Past Surgical History:  Procedure Laterality Date  . ADENOIDECTOMY    . BREAST REDUCTION SURGERY    . CESAREAN SECTION    . NASAL SEPTUM SURGERY    . NOVASURE ABLATION    . TONSILLECTOMY      OB History    No data available       Home Medications    Prior to Admission medications   Medication Sig Start Date End Date Taking? Authorizing Provider  amoxicillin (AMOXIL) 500 MG capsule Take 2 capsules (1,000 mg total) by mouth 2 (two) times daily. Patient not taking: Reported on 02/27/2016 04/06/15   Charlann Lange, PA-C  amoxicillin-clavulanate (AUGMENTIN) 875-125 MG tablet Take 1 tablet by mouth 2 (two) times daily. 02/15/16   Historical  Provider, MD  azithromycin (ZITHROMAX) 250 MG tablet Take 1 tablet (250 mg total) by mouth daily. Take first 2 tablets together, then 1 every day until finished. 10/19/16   Kayla Rose, PA-C  benzonatate (TESSALON) 100 MG capsule Take 1 capsule (100 mg total) by mouth 3 (three) times daily as needed for cough. 10/19/16   Kayla Rose, PA-C   Chlorphen-Phenyleph-ASA (ALKA-SELTZER PLUS COLD PO) Take 1 tablet by mouth every 6 (six) hours as needed (cold and cough symptoms).    Historical Provider, MD  chlorpheniramine-HYDROcodone (TUSSIONEX PENNKINETIC ER) 10-8 MG/5ML SUER Take 5 mLs by mouth every 12 (twelve) hours as needed for cough. Patient not taking: Reported on 02/27/2016 04/06/15   Charlann Lange, PA-C  dextromethorphan-guaiFENesin [facility] DM) 30-600 MG per 12 hr tablet Take 1 tablet by mouth every 4 (four) hours as needed for cough (cough).    Historical Provider, MD  guaiFENesin-dextromethorphan (ROBITUSSIN DM) 100-10 MG/5ML syrup Take 5 mLs by mouth every 4 (four) hours as needed for cough (cough).    Historical Provider, MD  HYDROcodone-acetaminophen (NORCO/VICODIN) 5-325 MG tablet Take 1-2 tablets by mouth every 6 hours as needed for pain and/or cough. Patient not taking: Reported on 02/27/2016 09/03/15   Elmyra Ricks Pisciotta, PA-C  HYDROcodone-homatropine [facility]) 5-1.5 MG/5ML syrup Take 5 mLs by mouth every 4 (four) hours as needed for cough. 10/19/16   Gloriann Loan, PA-C  losartan-hydrochlorothiazide (HYZAAR) 100-12.5 MG per tablet Take 1 tablet by mouth daily. 03/04/15   Historical Provider, MD  meloxicam (MOBIC) 7.5 MG tablet TAKE 1 TABLET BY MOUTH TWICE A DAY WITH FOOD AS NEEDED FOR PAIN 03/04/15   Historical Provider, MD  metFORMIN (GLUCOPHAGE) 500 MG tablet Take 500 mg by mouth daily.     Historical Provider, MD  naproxen sodium (ANAPROX) 220 MG tablet Take 440 mg by mouth daily as needed (pain).    Historical Provider, MD  predniSONE (STERAPRED UNI-PAK 21 TAB) 10 MG (21) TBPK tablet TABLETS BY MOUTH AS DIRECTED TAPER PACK 02/15/16   Historical Provider, MD    Family History History reviewed. No pertinent family history.  Social History Social History  Substance Use Topics  . Smoking status: Former Smoker    Packs/day: 0.00    Quit date: 07/17/2014  . Smokeless tobacco: Never Used  . Alcohol use No     Comment: occ      Allergies   Codeine and Flagyl [metronidazole]   Review of Systems Review of Systems  Constitutional: Negative for chills and fever.  Respiratory: Negative for shortness of breath.   Cardiovascular: Negative for chest pain.  Gastrointestinal: Positive for abdominal pain and diarrhea. Negative for blood in stool, constipation, flatus, hematochezia, melena, nausea, rectal pain and vomiting.  Genitourinary: Negative for dysuria, frequency, hematuria, vaginal bleeding and vaginal discharge.       +dark urine No malodorous urine  Musculoskeletal: Negative for arthralgias and myalgias.  Skin: Negative for color change.  Allergic/Immunologic: Positive for immunocompromised state (DM2).  Neurological: Negative for weakness and numbness.  Psychiatric/Behavioral: Negative for confusion.   All other systems reviewed and are negative for acute change except as noted in the HPI.    Physical Exam Updated Vital Signs BP (!) 133/93 (BP Location: Left Wrist)   Pulse (!) 116   Temp 99.9 F (37.7 C) (Oral)   Resp 19   Ht 5\' 4"  (1.626 m)   Wt 124.7 kg   SpO2 98%   BMI 47.20 kg/m  Re-check VS 7:23 PM: Temp (!) 102.5 F (39.2  C) (Rectal)    Physical Exam  Constitutional: She is oriented to person, place, and time. She appears well-developed and well-nourished.  Non-toxic appearance. No distress.  Low-grade temp 99.9 in triage but feels warm to touch, nontoxic, NAD  Update @7 :23PM: rectal temp 102.5  HENT:  Head: Normocephalic and atraumatic.  Mouth/Throat: Oropharynx is clear and moist and mucous membranes are normal.  Eyes: Conjunctivae and EOM are normal. Right eye exhibits no discharge. Left eye exhibits no discharge.  Neck: Normal range of motion. Neck supple.  Cardiovascular: Regular rhythm, normal heart sounds and intact distal pulses.  Tachycardia present.  Exam reveals no gallop and no friction rub.   No murmur heard. Mildly tachycardic  Pulmonary/Chest: Effort normal and  breath sounds normal. No respiratory distress. She has no decreased breath sounds. She has no wheezes. She has no rhonchi. She has no rales.  Abdominal: Soft. Normal appearance and bowel sounds are normal. She exhibits no distension. There is generalized tenderness. There is guarding (voluntary). There is no rigidity, no rebound, no CVA tenderness, no tenderness at McBurney's point (RLQ tenderness not focally over mcburney's point) and negative Murphy's sign (not great evaluation since pt c/o pain just with regular palpation, but able to fully inspire during murphy's exam).  Soft, obese which limits exam slightly, but no obvious distension noted, +BS throughout, with diffuse generalized abd TTP, pt denies specific area being worse than others, however objectively it seems more tender in RUQ and RLQ; some mild voluntary guarding, no rebound/rigidity. Pt complains of pain during murphy's exam, however she's able to fully inspire during this test. RLQ tenderness is generalized in that quadrant, no focal mcburney's point TTP. No CVA TTP   Musculoskeletal: Normal range of motion.  Neurological: She is alert and oriented to person, place, and time. She has normal strength. No sensory deficit.  Skin: Skin is warm, dry and intact. No rash noted.  Psychiatric: She has a normal mood and affect.  Nursing note and vitals reviewed.    ED Treatments / Results  Labs (all labs ordered are listed, but only abnormal results are displayed) Labs Reviewed  COMPREHENSIVE METABOLIC PANEL - Abnormal; Notable for the following:       Result Value   Potassium 3.4 (*)    Chloride 99 (*)    Glucose, Bld 172 (*)    Calcium 8.8 (*)    AST 13 (*)    All other components within normal limits  CBC - Abnormal; Notable for the following:    WBC 12.6 (*)    Hemoglobin 11.3 (*)    HCT 35.6 (*)    All other components within normal limits  URINALYSIS, ROUTINE W REFLEX MICROSCOPIC - Abnormal; Notable for the following:     APPearance TURBID (*)    Protein, ur 100 (*)    Nitrite POSITIVE (*)    Leukocytes, UA LARGE (*)    Bacteria, UA FEW (*)    Squamous Epithelial / LPF 0-5 (*)    All other components within normal limits  DIFFERENTIAL - Abnormal; Notable for the following:    Neutro Abs 10.4 (*)    All other components within normal limits  CULTURE, BLOOD (ROUTINE X 2)  CULTURE, BLOOD (ROUTINE X 2)  URINE CULTURE  LIPASE, BLOOD  I-STAT BETA HCG BLOOD, ED ([facility], [facility], [facility] ONLY)  I-STAT CG4 LACTIC ACID, ED  POC OCCULT BLOOD, ED    EKG  EKG Interpretation  Date/Time:  Friday Feb 17 2017 20:03:57 EDT Ventricular  Rate:  119 PR Interval:    QRS Duration: 80 QT Interval:  293 QTC Calculation: 413 R Axis:   37 Text Interpretation:  Sinus tachycardia Low voltage, precordial leads Consider anterior infarct Confirmed by ISAACS MD, Lysbeth Galas [phone]) on 02/17/2017 10:09:45 PM       Radiology Ct Abdomen Pelvis W Contrast  Result Date: 02/17/2017 CLINICAL DATA:  Generalized abdominal pain for 3 days EXAM: CT ABDOMEN AND PELVIS WITH CONTRAST TECHNIQUE: Multidetector CT imaging of the abdomen and pelvis was performed using the standard protocol following bolus administration of intravenous contrast. CONTRAST:  40mL ISOVUE-300 IOPAMIDOL (ISOVUE-300) INJECTION 61%, 119mL ISOVUE-300 IOPAMIDOL (ISOVUE-300) INJECTION 61% COMPARISON:  12/14/2014 FINDINGS: Lower chest: Lung bases demonstrate hazy and streaky posterior lower lobe atelectasis. Mild cardiomegaly. Hepatobiliary: Hepatic steatosis. No calcified gallstones or biliary dilatation Pancreas: Unremarkable. No pancreatic ductal dilatation or surrounding inflammatory changes. Spleen: Normal in size without focal abnormality. Adrenals/Urinary Tract: Adrenal glands are within normal limits. No hydronephrosis. Subcentimeter hypodense left midpole lesion too small to further characterize. 16 mm cyst lower pole right kidney. Bladder unremarkable. Stomach/Bowel: Stomach is within  normal limits. Appendix appears normal. No evidence of bowel wall thickening, or inflammatory changes. Prominent loop of small bowel in the upper abdomen, measuring 3.5 cm but without convincing evidence for a bowel obstruction. Vascular/Lymphatic: Aortic atherosclerosis. No enlarged abdominal or pelvic lymph nodes. Reproductive: Enlarged uterus with multiple masses. A posterior heterogenous mass measures 7.7 x 7.1 cm, slightly decreased compared to prior measurements of 9.3 x 7.8 cm. 2.7 cm cyst left ovary. Other: No free air or free fluid. Musculoskeletal: No acute or significant osseous findings. IMPRESSION: 1. No definite CT evidence for acute intra-abdominal or pelvic pathology. 2. Hepatic steatosis. 3. Slight decreased size of a heterogenous mass in the posterior uterus. Electronically Signed   By: Donavan Foil M.D.   On: 02/17/2017 21:12    Procedures Procedures (including critical care time)  Medications Ordered in ED Medications  iopamidol (ISOVUE-300) 61 % injection (not administered)  iopamidol (ISOVUE-300) 61 % injection (not administered)  fluconazole (DIFLUCAN) tablet 150 mg (not administered)  sodium chloride 0.9 % bolus 1,000 mL (0 mLs Intravenous Stopped 02/17/17 2152)  morphine 4 MG/ML injection 4 mg (4 mg Intravenous Given 02/17/17 1925)  dicyclomine (BENTYL) injection 20 mg (20 mg Intramuscular Given 02/17/17 1925)  acetaminophen (TYLENOL) tablet 1,000 mg (1,000 mg Oral Given 02/17/17 1946)  iopamidol (ISOVUE-300) 61 % injection 100 mL (100 mLs Intravenous Contrast Given 02/17/17 2049)  iopamidol (ISOVUE-300) 61 % injection 30 mL (30 mLs Oral Contrast Given 02/17/17 1900)  sodium chloride 0.9 % bolus 1,000 mL (1,000 mLs Intravenous New Bag/Given 02/17/17 2159)  ibuprofen (ADVIL,MOTRIN) tablet 800 mg (800 mg Oral Given 02/17/17 2220)     Initial Impression / Assessment and Plan / ED Course  I have reviewed the triage vital signs and the nursing notes.  Pertinent labs & imaging results  that were available during my care of the patient were reviewed by me and considered in my medical decision making (see chart for details).     46 y.o. female here with generalized abd pain x2 days, initially thought it was d/t needing to have a BM, but had a normal soft BM which caused pain to worsen; since then has had diarrhea. Noticed her urine is darker as well, but not malodorous. Some suprapubic pressure with urination but no dysuria. On exam, obese but nondistended however body habitus limits exam slightly; diffuse generalized abd TTP without significant focality but  objectively it seems to be more in the RUQ and RLQ, murphy's exam limited due to pt stating it hurts just with regular palpation but she's able to inspire fully; tenderness in RLQ isn't focally over mcburney's point. Some mild voluntary guarding, no rebound or rigidity. Temp in triage 99.9, will get rectal temp to see what her core body temp is. Mildly tachycardic. Labs so far show: betaHCG neg, lipase WNL, CMP with K 3.4 doubt need for repletion here, gluc 172 but otherwise remainder of CMP WNL; CBC with mild leukocytosis and chronic anemia, will obtain differential. Will also add-on lactic. Awaiting U/A, pt unable to provide sample initially. Given broad differential, including diverticulitis/colitis vs gastroenteritis vs appendicitis vs gallbladder pathology vs obstruction/perf/etc, will proceed with CT abd since this will provide much more information. If this is neg or nonconclusive for looking at gallbladder, may consider RUQ U/S if the pain seems to be more persistently in RUQ; but will hold off for now. Will give pain meds, bentyl, and fluids and reassess shortly.   7:36 PM Rectal temp 102.5, as anticipated; will give tylenol. Differential showing neutrophilic predominance. Lactic WNL so will not call code sepsis, but will add-on EKG, BCx, and UCx. U/A not yet collected, and CT not yet done. Will continue to monitor and reassess.  Not hypotensive, will not give more fluids than what was given initially; will hold off on any empiric abx either, since it's still possible this is viral, until we get CT confirming some other etiology that would require abx. Will reassess shortly.   10:10 PM FOBT neg. CT without acute intraabdominal or pelvic pathology; decreasing size of uterine fibroids, and L ovarian cyst when compared to CT and TVUS from 12/14/14. EKG nonischemic, only showing tachycardia. Tachycardia persisting after 1st bolus, will recheck VS to see if she's still febrile as an explanation to her persistent tachycardia, and give second bolus of fluids now. If still febrile, will give ibuprofen. Pt still hasn't given U/A sample, will see if she can give [place] one now. Pt continues to feel improved. Will reassess shortly.   11:16 PM U/A with +nitrites, +leuks, TNTC WBC, few bacteria, 0-5 squamous, +WBC clumps, +budding yeast. UCx in process. Will give diflucan once here, and once to take after she finishes the abx for her UTI. Temp on earlier recheck 100.3 axillary; given 800mg  ibuprofen along with second bolus as previously mentioned. HR now 104-108 but actually, further chart review reveals she is always tachycardic on all prior ED visits (range 100s-120s on every ED visit), so this seems potentially chronic. Overall pt doing well. Advised BRAT diet, tylenol/motrin for pain/fever, will rx bentyl in addition to abx and diflucan. Advised staying hydrated, f/up with PCP in 1wk for recheck. I explained the diagnosis and have given explicit precautions to return to the ER including for any other new or worsening symptoms. The patient understands and accepts the medical plan as it's been dictated and I have answered their questions. Discharge instructions concerning home care and prescriptions have been given. The patient is STABLE and is discharged to home in good condition.     Final Clinical Impressions(s) / ED Diagnoses   Final  diagnoses:  Generalized abdominal pain  Diarrhea, unspecified type  Neutrophilic leukocytosis  Type 2 diabetes mellitus with hyperglycemia, without long-term current use of insulin (HCC)  Fever in adult  Uterine leiomyoma, unspecified location  Left ovarian cyst  Chronic anemia  Tachycardia  Lower urinary tract infection  Yeast vaginitis  New Prescriptions New Prescriptions   CEPHALEXIN (KEFLEX) 500 MG CAPSULE    2 caps po bid x 7 days   DICYCLOMINE (BENTYL) 20 MG TABLET    Take 1 tablet (20 mg total) by mouth 2 (two) times daily as needed for spasms (abdominal spasms/diarrhea).   FLUCONAZOLE (DIFLUCAN) 150 MG TABLET    Take 1 tablet (150 mg total) by mouth once. TAKE AFTER YOU [place], PA-C 02/17/17 2321    Duffy Bruce, MD 02/18/17 1128

## 2017-02-17 NOTE — ED Triage Notes (Signed)
Pt reports generalized abd pain for the past 3 days. Pain worse with movement. No n/v/d. Tried gasX with no relief.

## 2017-02-17 NOTE — ED Notes (Signed)
Pt unable to urinate at this time.  

## 2017-02-17 NOTE — Discharge Instructions (Signed)
Alternate between tylenol and motrin as needed for pain. Stay well hydrated with small sips of fluids throughout the day. Take bentyl as directed as needed for diarrhea/abdominal cramping. Follow a BRAT (banana-rice-applesauce-toast) diet as described below for the next 24-48 hours. The 'BRAT' diet is suggested, then progress to diet as tolerated as symptoms abate. Take antibiotic until completed. Take diflucan dose after you finish the course of the antibiotics. Follow up with your primary care physician in 1 week for recheck of ongoing symptoms but return to ER for emergent changing or worsening of symptoms.  Please seek immediate care if you develop the following: You develop back pain.  Your symptoms are no better, or worse in 3 days. There is severe back pain or lower abdominal pain.  You develop chills.  You have a fever.  There is nausea or vomiting.  There is continued burning or discomfort with urination.

## 2017-02-17 NOTE — ED Notes (Signed)
Pt attempted to get urine sample however she didn't get any in the cup, pt stated she had been waiting too long in the lobby and couldn't hold it.

## 2017-02-19 LAB — URINE CULTURE

## 2017-02-22 LAB — CULTURE, BLOOD (ROUTINE X 2)
Culture: NO GROWTH
Culture: NO GROWTH
SPECIAL REQUESTS: ADEQUATE
Special Requests: ADEQUATE

## 2017-02-23 ENCOUNTER — Ambulatory Visit: Payer: BLUE CROSS/BLUE SHIELD | Admitting: Registered"

## 2018-10-15 ENCOUNTER — Emergency Department (HOSPITAL_COMMUNITY)
Admission: EM | Admit: 2018-10-15 | Discharge: 2018-10-15 | Disposition: A | Discharge status: Home / Self Care | Payer: BLUE CROSS/BLUE SHIELD | Attending: Emergency Medicine | Admitting: Emergency Medicine

## 2018-10-15 ENCOUNTER — Encounter (HOSPITAL_COMMUNITY): Payer: Self-pay | Admitting: Emergency Medicine

## 2018-10-15 ENCOUNTER — Emergency Department (HOSPITAL_COMMUNITY): Payer: BLUE CROSS/BLUE SHIELD

## 2018-10-15 DIAGNOSIS — Z7982 Long term (current) use of aspirin: Secondary | ICD-10-CM | POA: Insufficient documentation

## 2018-10-15 DIAGNOSIS — R079 Chest pain, unspecified: Secondary | ICD-10-CM | POA: Diagnosis present

## 2018-10-15 DIAGNOSIS — Z79899 Other long term (current) drug therapy: Secondary | ICD-10-CM | POA: Insufficient documentation

## 2018-10-15 DIAGNOSIS — Z7984 Long term (current) use of oral hypoglycemic drugs: Secondary | ICD-10-CM | POA: Insufficient documentation

## 2018-10-15 DIAGNOSIS — M546 Pain in thoracic spine: Secondary | ICD-10-CM | POA: Insufficient documentation

## 2018-10-15 DIAGNOSIS — E119 Type 2 diabetes mellitus without complications: Secondary | ICD-10-CM | POA: Insufficient documentation

## 2018-10-15 DIAGNOSIS — I10 Essential (primary) hypertension: Secondary | ICD-10-CM | POA: Diagnosis not present

## 2018-10-15 LAB — D-DIMER, QUANTITATIVE (NOT AT ARMC): D DIMER QUANT: 0.32 ug{FEU}/mL (ref 0.00–0.50)

## 2018-10-15 LAB — CBC
HEMATOCRIT: 36.1 % (ref 36.0–46.0)
HEMOGLOBIN: 11.3 g/dL — AB (ref 12.0–15.0)
MCH: 26.9 pg (ref 26.0–34.0)
MCHC: 31.3 g/dL (ref 30.0–36.0)
MCV: 86 fL (ref 80.0–100.0)
Platelets: 305 10*3/uL (ref 150–400)
RBC: 4.2 MIL/uL (ref 3.87–5.11)
RDW: 14.1 % (ref 11.5–15.5)
WBC: 7.6 10*3/uL (ref 4.0–10.5)
nRBC: 0 % (ref 0.0–0.2)

## 2018-10-15 LAB — BASIC METABOLIC PANEL
ANION GAP: 8 (ref 5–15)
BUN: 9 mg/dL (ref 6–20)
CHLORIDE: 104 mmol/L (ref 98–111)
CO2: 24 mmol/L (ref 22–32)
Calcium: 8.6 mg/dL — ABNORMAL LOW (ref 8.9–10.3)
Creatinine, Ser: 0.54 mg/dL (ref 0.44–1.00)
GFR calc Af Amer: >60 mL/min (ref >60)
GFR calc non Af Amer: >60 mL/min (ref >60)
GLUCOSE: 214 mg/dL — AB (ref 70–99)
POTASSIUM: 3.8 mmol/L (ref 3.5–5.1)
Sodium: 136 mmol/L (ref 135–145)

## 2018-10-15 LAB — I-STAT BETA HCG BLOOD, ED (MC, WL, AP ONLY): I-stat hCG, quantitative: <5 m[IU]/mL (ref <5)

## 2018-10-15 LAB — I-STAT TROPONIN, ED: TROPONIN I, POC: 0 ng/mL (ref 0.00–0.08)

## 2018-10-15 MED ORDER — ONDANSETRON HCL 4 MG/2ML IJ SOLN
4.0000 mg | Freq: Once | INTRAMUSCULAR | Status: AC
  Administered 2018-10-15: 4 mg via INTRAVENOUS
  Filled 2018-10-15: qty 2

## 2018-10-15 MED ORDER — NAPROXEN 500 MG PO TABS: 500.0000 mg | ORAL_TABLET | Freq: Two times a day (BID) | ORAL | 0 refills | Status: DC

## 2018-10-15 MED ORDER — HYDROCODONE-ACETAMINOPHEN 5-325 MG PO TABS: 1.0000 | ORAL_TABLET | Freq: Four times a day (QID) | ORAL | 0 refills | Status: DC | PRN

## 2018-10-15 MED ORDER — HYDROMORPHONE HCL 1 MG/ML IJ SOLN
0.5000 mg | Freq: Once | INTRAMUSCULAR | Status: AC
  Administered 2018-10-15: 0.5 mg via INTRAVENOUS
  Filled 2018-10-15: qty 1

## 2018-10-15 NOTE — ED Triage Notes (Signed)
Patient here from home with complaints of upper back pain under right shoulder blade radiating around to right chest that started while at work today. Pain increasingly worse.

## 2018-10-15 NOTE — ED Provider Notes (Signed)
[facility] DEPT Provider Note   CSN: [id number] Arrival date & time: 10/15/18  1349     History   Chief Complaint Chief Complaint  Patient presents with  . Back Pain  . Chest Pain    HPI Sara Chan is a 47 y.o. female.  Patient with history of diabetes, high blood pressure presents the emergency department today with complaints of right-sided back pain with radiation to her chest.  Symptoms started about 9 AM today.  No injury at onset.  Symptoms started at rest.  Pain is worse with movement and palpation of the area.  It is worse when she raises her arm.  No treatments prior to arrival.  No shortness of breath, diaphoresis, lightheadedness.  She denies any fever, cough.  No nausea, vomiting, or diarrhea.  Denies any acute injury.  Home medications have not been helping.     Past Medical History:  Diagnosis Date  . Arthritis   . Diabetes mellitus without complication ([place])   . Hypertension     There are no active problems to display for this patient.   Past Surgical History:  Procedure Laterality Date  . ADENOIDECTOMY    . BREAST REDUCTION SURGERY    . CESAREAN SECTION    . NASAL SEPTUM SURGERY    . NOVASURE ABLATION    . TONSILLECTOMY       OB History   No obstetric history on file.      Home Medications    Prior to Admission medications   Medication Sig Start Date End Date Taking? Authorizing Provider  amoxicillin (AMOXIL) 500 MG capsule Take 2 capsules (1,000 mg total) by mouth 2 (two) times daily. Patient not taking: Reported on 02/27/2016 04/06/15   Charlann Lange, PA-C  Aspirin-Salicylamide-Caffeine (BC HEADACHE PO) Take 1 packet by mouth daily as needed (pain).    [provider]  azithromycin (ZITHROMAX) 250 MG tablet Take 1 tablet (250 mg total) by mouth daily. Take first 2 tablets together, then 1 every day until finished. Patient not taking: Reported on 02/17/2017 10/19/16   Gloriann Loan, PA-C  benzonatate  (TESSALON) 100 MG capsule Take 1 capsule (100 mg total) by mouth 3 (three) times daily as needed for cough. Patient not taking: Reported on 02/17/2017 10/19/16   Gloriann Loan, PA-C  cephALEXin [facility]) 500 MG capsule 2 caps po bid x 7 days 02/17/17   Street, [place], PA-C  chlorpheniramine-HYDROcodone (TUSSIONEX PENNKINETIC ER) 10-8 MG/5ML SUER Take 5 mLs by mouth every 12 (twelve) hours as needed for cough. Patient not taking: Reported on 02/27/2016 04/06/15   Charlann Lange, PA-C  dicyclomine (BENTYL) 20 MG tablet Take 1 tablet (20 mg total) by mouth 2 (two) times daily as needed for spasms (abdominal spasms/diarrhea). 02/17/17   Street, [place], PA-C  HYDROcodone-acetaminophen (NORCO/VICODIN) 5-325 MG tablet Take 1-2 tablets by mouth every 6 hours as needed for pain and/or cough. Patient not taking: Reported on 02/27/2016 09/03/15   Pisciotta, Elmyra Ricks, PA-C  HYDROcodone-homatropine [facility]) 5-1.5 MG/5ML syrup Take 5 mLs by mouth every 4 (four) hours as needed for cough. Patient not taking: Reported on 02/17/2017 10/19/16   Gloriann Loan, PA-C  losartan-hydrochlorothiazide (HYZAAR) 100-12.5 MG per tablet Take 1 tablet by mouth daily. 03/04/15   [provider]  metFORMIN (GLUCOPHAGE) 500 MG tablet Take 500 mg by mouth daily.     [provider]  pravastatin (PRAVACHOL) 40 MG tablet TAKE 1 TABLET ONCE A DAY ORALLY 12/01/16   [provider]  simethicone (  MYLICON) 80 MG chewable tablet Chew 80 mg by mouth every 6 (six) hours as needed for flatulence.    [provider]    Family History No family history on file.  Social History Social History   Tobacco Use  . Smoking status: Former Smoker    Packs/day: 0.00    Last attempt to quit: 07/17/2014    Years since quitting: 4.2  . Smokeless tobacco: Never Used  Substance Use Topics  . Alcohol use: No    Comment: occ  . Drug use: No     Allergies   Codeine and Flagyl [metronidazole]   Review of Systems Review of  Systems  Constitutional: Negative for diaphoresis and fever.  Eyes: Negative for redness.  Respiratory: Negative for cough and shortness of breath.   Cardiovascular: Positive for chest pain. Negative for palpitations and leg swelling.  Gastrointestinal: Negative for abdominal pain, nausea and vomiting.  Genitourinary: Negative for dysuria.  Musculoskeletal: Positive for back pain. Negative for neck pain.  Skin: Negative for rash.  Neurological: Negative for syncope and light-headedness.  Psychiatric/Behavioral: The patient is not nervous/anxious.      Physical Exam Updated Vital Signs BP (!) 191/115 (BP Location: Left Arm)   Pulse 91   Temp 98.8 F (37.1 C) (Oral)   Resp 20   Ht 5' (1.524 m)   Wt 131.1 kg   SpO2 99%   BMI 56.44 kg/m   Physical Exam Vitals signs and nursing note reviewed.  Constitutional:      Appearance: She is well-developed. She is not diaphoretic.  HENT:     Head: Normocephalic and atraumatic.     Mouth/Throat:     Mouth: Mucous membranes are not dry.  Eyes:     Conjunctiva/sclera: Conjunctivae normal.  Neck:     Musculoskeletal: Normal range of motion and neck supple. No muscular tenderness.     Vascular: Normal carotid pulses. No carotid bruit or JVD.     Trachea: Trachea normal. No tracheal deviation.  Cardiovascular:     Rate and Rhythm: Normal rate and regular rhythm.     Pulses: No decreased pulses.     Heart sounds: Normal heart sounds, S1 normal and S2 normal. No murmur.  Pulmonary:     Effort: Pulmonary effort is normal. No respiratory distress.     Breath sounds: No wheezing.  Chest:     Chest wall: No tenderness.  Abdominal:     General: Bowel sounds are normal.     Palpations: Abdomen is soft.     Tenderness: There is no abdominal tenderness. There is no guarding or rebound.  Musculoskeletal: Normal range of motion.     Cervical back: She exhibits normal range of motion, no tenderness and no bony tenderness.     Thoracic back:  She exhibits tenderness. She exhibits normal range of motion and no bony tenderness.     Lumbar back: She exhibits normal range of motion, no tenderness and no bony tenderness.       Back:  Skin:    General: Skin is warm and dry.     Coloration: Skin is not pale.  Neurological:     Mental Status: She is alert.      ED Treatments / Results  Labs (all labs ordered are listed, but only abnormal results are displayed) Labs Reviewed  BASIC METABOLIC PANEL - Abnormal; Notable for the following components:      Result Value   Glucose, Bld 214 (*)    Calcium  8.6 (*)    All other components within normal limits  CBC - Abnormal; Notable for the following components:   Hemoglobin 11.3 (*)    All other components within normal limits  D-DIMER, QUANTITATIVE (NOT AT [facility])  I-STAT TROPONIN, ED  I-STAT BETA HCG BLOOD, ED ([facility], [facility], [facility] ONLY)    EKG EKG Interpretation  Date/Time:  Monday October 15 2018 14:02:39 EST Ventricular Rate:  95 PR Interval:    QRS Duration: 87 QT Interval:  341 QTC Calculation: 429 R Axis:   11 Text Interpretation:  Sinus rhythm Low voltage, precordial leads No significant change since last tracing Confirmed by Orlie Dakin [phone]) on 10/15/2018 2:12:17 PM Also confirmed by Orlie Dakin [phone]), editor Philomena Doheny [phone])  on 10/15/2018 3:56:36 PM   Radiology Dg Chest 2 View  Result Date: 10/15/2018 CLINICAL DATA:  Back pain. EXAM: CHEST - 2 VIEW COMPARISON:  10/19/2016. FINDINGS: Mediastinum and hilar structures normal. Cardiomegaly. No focal infiltrate. No pleural effusion or pneumothorax. No acute bony abnormality. IMPRESSION: 1.  Cardiomegaly.  No pulmonary venous congestion. 2.  No focal infiltrate. Electronically Signed   By: Marcello Moores  Register   On: 10/15/2018 14:53    Procedures Procedures (including critical care time)  Medications Ordered in ED Medications  HYDROmorphone (DILAUDID) injection 0.5 mg (0.5 mg Intravenous Given 10/15/18 2024)   ondansetron (ZOFRAN) injection 4 mg (4 mg Intravenous Given 10/15/18 2024)     Initial Impression / Assessment and Plan / ED Course  I have reviewed the triage vital signs and the nursing notes.  Pertinent labs & imaging results that were available during my care of the patient were reviewed by me and considered in my medical decision making (see chart for details).     Patient seen and examined. Work-up initiated. Medications ordered.  EKG reviewed by myself.  Will control pain.  D-dimer added to work-up.  Vital signs reviewed and are as follows: BP (!) 191/115 (BP Location: Left Arm)   Pulse 91   Temp 98.8 F (37.1 C) (Oral)   Resp 20   Ht 5' (1.524 m)   Wt 131.1 kg   SpO2 99%   BMI 56.44 kg/m   D-dimer was negative.  Patient's pain improved with treatment here.  She is comfortable discharged home at this time.  Will give short course of Vicodin, naproxen.  Also encouraged to rest to the area, heat as needed.  Encouraged patient to return with worsening symptoms including persistent chest pain, difficulty breathing, fever, new symptoms or other concerns.  Patient verbalizes understanding agrees with plan.  Final Clinical Impressions(s) / ED Diagnoses   Final diagnoses:  Acute right-sided thoracic back pain   Patient presents with complaint of right-sided scapular pain starting this morning and gradually worsening.  Pain is reproducible with palpation and movement.  Patient winces with palpation of the area.  Chest x-ray is clear.  EKG and troponin are not suggestive of ischemia.  D-dimer ordered and is negative.  At this point, exam and history and labs are most suggestive of musculoskeletal source of pain.  Will treat as such.  Return instructions discussed as above.   ED Discharge Orders         Ordered    HYDROcodone-acetaminophen (NORCO/VICODIN) 5-325 MG tablet  Every 6 hours PRN     10/15/18 2120    naproxen (NAPROSYN) 500 MG tablet  2 times daily     10/15/18 2120  Carlisle Cater, PA-C 10/15/18 2314    Quintella Reichert, MD 10/15/18 [phone]

## 2018-10-15 NOTE — Discharge Instructions (Signed)
Please read and follow all provided instructions.  Your diagnoses today include:  1. Acute left-sided thoracic back pain    Tests performed today include:  Vital signs - see below for your results today  Blood counts and electrolytes  Blood test for the heart -was negative  EKG-no signs of heart attack  Chest x-ray - no problems with the lungs  D-dimer test-screening test for blood clot was negative  Medications prescribed:   Vicodin (hydrocodone/acetaminophen) - narcotic pain medication  DO NOT drive or perform any activities that require you to be awake and alert because this medicine can make you drowsy. BE VERY CAREFUL not to take multiple medicines containing Tylenol (also called acetaminophen). Doing so can lead to an overdose which can damage your liver and cause liver failure and possibly death.   Naproxen - anti-inflammatory pain medication  Do not exceed 500mg  naproxen every 12 hours, take with food  You have been prescribed an anti-inflammatory medication or NSAID. Take with food. Take smallest effective dose for the shortest duration needed for your pain. Stop taking if you experience stomach pain or vomiting.   Take any prescribed medications only as directed.  Home care instructions:   Follow any educational materials contained in this packet  Please rest, use ice or heat on your back for the next several days  Do not lift, push, pull anything more than 10 pounds for the next week  Follow-up instructions: Please follow-up with your primary care provider in the next 1 week for further evaluation of your symptoms.   Return instructions:  SEEK IMMEDIATE MEDICAL ATTENTION IF YOU HAVE:  New numbness, tingling, weakness, or problem with the use of your arms or legs  Severe back pain not relieved with medications  Loss control of your bowels or bladder  Increasing pain in any areas of the body (such as chest or abdominal pain)  Shortness of breath,  dizziness, or fainting.   Worsening nausea (feeling sick to your stomach), vomiting, fever, or sweats  Any other emergent concerns regarding your health   Additional Information:  Your vital signs today were: BP 131/77 (BP Location: Left Arm)    Pulse 74    Temp 98.8 F (37.1 C) (Oral)    Resp 17    Ht 5' (1.524 m)    Wt 131.1 kg    SpO2 95%    BMI 56.44 kg/m  If your blood pressure (BP) was elevated above 135/85 this visit, please have this repeated by your doctor within one month. --------------

## 2018-10-29 ENCOUNTER — Encounter (HOSPITAL_COMMUNITY): Payer: Self-pay

## 2018-10-29 ENCOUNTER — Other Ambulatory Visit: Payer: Self-pay

## 2018-10-29 ENCOUNTER — Emergency Department (HOSPITAL_COMMUNITY)
Admission: EM | Admit: 2018-10-29 | Discharge: 2018-10-29 | Disposition: A | Discharge status: Home / Self Care | Payer: BLUE CROSS/BLUE SHIELD | Attending: Emergency Medicine | Admitting: Emergency Medicine

## 2018-10-29 DIAGNOSIS — Z87891 Personal history of nicotine dependence: Secondary | ICD-10-CM | POA: Diagnosis not present

## 2018-10-29 DIAGNOSIS — R05 Cough: Secondary | ICD-10-CM | POA: Diagnosis present

## 2018-10-29 DIAGNOSIS — E119 Type 2 diabetes mellitus without complications: Secondary | ICD-10-CM | POA: Insufficient documentation

## 2018-10-29 DIAGNOSIS — Z7984 Long term (current) use of oral hypoglycemic drugs: Secondary | ICD-10-CM | POA: Insufficient documentation

## 2018-10-29 DIAGNOSIS — J209 Acute bronchitis, unspecified: Secondary | ICD-10-CM | POA: Diagnosis not present

## 2018-10-29 DIAGNOSIS — I10 Essential (primary) hypertension: Secondary | ICD-10-CM | POA: Insufficient documentation

## 2018-10-29 MED ORDER — ALBUTEROL SULFATE HFA 108 (90 BASE) MCG/ACT IN AERS
2.0000 | INHALATION_SPRAY | RESPIRATORY_TRACT | Status: DC
  Administered 2018-10-29: 2 via RESPIRATORY_TRACT
  Filled 2018-10-29: qty 6.7

## 2018-10-29 MED ORDER — BENZONATATE 100 MG PO CAPS: 100.0000 mg | ORAL_CAPSULE | Freq: Three times a day (TID) | ORAL | 0 refills | Status: DC

## 2018-10-29 MED ORDER — DOXYCYCLINE HYCLATE 100 MG PO CAPS: 100.0000 mg | ORAL_CAPSULE | Freq: Two times a day (BID) | ORAL | 0 refills | Status: DC

## 2018-10-29 NOTE — ED Triage Notes (Signed)
Pt states she has had a cold over 1 week. Pt states she has had no relief with mucinex, alka seltzer cold plus, and delsum. Pt states she has had a dry cough and has lost her voice.

## 2018-10-29 NOTE — ED Provider Notes (Signed)
[place] DEPT Provider Note   CSN: [id number] Arrival date & time: 10/29/18  0844     History   Chief Complaint Chief Complaint  Patient presents with  . Cough    HPI Sara Chan is a 48 y.o. female.  HPI Patient is a 48 year old female presents the emergency department ongoing cold and cough over the past week.  She states no improvement with Mucinex and other over-the-counter medications.  She now has a hoarse voice.  She denies neck swelling.  No chest pain.  Mild productive cough.  No fevers or chills.  No shortness of breath.  Denies a history of asthma or COPD.  No use of bronchodilators.  Symptoms are moderate in severity   Past Medical History:  Diagnosis Date  . Arthritis   . Diabetes mellitus without complication ([place])   . Hypertension     There are no active problems to display for this patient.   Past Surgical History:  Procedure Laterality Date  . ADENOIDECTOMY    . BREAST REDUCTION SURGERY    . CESAREAN SECTION    . NASAL SEPTUM SURGERY    . NOVASURE ABLATION    . TONSILLECTOMY       OB History   No obstetric history on file.      Home Medications    Prior to Admission medications   Medication Sig Start Date End Date Taking? Authorizing Provider  acetaminophen (TYLENOL) 650 MG CR tablet Take 650 mg by mouth every 8 (eight) hours as needed for pain.    [provider]  benzonatate (TESSALON) 100 MG capsule Take 1 capsule (100 mg total) by mouth every 8 (eight) hours. 10/29/18   Jola Schmidt, MD  Canagliflozin-metFORMIN HCl (INVOKAMET) 50-500 MG TABS Take 1 tablet by mouth daily.    [provider]  doxycycline (VIBRAMYCIN) 100 MG capsule Take 1 capsule (100 mg total) by mouth 2 (two) times daily. 10/29/18   Jola Schmidt, MD  HYDROcodone-acetaminophen (NORCO/VICODIN) 5-325 MG tablet Take 1 tablet by mouth every 6 (six) hours as needed. 10/15/18   Carlisle Cater, PA-C  losartan-hydrochlorothiazide  (HYZAAR) 100-12.5 MG per tablet Take 1 tablet by mouth daily. 03/04/15   [provider]  naproxen (NAPROSYN) 500 MG tablet Take 1 tablet (500 mg total) by mouth 2 (two) times daily. 10/15/18   Carlisle Cater, PA-C  pravastatin (PRAVACHOL) 40 MG tablet Take 40 mg by mouth daily.  12/01/16   [provider]    Family History No family history on file.  Social History Social History   Tobacco Use  . Smoking status: Former Smoker    Packs/day: 0.00    Last attempt to quit: 07/17/2014    Years since quitting: 4.2  . Smokeless tobacco: Never Used  Substance Use Topics  . Alcohol use: No    Comment: occ  . Drug use: No     Allergies   Codeine and Flagyl [metronidazole]   Review of Systems Review of Systems  All other systems reviewed and are negative.    Physical Exam Updated Vital Signs BP (!) 178/97   Pulse 95   Temp 98.4 F (36.9 C) (Oral)   Resp 16   Ht 5' (1.524 m)   Wt 129.3 kg   SpO2 96%   BMI 55.66 kg/m   Physical Exam Vitals signs and nursing note reviewed.  Constitutional:      General: She is not in acute distress.    Appearance: She is well-developed.  HENT:     Head: Normocephalic and atraumatic.  Neck:     Musculoskeletal: Normal range of motion.  Cardiovascular:     Rate and Rhythm: Normal rate and regular rhythm.     Heart sounds: Normal heart sounds.  Pulmonary:     Effort: Pulmonary effort is normal.     Breath sounds: Normal breath sounds.  Abdominal:     General: There is no distension.     Palpations: Abdomen is soft.     Tenderness: There is no abdominal tenderness.  Musculoskeletal: Normal range of motion.  Skin:    General: Skin is warm and dry.  Neurological:     Mental Status: She is alert and oriented to person, place, and time.  Psychiatric:        Judgment: Judgment normal.      ED Treatments / Results  Labs (all labs ordered are listed, but only abnormal results are displayed) Labs Reviewed - No  data to display  EKG None  Radiology No results found.  Procedures Procedures (including critical care time)  Medications Ordered in ED Medications  albuterol (PROVENTIL HFA;VENTOLIN HFA) 108 (90 Base) MCG/ACT inhaler 2 puff (has no administration in time range)     Initial Impression / Assessment and Plan / ED Course  I have reviewed the triage vital signs and the nursing notes.  Pertinent labs & imaging results that were available during my care of the patient were reviewed by me and considered in my medical decision making (see chart for details).     Patient is overall well-appearing.  Bronchodilators and antibiotics for acute bronchitis and possibly developing pneumonia.  Vital signs are stable.  No hypoxia.  Close primary care follow-up.  If her symptoms do not improve in the next 3 to 5 days she will benefit from chest x-ray.  Patient encouraged to return the emergency department for new or worsening symptoms  Final Clinical Impressions(s) / ED Diagnoses   Final diagnoses:  Acute bronchitis, unspecified organism    ED Discharge Orders         Ordered    doxycycline (VIBRAMYCIN) 100 MG capsule  2 times daily     10/29/18 0921    benzonatate (TESSALON) 100 MG capsule  Every 8 hours     10/29/18 0921           Jola Schmidt, MD 10/29/18 [phone]

## 2021-10-06 ENCOUNTER — Inpatient Hospital Stay (HOSPITAL_COMMUNITY)
Admission: EM | Admit: 2021-10-06 | Discharge: 2021-10-19 | DRG: 207 | Disposition: A | Discharge status: Home / Self Care | Payer: BLUE CROSS/BLUE SHIELD | Attending: Internal Medicine | Admitting: Internal Medicine

## 2021-10-06 ENCOUNTER — Inpatient Hospital Stay (HOSPITAL_COMMUNITY): Payer: BLUE CROSS/BLUE SHIELD

## 2021-10-06 ENCOUNTER — Other Ambulatory Visit: Payer: Self-pay

## 2021-10-06 ENCOUNTER — Emergency Department (HOSPITAL_COMMUNITY): Payer: BLUE CROSS/BLUE SHIELD

## 2021-10-06 DIAGNOSIS — I129 Hypertensive chronic kidney disease with stage 1 through stage 4 chronic kidney disease, or unspecified chronic kidney disease: Secondary | ICD-10-CM | POA: Diagnosis not present

## 2021-10-06 DIAGNOSIS — Z8619 Personal history of other infectious and parasitic diseases: Secondary | ICD-10-CM

## 2021-10-06 DIAGNOSIS — X58XXXA Exposure to other specified factors, initial encounter: Secondary | ICD-10-CM | POA: Diagnosis present

## 2021-10-06 DIAGNOSIS — T380X5A Adverse effect of glucocorticoids and synthetic analogues, initial encounter: Secondary | ICD-10-CM | POA: Diagnosis not present

## 2021-10-06 DIAGNOSIS — Z885 Allergy status to narcotic agent status: Secondary | ICD-10-CM

## 2021-10-06 DIAGNOSIS — J9621 Acute and chronic respiratory failure with hypoxia: Secondary | ICD-10-CM | POA: Diagnosis present

## 2021-10-06 DIAGNOSIS — J15212 Pneumonia due to Methicillin resistant Staphylococcus aureus: Secondary | ICD-10-CM

## 2021-10-06 DIAGNOSIS — R0602 Shortness of breath: Secondary | ICD-10-CM

## 2021-10-06 DIAGNOSIS — R0609 Other forms of dyspnea: Secondary | ICD-10-CM | POA: Diagnosis not present

## 2021-10-06 DIAGNOSIS — J45901 Unspecified asthma with (acute) exacerbation: Secondary | ICD-10-CM | POA: Diagnosis present

## 2021-10-06 DIAGNOSIS — E1169 Type 2 diabetes mellitus with other specified complication: Secondary | ICD-10-CM

## 2021-10-06 DIAGNOSIS — E871 Hypo-osmolality and hyponatremia: Secondary | ICD-10-CM | POA: Diagnosis present

## 2021-10-06 DIAGNOSIS — E873 Alkalosis: Secondary | ICD-10-CM | POA: Diagnosis present

## 2021-10-06 DIAGNOSIS — Z978 Presence of other specified devices: Secondary | ICD-10-CM | POA: Diagnosis not present

## 2021-10-06 DIAGNOSIS — J9601 Acute respiratory failure with hypoxia: Secondary | ICD-10-CM

## 2021-10-06 DIAGNOSIS — T17890A Other foreign object in other parts of respiratory tract causing asphyxiation, initial encounter: Secondary | ICD-10-CM | POA: Diagnosis present

## 2021-10-06 DIAGNOSIS — R0902 Hypoxemia: Secondary | ICD-10-CM

## 2021-10-06 DIAGNOSIS — N179 Acute kidney failure, unspecified: Secondary | ICD-10-CM | POA: Diagnosis not present

## 2021-10-06 DIAGNOSIS — E8770 Fluid overload, unspecified: Secondary | ICD-10-CM | POA: Diagnosis present

## 2021-10-06 DIAGNOSIS — E876 Hypokalemia: Secondary | ICD-10-CM | POA: Diagnosis present

## 2021-10-06 DIAGNOSIS — Z452 Encounter for adjustment and management of vascular access device: Secondary | ICD-10-CM | POA: Diagnosis not present

## 2021-10-06 DIAGNOSIS — G9341 Metabolic encephalopathy: Secondary | ICD-10-CM | POA: Diagnosis present

## 2021-10-06 DIAGNOSIS — D6489 Other specified anemias: Secondary | ICD-10-CM | POA: Diagnosis present

## 2021-10-06 DIAGNOSIS — J9622 Acute and chronic respiratory failure with hypercapnia: Secondary | ICD-10-CM | POA: Diagnosis present

## 2021-10-06 DIAGNOSIS — Z20822 Contact with and (suspected) exposure to covid-19: Secondary | ICD-10-CM | POA: Diagnosis present

## 2021-10-06 DIAGNOSIS — Z6841 Body Mass Index (BMI) 40.0 and over, adult: Secondary | ICD-10-CM | POA: Diagnosis not present

## 2021-10-06 DIAGNOSIS — J9602 Acute respiratory failure with hypercapnia: Secondary | ICD-10-CM

## 2021-10-06 DIAGNOSIS — Z7984 Long term (current) use of oral hypoglycemic drugs: Secondary | ICD-10-CM

## 2021-10-06 DIAGNOSIS — R739 Hyperglycemia, unspecified: Secondary | ICD-10-CM | POA: Diagnosis not present

## 2021-10-06 DIAGNOSIS — I10 Essential (primary) hypertension: Secondary | ICD-10-CM | POA: Diagnosis present

## 2021-10-06 DIAGNOSIS — I161 Hypertensive emergency: Secondary | ICD-10-CM | POA: Diagnosis present

## 2021-10-06 DIAGNOSIS — Z87891 Personal history of nicotine dependence: Secondary | ICD-10-CM | POA: Diagnosis not present

## 2021-10-06 DIAGNOSIS — R7989 Other specified abnormal findings of blood chemistry: Secondary | ICD-10-CM | POA: Diagnosis present

## 2021-10-06 DIAGNOSIS — R570 Cardiogenic shock: Secondary | ICD-10-CM | POA: Diagnosis not present

## 2021-10-06 DIAGNOSIS — M1990 Unspecified osteoarthritis, unspecified site: Secondary | ICD-10-CM | POA: Diagnosis present

## 2021-10-06 DIAGNOSIS — E1165 Type 2 diabetes mellitus with hyperglycemia: Secondary | ICD-10-CM | POA: Diagnosis not present

## 2021-10-06 DIAGNOSIS — N183 Chronic kidney disease, stage 3 unspecified: Secondary | ICD-10-CM | POA: Diagnosis not present

## 2021-10-06 DIAGNOSIS — J810 Acute pulmonary edema: Secondary | ICD-10-CM | POA: Diagnosis present

## 2021-10-06 DIAGNOSIS — Z79899 Other long term (current) drug therapy: Secondary | ICD-10-CM

## 2021-10-06 DIAGNOSIS — J9811 Atelectasis: Secondary | ICD-10-CM | POA: Diagnosis present

## 2021-10-06 DIAGNOSIS — J9690 Respiratory failure, unspecified, unspecified whether with hypoxia or hypercapnia: Secondary | ICD-10-CM

## 2021-10-06 DIAGNOSIS — Z881 Allergy status to other antibiotic agents status: Secondary | ICD-10-CM

## 2021-10-06 DIAGNOSIS — J4551 Severe persistent asthma with (acute) exacerbation: Secondary | ICD-10-CM | POA: Diagnosis not present

## 2021-10-06 DIAGNOSIS — E662 Morbid (severe) obesity with alveolar hypoventilation: Secondary | ICD-10-CM | POA: Diagnosis not present

## 2021-10-06 DIAGNOSIS — E1122 Type 2 diabetes mellitus with diabetic chronic kidney disease: Secondary | ICD-10-CM | POA: Diagnosis not present

## 2021-10-06 LAB — POCT I-STAT 7, (LYTES, BLD GAS, ICA,H+H)
Acid-base deficit: 7 mmol/L — ABNORMAL HIGH (ref 0.0–2.0)
Acid-base deficit: 7 mmol/L — ABNORMAL HIGH (ref 0.0–2.0)
Bicarbonate: 23.3 mmol/L (ref 20.0–28.0)
Bicarbonate: 19.3 mmol/L — ABNORMAL LOW (ref 20.0–28.0)
Calcium, Ion: 1.16 mmol/L (ref 1.15–1.40)
Calcium, Ion: 1.09 mmol/L — ABNORMAL LOW (ref 1.15–1.40)
HCT: 37 % (ref 36.0–46.0)
HCT: 39 % (ref 36.0–46.0)
Hemoglobin: 12.6 g/dL (ref 12.0–15.0)
Hemoglobin: 13.3 g/dL (ref 12.0–15.0)
O2 Saturation: 94 %
O2 Saturation: 98 %
Patient temperature: 99.2
Patient temperature: 99.4
Potassium: 4.9 mmol/L (ref 3.5–5.1)
Potassium: 4.4 mmol/L (ref 3.5–5.1)
Sodium: 140 mmol/L (ref 135–145)
Sodium: 137 mmol/L (ref 135–145)
TCO2: 25 mmol/L (ref 22–32)
TCO2: 20 mmol/L — ABNORMAL LOW (ref 22–32)
pCO2 arterial: 72.3 mmHg (ref 32.0–48.0)
pCO2 arterial: 40.7 mmHg (ref 32.0–48.0)
pH, Arterial: 7.119 — CL (ref 7.350–7.450)
pH, Arterial: 7.285 — ABNORMAL LOW (ref 7.350–7.450)
pO2, Arterial: 97 mmHg (ref 83.0–108.0)
pO2, Arterial: 112 mmHg — ABNORMAL HIGH (ref 83.0–108.0)

## 2021-10-06 LAB — I-STAT VENOUS BLOOD GAS, ED
Acid-base deficit: 5 mmol/L — ABNORMAL HIGH (ref 0.0–2.0)
Bicarbonate: 24.5 mmol/L (ref 20.0–28.0)
Calcium, Ion: 0.93 mmol/L — ABNORMAL LOW (ref 1.15–1.40)
HCT: 36 % (ref 36.0–46.0)
Hemoglobin: 12.2 g/dL (ref 12.0–15.0)
O2 Saturation: 96 %
Potassium: 3.9 mmol/L (ref 3.5–5.1)
Sodium: 143 mmol/L (ref 135–145)
TCO2: 26 mmol/L (ref 22–32)
pCO2, Ven: 65 mmHg — ABNORMAL HIGH (ref 44.0–60.0)
pH, Ven: 7.185 — CL (ref 7.250–7.430)
pO2, Ven: 105 mmHg — ABNORMAL HIGH (ref 32.0–45.0)

## 2021-10-06 LAB — ECHOCARDIOGRAM COMPLETE
Height: 62 in
Weight: 4432 oz

## 2021-10-06 LAB — GLUCOSE, CAPILLARY
Glucose-Capillary: 406 mg/dL — ABNORMAL HIGH (ref 70–99)
Glucose-Capillary: 411 mg/dL — ABNORMAL HIGH (ref 70–99)
Glucose-Capillary: 365 mg/dL — ABNORMAL HIGH (ref 70–99)
Glucose-Capillary: 374 mg/dL — ABNORMAL HIGH (ref 70–99)
Glucose-Capillary: 375 mg/dL — ABNORMAL HIGH (ref 70–99)
Glucose-Capillary: 295 mg/dL — ABNORMAL HIGH (ref 70–99)
Glucose-Capillary: 218 mg/dL — ABNORMAL HIGH (ref 70–99)
Glucose-Capillary: 219 mg/dL — ABNORMAL HIGH (ref 70–99)
Glucose-Capillary: 210 mg/dL — ABNORMAL HIGH (ref 70–99)
Glucose-Capillary: 184 mg/dL — ABNORMAL HIGH (ref 70–99)
Glucose-Capillary: 151 mg/dL — ABNORMAL HIGH (ref 70–99)

## 2021-10-06 LAB — CBC WITH DIFFERENTIAL/PLATELET
Abs Immature Granulocytes: 0.12 10*3/uL — ABNORMAL HIGH (ref 0.00–0.07)
Basophils Absolute: 0 10*3/uL (ref 0.0–0.1)
Basophils Relative: 0 %
Eosinophils Absolute: 0.3 10*3/uL (ref 0.0–0.5)
Eosinophils Relative: 2 %
HCT: 37.6 % (ref 36.0–46.0)
Hemoglobin: 11.7 g/dL — ABNORMAL LOW (ref 12.0–15.0)
Immature Granulocytes: 1 %
Lymphocytes Relative: 7 %
Lymphs Abs: 1.2 10*3/uL (ref 0.7–4.0)
MCH: 27.8 pg (ref 26.0–34.0)
MCHC: 31.1 g/dL (ref 30.0–36.0)
MCV: 89.3 fL (ref 80.0–100.0)
Monocytes Absolute: 0.4 10*3/uL (ref 0.1–1.0)
Monocytes Relative: 2 %
Neutro Abs: 15.6 10*3/uL — ABNORMAL HIGH (ref 1.7–7.7)
Neutrophils Relative %: 88 %
Platelets: 328 10*3/uL (ref 150–400)
RBC: 4.21 MIL/uL (ref 3.87–5.11)
RDW: 14.5 % (ref 11.5–15.5)
WBC: 17.6 10*3/uL — ABNORMAL HIGH (ref 4.0–10.5)
nRBC: 0 % (ref 0.0–0.2)

## 2021-10-06 LAB — COMPREHENSIVE METABOLIC PANEL
ALT: 21 U/L (ref 0–44)
AST: 28 U/L (ref 15–41)
Albumin: 3.6 g/dL (ref 3.5–5.0)
Alkaline Phosphatase: 71 U/L (ref 38–126)
Anion gap: 11 (ref 5–15)
BUN: 9 mg/dL (ref 6–20)
CO2: 23 mmol/L (ref 22–32)
Calcium: 8.8 mg/dL — ABNORMAL LOW (ref 8.9–10.3)
Chloride: 100 mmol/L (ref 98–111)
Creatinine, Ser: 0.7 mg/dL (ref 0.44–1.00)
GFR, Estimated: >60 mL/min (ref >60)
Glucose, Bld: 323 mg/dL — ABNORMAL HIGH (ref 70–99)
Potassium: 4.1 mmol/L (ref 3.5–5.1)
Sodium: 134 mmol/L — ABNORMAL LOW (ref 135–145)
Total Bilirubin: 1.1 mg/dL (ref 0.3–1.2)
Total Protein: 6.9 g/dL (ref 6.5–8.1)

## 2021-10-06 LAB — HIV ANTIBODY (ROUTINE TESTING W REFLEX): HIV Screen 4th Generation wRfx: NONREACTIVE

## 2021-10-06 LAB — BRAIN NATRIURETIC PEPTIDE: B Natriuretic Peptide: 27.2 pg/mL (ref 0.0–100.0)

## 2021-10-06 LAB — I-STAT ARTERIAL BLOOD GAS, ED
Acid-base deficit: 6 mmol/L — ABNORMAL HIGH (ref 0.0–2.0)
Bicarbonate: 26.8 mmol/L (ref 20.0–28.0)
Calcium, Ion: 1.2 mmol/L (ref 1.15–1.40)
HCT: 37 % (ref 36.0–46.0)
Hemoglobin: 12.6 g/dL (ref 12.0–15.0)
O2 Saturation: 99 %
Patient temperature: 99.3
Potassium: 4.5 mmol/L (ref 3.5–5.1)
Sodium: 140 mmol/L (ref 135–145)
TCO2: 30 mmol/L (ref 22–32)
pCO2 arterial: 103.4 mmHg (ref 32.0–48.0)
pH, Arterial: 7.024 — CL (ref 7.350–7.450)
pO2, Arterial: 254 mmHg — ABNORMAL HIGH (ref 83.0–108.0)

## 2021-10-06 LAB — URINALYSIS, ROUTINE W REFLEX MICROSCOPIC
Bacteria, UA: NONE SEEN
Bilirubin Urine: NEGATIVE
Glucose, UA: >=500 mg/dL — AB
Hgb urine dipstick: NEGATIVE
Ketones, ur: 20 mg/dL — AB
Leukocytes,Ua: NEGATIVE
Nitrite: NEGATIVE
Protein, ur: 100 mg/dL — AB
Specific Gravity, Urine: 1.029 (ref 1.005–1.030)
pH: 5 (ref 5.0–8.0)

## 2021-10-06 LAB — RESP PANEL BY RT-PCR (FLU A&B, COVID) ARPGX2
Influenza A by PCR: NEGATIVE
Influenza B by PCR: NEGATIVE
SARS Coronavirus 2 by RT PCR: NEGATIVE

## 2021-10-06 LAB — PROTIME-INR
INR: 1 (ref 0.8–1.2)
Prothrombin Time: 13 seconds (ref 11.4–15.2)

## 2021-10-06 LAB — CREATININE, SERUM
Creatinine, Ser: 0.8 mg/dL (ref 0.44–1.00)
GFR, Estimated: >60 mL/min (ref >60)

## 2021-10-06 LAB — BASIC METABOLIC PANEL
Anion gap: 9 (ref 5–15)
BUN: 11 mg/dL (ref 6–20)
CO2: 22 mmol/L (ref 22–32)
Calcium: 8.3 mg/dL — ABNORMAL LOW (ref 8.9–10.3)
Chloride: 107 mmol/L (ref 98–111)
Creatinine, Ser: 1.02 mg/dL — ABNORMAL HIGH (ref 0.44–1.00)
GFR, Estimated: >60 mL/min (ref >60)
Glucose, Bld: 206 mg/dL — ABNORMAL HIGH (ref 70–99)
Potassium: 3.2 mmol/L — ABNORMAL LOW (ref 3.5–5.1)
Sodium: 138 mmol/L (ref 135–145)

## 2021-10-06 LAB — CBC
HCT: 40.4 % (ref 36.0–46.0)
Hemoglobin: 12.7 g/dL (ref 12.0–15.0)
MCH: 27.4 pg (ref 26.0–34.0)
MCHC: 31.4 g/dL (ref 30.0–36.0)
MCV: 87.3 fL (ref 80.0–100.0)
Platelets: 275 10*3/uL (ref 150–400)
RBC: 4.63 MIL/uL (ref 3.87–5.11)
RDW: 14.5 % (ref 11.5–15.5)
WBC: 8.6 10*3/uL (ref 4.0–10.5)
nRBC: 0 % (ref 0.0–0.2)

## 2021-10-06 LAB — BLOOD GAS, ARTERIAL
Acid-base deficit: 2.4 mmol/L — ABNORMAL HIGH (ref 0.0–2.0)
Bicarbonate: 21.4 mmol/L (ref 20.0–28.0)
FIO2: 60
O2 Saturation: 99.7 %
Patient temperature: 38.3
pCO2 arterial: 36.2 mmHg (ref 32.0–48.0)
pH, Arterial: 7.397 (ref 7.350–7.450)
pO2, Arterial: 152 mmHg — ABNORMAL HIGH (ref 83.0–108.0)

## 2021-10-06 LAB — RAPID URINE DRUG SCREEN, HOSP PERFORMED
Amphetamines: NOT DETECTED
Barbiturates: NOT DETECTED
Benzodiazepines: NOT DETECTED
Cocaine: NOT DETECTED
Opiates: NOT DETECTED
Tetrahydrocannabinol: NOT DETECTED

## 2021-10-06 LAB — PHOSPHORUS: Phosphorus: 7.2 mg/dL — ABNORMAL HIGH (ref 2.5–4.6)

## 2021-10-06 LAB — LACTIC ACID, PLASMA
Lactic Acid, Venous: 1.7 mmol/L (ref 0.5–1.9)
Lactic Acid, Venous: 2.8 mmol/L (ref 0.5–1.9)

## 2021-10-06 LAB — TSH: TSH: 1.48 u[IU]/mL (ref 0.350–4.500)

## 2021-10-06 LAB — STREP PNEUMONIAE URINARY ANTIGEN: Strep Pneumo Urinary Antigen: NEGATIVE

## 2021-10-06 LAB — CORTISOL: Cortisol, Plasma: 27.3 ug/dL

## 2021-10-06 LAB — HEMOGLOBIN A1C
Hgb A1c MFr Bld: 9.9 % — ABNORMAL HIGH (ref 4.8–5.6)
Mean Plasma Glucose: 237.43 mg/dL

## 2021-10-06 LAB — MAGNESIUM: Magnesium: 1.6 mg/dL — ABNORMAL LOW (ref 1.7–2.4)

## 2021-10-06 LAB — TROPONIN I (HIGH SENSITIVITY)
Troponin I (High Sensitivity): 10 ng/L (ref <18)
Troponin I (High Sensitivity): 14 ng/L (ref <18)

## 2021-10-06 LAB — MRSA NEXT GEN BY PCR, NASAL: MRSA by PCR Next Gen: NOT DETECTED

## 2021-10-06 LAB — PROCALCITONIN: Procalcitonin: <0.1 ng/mL

## 2021-10-06 LAB — APTT: aPTT: 27 seconds (ref 24–36)

## 2021-10-06 MED ORDER — POLYETHYLENE GLYCOL 3350 17 G PO PACK: 17.0000 g | PACK | Freq: Every day | ORAL | Status: DC | PRN

## 2021-10-06 MED ORDER — NITROGLYCERIN IN D5W 200-5 MCG/ML-% IV SOLN
0.0000 ug/min | INTRAVENOUS | Status: DC
Start: 2021-10-06 — End: 2021-10-06
  Administered 2021-10-06: 10:00:00 100 ug/min via INTRAVENOUS

## 2021-10-06 MED ORDER — MIDAZOLAM-SODIUM CHLORIDE 100-0.9 MG/100ML-% IV SOLN
0.5000 mg/h | INTRAVENOUS | Status: DC
  Administered 2021-10-06: 11:00:00 2 mg/h via INTRAVENOUS
  Filled 2021-10-06: qty 100

## 2021-10-06 MED ORDER — POLYETHYLENE GLYCOL 3350 17 G PO PACK
17.0000 g | PACK | Freq: Every day | ORAL | Status: DC
  Administered 2021-10-06 – 2021-10-07 (×2): 17 g
  Filled 2021-10-06 (×2): qty 1

## 2021-10-06 MED ORDER — FENTANYL CITRATE (PF) 100 MCG/2ML IJ SOLN: 50.0000 ug | Freq: Once | INTRAMUSCULAR | Status: DC

## 2021-10-06 MED ORDER — ETOMIDATE 2 MG/ML IV SOLN
INTRAVENOUS | Status: AC | PRN
  Administered 2021-10-06: 20 mg via INTRAVENOUS

## 2021-10-06 MED ORDER — SODIUM CHLORIDE 0.9 % IV SOLN
2.0000 g | Freq: Once | INTRAVENOUS | Status: AC
  Administered 2021-10-06: 07:00:00 2 g via INTRAVENOUS
  Filled 2021-10-06: qty 20

## 2021-10-06 MED ORDER — AMLODIPINE BESYLATE 5 MG PO TABS
5.0000 mg | ORAL_TABLET | Freq: Every day | ORAL | Status: DC
  Administered 2021-10-06 – 2021-10-07 (×2): 5 mg
  Filled 2021-10-06 (×3): qty 1

## 2021-10-06 MED ORDER — VANCOMYCIN HCL 10 G IV SOLR
2500.0000 mg | Freq: Once | INTRAVENOUS | Status: AC
  Administered 2021-10-06: 12:00:00 2500 mg via INTRAVENOUS
  Filled 2021-10-06: qty 2500

## 2021-10-06 MED ORDER — FENTANYL CITRATE (PF) 100 MCG/2ML IJ SOLN: 50.0000 ug | INTRAMUSCULAR | Status: DC | PRN

## 2021-10-06 MED ORDER — VANCOMYCIN HCL 1750 MG/350ML IV SOLN
1750.0000 mg | INTRAVENOUS | Status: DC
  Filled 2021-10-06: qty 350

## 2021-10-06 MED ORDER — FUROSEMIDE 10 MG/ML IJ SOLN
80.0000 mg | Freq: Once | INTRAMUSCULAR | Status: AC
  Administered 2021-10-06: 11:00:00 80 mg via INTRAVENOUS
  Filled 2021-10-06: qty 8

## 2021-10-06 MED ORDER — SUCCINYLCHOLINE CHLORIDE 20 MG/ML IJ SOLN
INTRAMUSCULAR | Status: AC | PRN
  Administered 2021-10-06: 120 mg via INTRAVENOUS

## 2021-10-06 MED ORDER — DOCUSATE SODIUM 50 MG/5ML PO LIQD: 100.0000 mg | Freq: Two times a day (BID) | ORAL | Status: DC | PRN

## 2021-10-06 MED ORDER — POTASSIUM CHLORIDE 20 MEQ PO PACK
20.0000 meq | PACK | ORAL | Status: AC
  Administered 2021-10-06 – 2021-10-07 (×2): 20 meq
  Filled 2021-10-06 (×2): qty 1

## 2021-10-06 MED ORDER — SODIUM CHLORIDE 0.9 % IV SOLN
500.0000 mg | INTRAVENOUS | Status: DC
  Administered 2021-10-06 – 2021-10-08 (×3): 500 mg via INTRAVENOUS
  Filled 2021-10-06 (×3): qty 5

## 2021-10-06 MED ORDER — SODIUM CHLORIDE 0.9 % IV SOLN: INTRAVENOUS | Status: DC | PRN

## 2021-10-06 MED ORDER — PANTOPRAZOLE SODIUM 40 MG IV SOLR
40.0000 mg | Freq: Every day | INTRAVENOUS | Status: DC
  Administered 2021-10-06 – 2021-10-14 (×9): 40 mg via INTRAVENOUS
  Filled 2021-10-06 (×10): qty 40

## 2021-10-06 MED ORDER — DOCUSATE SODIUM 100 MG PO CAPS: 100.0000 mg | ORAL_CAPSULE | Freq: Two times a day (BID) | ORAL | Status: DC | PRN

## 2021-10-06 MED ORDER — FENTANYL CITRATE PF 50 MCG/ML IJ SOSY: 50.0000 ug | PREFILLED_SYRINGE | INTRAMUSCULAR | Status: DC | PRN

## 2021-10-06 MED ORDER — FENTANYL CITRATE PF 50 MCG/ML IJ SOSY
100.0000 ug | PREFILLED_SYRINGE | INTRAMUSCULAR | Status: DC | PRN
  Administered 2021-10-06 (×2): 100 ug via INTRAVENOUS
  Filled 2021-10-06 (×2): qty 2

## 2021-10-06 MED ORDER — SODIUM CHLORIDE 0.9 % IV SOLN
1.0000 g | INTRAVENOUS | Status: DC
  Administered 2021-10-07: 07:00:00 1 g via INTRAVENOUS
  Filled 2021-10-06: qty 10

## 2021-10-06 MED ORDER — DOCUSATE SODIUM 50 MG/5ML PO LIQD
100.0000 mg | Freq: Two times a day (BID) | ORAL | Status: DC
  Administered 2021-10-06 – 2021-10-14 (×17): 100 mg
  Filled 2021-10-06 (×17): qty 10

## 2021-10-06 MED ORDER — MAGNESIUM SULFATE 2 GM/50ML IV SOLN
2.0000 g | Freq: Once | INTRAVENOUS | Status: AC
  Administered 2021-10-06: 09:00:00 2 g via INTRAVENOUS
  Filled 2021-10-06: qty 50

## 2021-10-06 MED ORDER — FENTANYL 2500MCG IN NS 250ML (10MCG/ML) PREMIX INFUSION
50.0000 ug/h | INTRAVENOUS | Status: DC
  Administered 2021-10-06: 15:00:00 50 ug/h via INTRAVENOUS
  Administered 2021-10-07: 11:00:00 150 ug/h via INTRAVENOUS
  Administered 2021-10-07 – 2021-10-13 (×12): 200 ug/h via INTRAVENOUS
  Filled 2021-10-06 (×14): qty 250

## 2021-10-06 MED ORDER — PANTOPRAZOLE SODIUM 40 MG IV SOLR: 40.0000 mg | Freq: Every day | INTRAVENOUS | Status: DC

## 2021-10-06 MED ORDER — HYDRALAZINE HCL 20 MG/ML IJ SOLN
10.0000 mg | Freq: Once | INTRAMUSCULAR | Status: AC
  Administered 2021-10-06: 09:00:00 10 mg via INTRAVENOUS
  Filled 2021-10-06: qty 1

## 2021-10-06 MED ORDER — MIDAZOLAM HCL 2 MG/2ML IJ SOLN: 2.0000 mg | INTRAMUSCULAR | Status: DC | PRN

## 2021-10-06 MED ORDER — ALBUTEROL SULFATE (2.5 MG/3ML) 0.083% IN NEBU
10.0000 mg/h | INHALATION_SOLUTION | Freq: Once | RESPIRATORY_TRACT | Status: AC
  Administered 2021-10-06: 09:00:00 10 mg/h via RESPIRATORY_TRACT
  Filled 2021-10-06: qty 12

## 2021-10-06 MED ORDER — DEXTROSE 50 % IV SOLN
0.0000 mL | INTRAVENOUS | Status: DC | PRN
  Administered 2021-10-12: 03:00:00 50 mL via INTRAVENOUS
  Filled 2021-10-06: qty 50

## 2021-10-06 MED ORDER — METHYLPREDNISOLONE SODIUM SUCC 125 MG IJ SOLR
125.0000 mg | Freq: Once | INTRAMUSCULAR | Status: AC
  Administered 2021-10-06: 09:00:00 125 mg via INTRAVENOUS
  Filled 2021-10-06: qty 2

## 2021-10-06 MED ORDER — LACTATED RINGERS IV SOLN: INTRAVENOUS | Status: DC

## 2021-10-06 MED ORDER — ORAL CARE MOUTH RINSE
15.0000 mL | OROMUCOSAL | Status: DC
  Administered 2021-10-06 – 2021-10-14 (×78): 15 mL via OROMUCOSAL

## 2021-10-06 MED ORDER — INSULIN REGULAR(HUMAN) IN NACL 100-0.9 UT/100ML-% IV SOLN
INTRAVENOUS | Status: AC
  Administered 2021-10-06: 14:00:00 19 [IU]/h via INTRAVENOUS
  Administered 2021-10-06: 23:00:00 12 [IU]/h via INTRAVENOUS
  Administered 2021-10-06: 17:00:00 30 [IU]/h via INTRAVENOUS
  Filled 2021-10-06 (×3): qty 100

## 2021-10-06 MED ORDER — SODIUM CHLORIDE 0.9 % IV BOLUS: 1000.0000 mL | Freq: Once | INTRAVENOUS | Status: DC

## 2021-10-06 MED ORDER — POTASSIUM CHLORIDE 10 MEQ/100ML IV SOLN
10.0000 meq | INTRAVENOUS | Status: AC
  Administered 2021-10-06 – 2021-10-07 (×4): 10 meq via INTRAVENOUS
  Filled 2021-10-06 (×4): qty 100

## 2021-10-06 MED ORDER — MIDAZOLAM HCL 2 MG/2ML IJ SOLN
1.0000 mg | Freq: Once | INTRAMUSCULAR | Status: AC
  Administered 2021-10-06: 08:00:00 1 mg via INTRAVENOUS
  Filled 2021-10-06: qty 2

## 2021-10-06 MED ORDER — PROPOFOL 1000 MG/100ML IV EMUL
0.0000 ug/kg/min | INTRAVENOUS | Status: DC
  Administered 2021-10-06: 19:00:00 35 ug/kg/min via INTRAVENOUS
  Administered 2021-10-06 (×2): 50 ug/kg/min via INTRAVENOUS
  Administered 2021-10-06: 25 ug/kg/min via INTRAVENOUS
  Administered 2021-10-06: 15:00:00 40 ug/kg/min via INTRAVENOUS
  Administered 2021-10-07: 16:00:00 35 ug/kg/min via INTRAVENOUS
  Administered 2021-10-07: 23:00:00 50 ug/kg/min via INTRAVENOUS
  Administered 2021-10-07 (×4): 35 ug/kg/min via INTRAVENOUS
  Administered 2021-10-08: 23:00:00 50 ug/kg/min via INTRAVENOUS
  Administered 2021-10-08: 05:00:00 35 ug/kg/min via INTRAVENOUS
  Administered 2021-10-08: 15:00:00 30 ug/kg/min via INTRAVENOUS
  Administered 2021-10-08: 08:00:00 50 ug/kg/min via INTRAVENOUS
  Administered 2021-10-08: 03:00:00 35 ug/kg/min via INTRAVENOUS
  Administered 2021-10-08: 12:00:00 40 ug/kg/min via INTRAVENOUS
  Administered 2021-10-08 – 2021-10-09 (×11): 50 ug/kg/min via INTRAVENOUS
  Administered 2021-10-10: 11:00:00 40 ug/kg/min via INTRAVENOUS
  Administered 2021-10-10: 02:00:00 50 ug/kg/min via INTRAVENOUS
  Administered 2021-10-10: 22:00:00 35 ug/kg/min via INTRAVENOUS
  Administered 2021-10-10 (×3): 30 ug/kg/min via INTRAVENOUS
  Administered 2021-10-10: 05:00:00 50 ug/kg/min via INTRAVENOUS
  Administered 2021-10-11: 21:00:00 35 ug/kg/min via INTRAVENOUS
  Administered 2021-10-11: 05:00:00 40 ug/kg/min via INTRAVENOUS
  Administered 2021-10-11: 02:00:00 35 ug/kg/min via INTRAVENOUS
  Administered 2021-10-11: 14:00:00 30 ug/kg/min via INTRAVENOUS
  Administered 2021-10-11 – 2021-10-12 (×4): 35 ug/kg/min via INTRAVENOUS
  Administered 2021-10-12: 06:00:00 40 ug/kg/min via INTRAVENOUS
  Administered 2021-10-12 (×2): 50 ug/kg/min via INTRAVENOUS
  Administered 2021-10-12: 19:00:00 35 ug/kg/min via INTRAVENOUS
  Administered 2021-10-12: 10:00:00 40 ug/kg/min via INTRAVENOUS
  Administered 2021-10-13: 03:00:00 30 ug/kg/min via INTRAVENOUS
  Administered 2021-10-13: 06:00:00 25 ug/kg/min via INTRAVENOUS
  Filled 2021-10-06: qty 100
  Filled 2021-10-06: qty 200
  Filled 2021-10-06 (×2): qty 100
  Filled 2021-10-06: qty 300
  Filled 2021-10-06: qty 100
  Filled 2021-10-06: qty 200
  Filled 2021-10-06: qty 100
  Filled 2021-10-06: qty 300
  Filled 2021-10-06: qty 100
  Filled 2021-10-06: qty 300
  Filled 2021-10-06 (×2): qty 100
  Filled 2021-10-06: qty 300
  Filled 2021-10-06 (×15): qty 100
  Filled 2021-10-06: qty 300
  Filled 2021-10-06: qty 200
  Filled 2021-10-06 (×2): qty 100
  Filled 2021-10-06: qty 300

## 2021-10-06 MED ORDER — HEPARIN SODIUM (PORCINE) 5000 UNIT/ML IJ SOLN
5000.0000 [IU] | Freq: Three times a day (TID) | INTRAMUSCULAR | Status: DC
  Administered 2021-10-06 – 2021-10-19 (×39): 5000 [IU] via SUBCUTANEOUS
  Filled 2021-10-06 (×39): qty 1

## 2021-10-06 MED ORDER — DEXTROSE IN LACTATED RINGERS 5 % IV SOLN: INTRAVENOUS | Status: DC

## 2021-10-06 MED ORDER — SODIUM CHLORIDE 0.9 % IV BOLUS
1000.0000 mL | Freq: Once | INTRAVENOUS | Status: AC
  Administered 2021-10-06: 07:00:00 1000 mL via INTRAVENOUS

## 2021-10-06 MED ORDER — FENTANYL CITRATE PF 50 MCG/ML IJ SOSY
50.0000 ug | PREFILLED_SYRINGE | INTRAMUSCULAR | Status: DC | PRN
Start: 2021-10-06 — End: 2021-10-06

## 2021-10-06 MED ORDER — SODIUM CHLORIDE 0.9 % IV SOLN: INTRAVENOUS | Status: DC

## 2021-10-06 MED ORDER — CLEVIDIPINE BUTYRATE 0.5 MG/ML IV EMUL
0.0000 mg/h | INTRAVENOUS | Status: DC
  Administered 2021-10-06: 14:00:00 1 mg/h via INTRAVENOUS
  Filled 2021-10-06 (×3): qty 50

## 2021-10-06 MED ORDER — FENTANYL CITRATE PF 50 MCG/ML IJ SOSY: 100.0000 ug | PREFILLED_SYRINGE | INTRAMUSCULAR | Status: DC | PRN

## 2021-10-06 MED ORDER — LOSARTAN POTASSIUM 50 MG PO TABS
100.0000 mg | ORAL_TABLET | Freq: Every day | ORAL | Status: DC
  Administered 2021-10-06 – 2021-10-07 (×2): 100 mg
  Filled 2021-10-06 (×4): qty 2

## 2021-10-06 MED ORDER — PROPOFOL 1000 MG/100ML IV EMUL
0.0000 ug/kg/min | INTRAVENOUS | Status: DC
  Administered 2021-10-06: 10:00:00 10 ug/kg/min via INTRAVENOUS
  Filled 2021-10-06: qty 100

## 2021-10-06 MED ORDER — CHLORHEXIDINE GLUCONATE CLOTH 2 % EX PADS
6.0000 | MEDICATED_PAD | Freq: Every day | CUTANEOUS | Status: DC
  Administered 2021-10-06 – 2021-10-15 (×9): 6 via TOPICAL

## 2021-10-06 MED ORDER — MIDAZOLAM HCL 2 MG/2ML IJ SOLN
2.0000 mg | INTRAMUSCULAR | Status: DC | PRN
  Administered 2021-10-06 (×2): 2 mg via INTRAVENOUS
  Filled 2021-10-06 (×2): qty 2

## 2021-10-06 MED ORDER — PROPOFOL 1000 MG/100ML IV EMUL
INTRAVENOUS | Status: AC | PRN
  Administered 2021-10-06: 1256 ug via INTRAVENOUS

## 2021-10-06 MED ORDER — ETOMIDATE 2 MG/ML IV SOLN
INTRAVENOUS | Status: AC
  Filled 2021-10-06: qty 10

## 2021-10-06 MED ORDER — FENTANYL BOLUS VIA INFUSION
50.0000 ug | INTRAVENOUS | Status: DC | PRN
  Administered 2021-10-06 – 2021-10-07 (×9): 50 ug via INTRAVENOUS
  Administered 2021-10-08: 02:00:00 75 ug via INTRAVENOUS
  Administered 2021-10-08 (×2): 50 ug via INTRAVENOUS
  Administered 2021-10-09 – 2021-10-13 (×4): 100 ug via INTRAVENOUS
  Filled 2021-10-06: qty 100

## 2021-10-06 MED ORDER — FENTANYL CITRATE (PF) 100 MCG/2ML IJ SOLN: 100.0000 ug | INTRAMUSCULAR | Status: DC | PRN

## 2021-10-06 MED ORDER — IPRATROPIUM-ALBUTEROL 0.5-2.5 (3) MG/3ML IN SOLN
3.0000 mL | Freq: Once | RESPIRATORY_TRACT | Status: AC
  Administered 2021-10-06: 07:00:00 3 mL via RESPIRATORY_TRACT
  Filled 2021-10-06: qty 3

## 2021-10-06 MED ORDER — MAGNESIUM SULFATE 4 GM/100ML IV SOLN
4.0000 g | Freq: Once | INTRAVENOUS | Status: AC
  Administered 2021-10-06: 23:00:00 4 g via INTRAVENOUS
  Filled 2021-10-06: qty 100

## 2021-10-06 MED ORDER — SUCCINYLCHOLINE CHLORIDE 200 MG/10ML IV SOSY
PREFILLED_SYRINGE | INTRAVENOUS | Status: AC
  Filled 2021-10-06: qty 10

## 2021-10-06 MED ORDER — CHLORHEXIDINE GLUCONATE 0.12% ORAL RINSE (MEDLINE KIT)
15.0000 mL | Freq: Two times a day (BID) | OROMUCOSAL | Status: DC
  Administered 2021-10-06 – 2021-10-14 (×16): 15 mL via OROMUCOSAL

## 2021-10-06 MED ORDER — SODIUM CHLORIDE 0.9 % IV BOLUS
500.0000 mL | Freq: Once | INTRAVENOUS | Status: AC
  Administered 2021-10-06: 09:00:00 500 mL via INTRAVENOUS

## 2021-10-06 NOTE — Progress Notes (Signed)
RT made ventilator adjustments per ABG/EDP. RT will obtain repeat ABG in approximately one hour.

## 2021-10-06 NOTE — Progress Notes (Signed)
Placed oral airway/bite block in patient's mouth to help with pt not biting down on ET tube.

## 2021-10-06 NOTE — ED Notes (Signed)
Spoke with MD about work of breathing and tachy.  Recommend patient on BIPAP RT notified.

## 2021-10-06 NOTE — ED Provider Notes (Addendum)
Physical Exam  BP (!) 240/123    Pulse (!) 132    Temp 99.2 F (37.3 C) (Axillary)    Resp 18    Ht 5\' 2"  (1.575 m)    Wt 125.6 kg    SpO2 100%    BMI 50.66 kg/m   Physical Exam  ED Course/Procedures   Clinical Course as of 10/06/21 1130  Wed Oct 06, 2021  0923 pH, Ven(!!): 7.185 Change the BiPAP settings to 18/6. Pulling in tidal volume of 250-300 cc and breathing about 25 times a minute.  ICU team will see the patient.  Arterial line being placed.  Patient has received multiple rounds of nebulizer treatment, Solu-Medrol and is getting magnesium infused right now.  [AN]  1029 Art line confirmed elevated BP. MAP in the 160s-170s.  Nitro drip was initiated.  Patient also became more somnolent.  She was pulling it about 100 cc last of tidal volume.  Decision was made to intubate her at that time.  It appears that patient could be having primarily  pulmonary edema and the hypercapnic respiratory failure could be secondary to her getting tired.  Given her morbid obesity, BNP can be falsely negative.  Family has consented for art line, intubation and central line verbally. [AN]  1110 pH, Arterial(!!): 7.024 ABGs worse.  Her x-ray did reveal that patient had been intubated in the right main bronchus.  ET tube was pulled back 3 cm just few minutes back.  Her FiO2 has been reduced to 40.  We will bring down the PEEP to 5 from 8.  Tidal volume is set at 400 cc which is 8 cc/kg for her.  We will increase the respiratory rate to 25.   BP has improved significantly.  Still requiring further assistance with sedation.  We will add Versed drip. [AN]    Clinical Course User Index [AN] Varney Biles, MD    Procedure Name: Intubation Date/Time: 10/06/2021 10:31 AM Performed by: Varney Biles, MD Pre-anesthesia Checklist: Patient identified, Patient being monitored, Emergency Drugs available, Timeout performed and Suction available Oxygen Delivery Method: Simple face mask Preoxygenation:  Pre-oxygenation with 100% oxygen Induction Type: Rapid sequence Ventilation: Mask ventilation without difficulty Laryngoscope Size: Glidescope and 3 Grade View: Grade II Tube size: 7.5 mm Number of attempts: 2 Placement Confirmation: ETT inserted through vocal cords under direct vision, CO2 detector and Breath sounds checked- equal and bilateral Secured at: 26 cm Tube secured with: ETT holder    .Critical Care Performed by: Varney Biles, MD Authorized by: Varney Biles, MD   Critical care provider statement:    Critical care time (minutes):  56   Critical care was necessary to treat or prevent imminent or life-threatening deterioration of the following conditions:  Respiratory failure and circulatory failure   Critical care was time spent personally by me on the following activities:  Development of treatment plan with patient or surrogate, discussions with consultants, evaluation of patient's response to treatment, examination of patient, ordering and review of laboratory studies, ordering and review of radiographic studies, ordering and performing treatments and interventions, pulse oximetry, re-evaluation of patient's condition and review of old charts  MDM   Assuming care of patient from Dr. Sedonia Small   Patient in the ED for shortness of breath. Noted to be tachycardic and slightly low O2. Workup thus far shows : normal CBC  Concerning findings are as following : tachycardia, rhonchorous breath sounds. Important pending results are : CT PE  According to Dr. Sedonia Small, plan is  to admit post workup   Patient had no complains, no concerns from the nursing side. Will continue to monitor.    Varney Biles, MD 10/06/21 [phone]   Patient has been reassessed on multiple occasions.  At my initial assessment patient was noted to be tachypneic.  She has diffuse wheezing and rhonchorous breath sounds.  She uses CPAP machine at night.  We decided that it might be best for her to be placed  on BiPAP for work of breathing.   Over time, patient continues to have respiratory distress.  BiPAP did help her with the work of breathing, but she remained tachypneic.  Labs are negative for COVID and flu.  X-ray was reviewed and interpreted independently by me.  There is no clear evidence of pulmonary vascular congestion, pneumonia.  Patient does not have any history of PE, DVT and no risk factors for the same.  She has no known history of asthma or COPD.  Her troponin and BNP are overall reassuring -reducing the chance of acute CHF or even a large PE.  With patient's work-up being reassuring, we attempted to give her some Versed to see if that would help her tolerate the CT scan.  Unfortunately, worse that did not have any significant positive impact on the patient's respiratory status.  She continues to feel extremely short of breath when she is laying flat.  Patient's BP has steadily gone up and her map is now in the 140s, even after Versed.  We will have to abort the CT plans for now.  I am adding TSH, lactic acid and venous blood gas to her work-up.  We will give her magnesium.  It appears that she never received Solu-Medrol therefore she will receive a dose of that as well right now.  Leading diagnosis is hypertensive emergency.  Arterial line to be ordered.   Varney Biles, MD 10/06/21 1751    Varney Biles, MD 10/06/21 1130

## 2021-10-06 NOTE — ED Provider Notes (Signed)
[facility] Emergency Department Provider Note MRN:  [id number]  Arrival date & time: 10/06/21     Chief Complaint   Shortness of Breath   History of Present Illness   Sara Chan is a 50 y.o. year-old female with a history of HTN, diabetes presenting to the ED with chief complaint of shortness of breath.  Respiratory complaints for about 2 months.  Has been sick since November.  Had trouble getting over the flu.  Then kept having cough, shortness of breath, Being diagnosed with bronchitis.  Finished a course of steroids and antibiotics and was better for a time but upon completing the prescriptions got worse again.  Feeling very short of breath, having some chest tightness.  Denies any abdominal pain, no leg pain or swelling, no other complaints.  Symptoms constant, moderate, worse when ambulating.  Review of Systems  A complete 10 system review of systems was obtained and all systems are negative except as noted in the HPI and PMH.   Patient's Health History    Past Medical History:  Diagnosis Date   Arthritis    Diabetes mellitus without complication ([place])    Hypertension     Past Surgical History:  Procedure Laterality Date   ADENOIDECTOMY     BREAST REDUCTION SURGERY     CESAREAN SECTION     NASAL SEPTUM SURGERY     NOVASURE ABLATION     TONSILLECTOMY      No family history on file.  Social History   Socioeconomic History   Marital status: Single    Spouse name: Not on file   Number of children: Not on file   Years of education: Not on file   Highest education level: Not on file  Occupational History   Not on file  Tobacco Use   Smoking status: Former    Packs/day: 0.00    Types: Cigarettes    Quit date: 07/17/2014    Years since quitting: 7.2   Smokeless tobacco: Never  Substance and Sexual Activity   Alcohol use: No    Comment: occ   Drug use: No   Sexual activity: Yes    Birth control/protection: None  Other Topics Concern    Not on file  Social History Narrative   Not on file   Social Determinants of Health   Financial Resource Strain: Not on file  Food Insecurity: Not on file  Transportation Needs: Not on file  Physical Activity: Not on file  Stress: Not on file  Social Connections: Not on file  Intimate Partner Violence: Not on file     Physical Exam   Vitals:   10/06/21 0559 10/06/21 0600  BP:  (!) 148/98  Pulse:  (!) 133  Resp:  (!) 26  Temp:  99.2 F (37.3 C)  SpO2: 95% 95%    CONSTITUTIONAL: Well-appearing, in mild respiratory distress NEURO:  Alert and oriented x 3, no focal deficits EYES:  eyes equal and reactive ENT/NECK:  no LAD, no JVD CARDIO: Regular rate, well-perfused, normal S1 and S2 PULM: Diffuse rhonchi GI/GU:  normal bowel sounds, non-distended, non-tender MSK/SPINE:  No gross deformities, no edema SKIN:  no rash, atraumatic PSYCH:  Appropriate speech and behavior  *Additional and/or pertinent findings included in MDM below  Diagnostic and Interventional Summary    EKG Interpretation  Date/Time:  Wednesday October 06 2021 06:00:57 EST Ventricular Rate:  139 PR Interval:  140 QRS Duration: 77 QT Interval:  281 QTC Calculation: 428 R Axis:  47 Text Interpretation: Sinus tachycardia Multiple ventricular premature complexes Borderline T abnormalities, inferior leads Confirmed by Gerlene Fee [phone]) on 10/06/2021 6:24:27 AM       Labs Reviewed  CULTURE, BLOOD (SINGLE)  RESP PANEL BY RT-PCR (FLU A&B, COVID) ARPGX2  BRAIN NATRIURETIC PEPTIDE  CBC  COMPREHENSIVE METABOLIC PANEL  TROPONIN I (HIGH SENSITIVITY)    DG Chest Port 1 View    (Results Pending)  CT Angio Chest Pulmonary Embolism (PE) W or WO Contrast    (Results Pending)    Medications  sodium chloride 0.9 % bolus 1,000 mL (has no administration in time range)  ipratropium-albuterol (DUONEB) 0.5-2.5 (3) MG/3ML nebulizer solution 3 mL (has no administration in time range)     Procedures  /   Critical Care Procedures  ED Course and Medical Decision Making  I have reviewed the triage vital signs, the nursing notes, and pertinent available records from the EMR.  Listed above are laboratory and imaging tests that I personally ordered, reviewed, and interpreted and then considered in my medical decision making (see below for details).  Persistent infectious symptoms regarding the lungs.  Suspect pneumonia, also considering CHF.  Also considering underlying pulmonary malignancy, PE.  Will obtain labs, initial plain film chest x-ray to exclude pneumothorax, will pursue CT imaging.  Patient tachycardic in the 130s, tachypneic, providing breathing treatment, fluids, will consider adding on antibiotics, suspect will need admission.  Signed out to oncoming provider at shift change.       Barth Kirks. [place] Small, [place] [email or web address]  Final Clinical Impressions(s) / ED Diagnoses     ICD-10-CM   1. Shortness of breath  R06.02       ED Discharge Orders     None        Discharge Instructions Discussed with and Provided to Patient:   Discharge Instructions   None       Maudie Flakes, MD 10/06/21 [phone]

## 2021-10-06 NOTE — H&P (Addendum)
NAME:  Sara Chan, MRN:  [id number], DOB:  1971/07/17, LOS: 0 ADMISSION DATE:  10/06/2021, CONSULTATION DATE: 10/06/2021 REFERRING MD: Emergency department physician, CHIEF COMPLAINT: Acute respiratory failure hypertensive emergency  History of Present Illness:  50 year old female who is known to have flu approximately 2 months ago and has never returned to her normal pulmonary baseline.  Patient has been experiencing increasing shortness of breath of the last 2 months without fevers chills or sweats.  He does have known hypertension and was noted to have a hypertensive emergency on admission.  She was subsequently intubated and pulmonary critical care was asked to admit for further evaluation and treatment  Pertinent  Medical History   Past Medical History:  Diagnosis Date   Arthritis    Diabetes mellitus without complication ([place])    Hypertension      Significant Hospital Events: Including procedures, antibiotic start and stop dates in addition to other pertinent events   10/06/2021 intubated by emergency department physician 10/06/2021 pulmonary critical care evaluation and admission  Interim History / Subjective:  Middle-aged female morbidly obese worsening respiratory distress intubated by the emergency department physician noted to have hypertensive emergency.  Objective   Blood pressure (!) 240/123, pulse (!) 130, temperature 99.2 F (37.3 C), temperature source Axillary, resp. rate (!) 23, height 5' (1.524 m), weight 125.6 kg, SpO2 98 %.        Intake/Output Summary (Last 24 hours) at 10/06/2021 8676 Last data filed at 10/06/2021 7209 Gross per 24 hour  Intake 1150 ml  Output --  Net 1150 ml   Filed Weights   10/06/21 0602  Weight: 125.6 kg    Examination: General: Morbid obese female HENT: Full facemask in place Lungs: Decreased breath sounds throughout Cardiovascular: Heart sounds are regular Abdomen: Obese nontender Extremities: 2+ edema Neuro:  Responds to voice follows commands weakly GU: Amber urine  Resolved Hospital Problem list     Assessment & Plan:  Acute respiratory failure in the setting of hypertensive emergency complicated by morbid obesity and suspected volume overload.  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 Intubation in the emergency department by the emergency department physician 10/06/2021 at 1000 hrs. Serial ABGs Wean FiO2 as able Sedation for tube tolerance Bronchodilators as needed Diuresis as tolerated 2D echo Empirical antimicrobial therapy with vancomycin and ceftriaxone Sedation protocol  Hypertensive emergency possible component of noncompliance with medication concern for substance abuse. Transition of nitroglycerin to Cleviprex As needed hydralazine Sedation 2D echo  Diabetes mellitus CBG (last 3)  No results for input(s): GLUCAP in the last 72 hours. Sliding-scale insulin  Morbid obesity Weight loss  Best Practice (right click and "Reselect all SmartList Selections" daily)   Diet/type: NPO DVT prophylaxis: prophylactic heparin  GI prophylaxis: N/A Lines: N/A Foley:  N/A Code Status:  full code Last date of multidisciplinary goals of care discussion [tbd]  Labs   CBC: Recent Labs  Lab 10/06/21 0620 10/06/21 0912  WBC 8.6  --   HGB 12.7 12.2  HCT 40.4 36.0  MCV 87.3  --   PLT 275  --     Basic Metabolic Panel: Recent Labs  Lab 10/06/21 0620 10/06/21 0912  NA 134* 143  K 4.1 3.9  CL 100  --   CO2 23  --   GLUCOSE 323*  --   BUN 9  --   CREATININE 0.70  --   CALCIUM 8.8*  --    GFR: Estimated Creatinine Clearance: 100.6 mL/min (  by C-G formula based on SCr of 0.7 mg/dL). Recent Labs  Lab 10/06/21 0620 10/06/21 0834  WBC 8.6  --   LATICACIDVEN  --  1.7    Liver Function Tests: Recent Labs  Lab 10/06/21 0620  AST 28  ALT 21  ALKPHOS 71  BILITOT 1.1  PROT 6.9  ALBUMIN 3.6   No results for input(s): LIPASE,  AMYLASE in the last 168 hours. No results for input(s): AMMONIA in the last 168 hours.  ABG    Component Value Date/Time   HCO3 24.5 10/06/2021 0912   TCO2 26 10/06/2021 0912   ACIDBASEDEF 5.0 (H) 10/06/2021 0912   O2SAT 96.0 10/06/2021 0912     Coagulation Profile: No results for input(s): INR, PROTIME in the last 168 hours.  Cardiac Enzymes: No results for input(s): CKTOTAL, CKMB, CKMBINDEX, TROPONINI in the last 168 hours.  HbA1C: No results found for: HGBA1C  CBG: No results for input(s): GLUCAP in the last 168 hours.  Review of Systems:   na  Past Medical History:  She,  has a past medical history of Arthritis, Diabetes mellitus without complication ([place]), and Hypertension.   Surgical History:   Past Surgical History:  Procedure Laterality Date   ADENOIDECTOMY     BREAST REDUCTION SURGERY     CESAREAN SECTION     NASAL SEPTUM SURGERY     NOVASURE ABLATION     TONSILLECTOMY       Social History:   reports that she quit smoking about 7 years ago. She has never used smokeless tobacco. She reports that she does not drink alcohol and does not use drugs.   Family History:  Her family history is not on file.   Allergies Allergies  Allergen Reactions   Codeine Nausea And Vomiting   Flagyl [Metronidazole] Hives     Home Medications  Prior to Admission medications   Medication Sig Start Date End Date Taking? Authorizing Provider  acetaminophen (TYLENOL) 650 MG CR tablet Take 650 mg by mouth every 8 (eight) hours as needed for pain.    [provider]  benzonatate (TESSALON) 100 MG capsule Take 1 capsule (100 mg total) by mouth every 8 (eight) hours. 10/29/18   Jola Schmidt, MD  Canagliflozin-metFORMIN HCl (INVOKAMET) 50-500 MG TABS Take 1 tablet by mouth daily.    [provider]  dextromethorphan-guaiFENesin (MUCINEX DM) 30-600 MG 12hr tablet Take 1 tablet by mouth 2 (two) times daily as needed for cough.    [provider]   doxycycline (VIBRAMYCIN) 100 MG capsule Take 1 capsule (100 mg total) by mouth 2 (two) times daily. 10/29/18   Jola Schmidt, MD  guaiFENesin (ROBITUSSIN) 100 MG/5ML liquid Take 200 mg by mouth 3 (three) times daily as needed for cough.    [provider]  HYDROcodone-acetaminophen (NORCO/VICODIN) 5-325 MG tablet Take 1 tablet by mouth every 6 (six) hours as needed. Patient not taking: Reported on 10/29/2018 10/15/18   Carlisle Cater, PA-C  losartan-hydrochlorothiazide (HYZAAR) 100-12.5 MG per tablet Take 1 tablet by mouth daily. 03/04/15   [provider]  naproxen (NAPROSYN) 500 MG tablet Take 1 tablet (500 mg total) by mouth 2 (two) times daily. Patient not taking: Reported on 10/29/2018 10/15/18   Carlisle Cater, PA-C  pravastatin (PRAVACHOL) 40 MG tablet Take 40 mg by mouth daily.  12/01/16   [provider]     Critical care time: 61min     Richardson Landry Dreyah Montrose ACNP Acute Care Nurse Practitioner [place] Please  consult Amion 10/06/2021, 9:59 AM

## 2021-10-06 NOTE — Progress Notes (Signed)
Adjusted RR and FIO2 per ABG results. RN made aware.

## 2021-10-06 NOTE — Progress Notes (Signed)
[place] Progress Note Patient Name: Sara Chan DOB: 1970/12/11 MRN: [id number]   Date of Service  10/06/2021  HPI/Events of Note  Hypotension - BP = 87/45 by A-line. No CVL or CVP. LVEF = 60-65%.  eICU Interventions  Plan: Bolus with 0.9 NaCl 1 liter IV over 1 hour now.      Intervention Category Major Interventions: Hypotension - evaluation and management  [name] 10/06/2021, 9:07 PM

## 2021-10-06 NOTE — Care Management (Signed)
°  Transition of Care [facility]) Screening Note   Patient Details  Name: Kimbree Casanas Date of Birth: 1971-04-20   Transition of Care [facility]) CM/SW Contact:    Carles Collet, RN Phone Number: 10/06/2021, 3:53 PM    Transition of Care Department [facility]) has reviewed patient. We will continue to monitor patient advancement through interdisciplinary progression rounds. If new patient transition needs arise, please place a TOC consult.

## 2021-10-06 NOTE — ED Notes (Signed)
MD and PA at bedside with RT to insert Art Line to right wrist

## 2021-10-06 NOTE — ED Triage Notes (Signed)
Pt BIB [facility] from home c/o [facility]. Pt in and out of appointments for respiratory infections with no improvement, even with antibiotics/meds. Increased shob with movement. EMS gave duoneb and albuterol. A&Ox4

## 2021-10-06 NOTE — ED Notes (Signed)
RT pulled ET back 3cm

## 2021-10-06 NOTE — Progress Notes (Signed)
°  Echocardiogram 2D Echocardiogram has been performed.  Sara Chan 10/06/2021, 2:17 PM

## 2021-10-06 NOTE — Progress Notes (Signed)
Inpatient Diabetes Program Recommendations  AACE/ADA: New Consensus Statement on Inpatient Glycemic Control (2015)  Target Ranges:  Prepandial:   less than 140 mg/dL      Peak postprandial:   less than 180 mg/dL (1-2 hours)      Critically ill patients:  140 - 180 mg/dL   Lab Results  Component Value Date   GLUCAP 311 (H) 02/27/2016    Review of Glycemic Control  Latest Reference Range & Units 10/06/21 06:20  Glucose 70 - 99 mg/dL 323 (H)   Diabetes history: DM 2 Outpatient Diabetes medications:  Invokamet 50-500 mg daily Current orders for Inpatient glycemic control:  None yet  Inpatient Diabetes Program Recommendations:    Please add Novolog correction q 4 hours.   Thanks,  Adah Perl, RN, BC-ADM Inpatient Diabetes Coordinator Pager [phone]  (8a-5p)

## 2021-10-06 NOTE — Progress Notes (Addendum)
Pharmacy Antibiotic Note  Sara Chan is a 50 y.o. female admitted on 10/06/2021 with acute respiratory failure in the setting of hypertensive emergency with concern of pneumonia. Pt has been started on ceftriaxone and azithromycin per MD. Pharmacy has been consulted for Vancomycin dosing.  Scr - 0.70 at baseline, WBC WNL.   Plan: Vancomycin 2500 mg IV x 1, then Vancomycin 1750 mg IV q24hr (eAUC: 477.2, Scr 0.8 used).  Follow-up clinical course, WBC, renal function. Deescalate when able. Levels at steady state.   Height: 5' (152.4 cm) Weight: 125.6 kg (277 lb) IBW/kg (Calculated) : 45.5  Temp (24hrs), Avg:99.2 F (37.3 C), Min:99.2 F (37.3 C), Max:99.2 F (37.3 C)  Recent Labs  Lab 10/06/21 0620 10/06/21 0834  WBC 8.6  --   CREATININE 0.70  --   LATICACIDVEN  --  1.7    Estimated Creatinine Clearance: 100.6 mL/min (by C-G formula based on SCr of 0.7 mg/dL).    Allergies  Allergen Reactions   Codeine Nausea And Vomiting   Flagyl [Metronidazole] Hives    Antimicrobials this admission: Vancomycin 12/21 >> Ceftriaxone 12/21 >> Azithromycin 12/21 >>  Microbiology results: 12/21 BCx: pending 12/21 UCx: pending   Thank you for allowing pharmacy to be a part of this patients care.  Sara Chan 10/06/2021 10:36 AM

## 2021-10-06 NOTE — ED Provider Notes (Signed)
[facility]  Date/Time: 10/06/2021 9:42 AM Performed by: Tedd Sias, PA Authorized by: Tedd Sias, PA   Consent:    Consent obtained:  Emergent situation   Consent given by:  Patient Universal protocol:    Procedure explained and questions answered to patient or proxy's satisfaction: yes     Relevant documents present and verified: yes     Test results available: yes     Imaging studies available: yes     Required blood products, implants, devices, and special equipment available: yes     Site/side marked: yes     Immediately prior to procedure, a time out was called: yes     Patient identity confirmed:  Arm band Indications:    Indications: hemodynamic monitoring and multiple ABGs   Pre-procedure details:    Skin preparation:  Chlorhexidine   Preparation: Patient was prepped and draped in sterile fashion   Sedation:    Sedation type:  None Anesthesia:    Anesthesia method:  Local infiltration   Local anesthetic:  Lidocaine 1% w/o epi Procedure details:    Location:  R radial   Allen's test performed: yes     Needle gauge:  20 G   Placement technique: Dart.   Number of attempts:  1   Transducer: waveform confirmed   Post-procedure details:    Post-procedure:  Biopatch applied, sutured and sterile dressing applied   CMS:  Unchanged and normal   Procedure completion:  Allegra Grana, PA 10/06/21 [place], Ankit, MD 10/06/21 1332

## 2021-10-06 NOTE — Progress Notes (Signed)
Date and time results received: 10/06/21 1220   Test: lactic acid Critical Value: 2.8  Name of Provider Notified: Dr. Shearon Stalls  Orders Received? Or Actions Taken?: No new orders given at this time

## 2021-10-07 ENCOUNTER — Inpatient Hospital Stay (HOSPITAL_COMMUNITY): Payer: BLUE CROSS/BLUE SHIELD

## 2021-10-07 DIAGNOSIS — R739 Hyperglycemia, unspecified: Secondary | ICD-10-CM

## 2021-10-07 LAB — BASIC METABOLIC PANEL
Anion gap: 8 (ref 5–15)
Anion gap: 10 (ref 5–15)
BUN: 11 mg/dL (ref 6–20)
BUN: 12 mg/dL (ref 6–20)
CO2: 21 mmol/L — ABNORMAL LOW (ref 22–32)
CO2: 22 mmol/L (ref 22–32)
Calcium: 8.1 mg/dL — ABNORMAL LOW (ref 8.9–10.3)
Calcium: 8.2 mg/dL — ABNORMAL LOW (ref 8.9–10.3)
Chloride: 105 mmol/L (ref 98–111)
Chloride: 106 mmol/L (ref 98–111)
Creatinine, Ser: 0.96 mg/dL (ref 0.44–1.00)
Creatinine, Ser: 1.02 mg/dL — ABNORMAL HIGH (ref 0.44–1.00)
GFR, Estimated: >60 mL/min (ref >60)
GFR, Estimated: >60 mL/min (ref >60)
Glucose, Bld: 180 mg/dL — ABNORMAL HIGH (ref 70–99)
Glucose, Bld: 174 mg/dL — ABNORMAL HIGH (ref 70–99)
Potassium: 3.6 mmol/L (ref 3.5–5.1)
Potassium: 3.6 mmol/L (ref 3.5–5.1)
Sodium: 134 mmol/L — ABNORMAL LOW (ref 135–145)
Sodium: 138 mmol/L (ref 135–145)

## 2021-10-07 LAB — BLOOD GAS, ARTERIAL
Acid-base deficit: 1.8 mmol/L (ref 0.0–2.0)
Bicarbonate: 21.8 mmol/L (ref 20.0–28.0)
Drawn by: 38235
FIO2: 40
O2 Saturation: 98.2 %
Patient temperature: 37
pCO2 arterial: 33.1 mmHg (ref 32.0–48.0)
pH, Arterial: 7.434 (ref 7.350–7.450)
pO2, Arterial: 93.6 mmHg (ref 83.0–108.0)

## 2021-10-07 LAB — MAGNESIUM
Magnesium: 2.5 mg/dL — ABNORMAL HIGH (ref 1.7–2.4)
Magnesium: 2.1 mg/dL (ref 1.7–2.4)
Magnesium: 2.3 mg/dL (ref 1.7–2.4)

## 2021-10-07 LAB — CBC
HCT: 32.6 % — ABNORMAL LOW (ref 36.0–46.0)
Hemoglobin: 10.9 g/dL — ABNORMAL LOW (ref 12.0–15.0)
MCH: 27.9 pg (ref 26.0–34.0)
MCHC: 33.4 g/dL (ref 30.0–36.0)
MCV: 83.4 fL (ref 80.0–100.0)
Platelets: 271 10*3/uL (ref 150–400)
RBC: 3.91 MIL/uL (ref 3.87–5.11)
RDW: 14.6 % (ref 11.5–15.5)
WBC: 12.4 10*3/uL — ABNORMAL HIGH (ref 4.0–10.5)
nRBC: 0 % (ref 0.0–0.2)

## 2021-10-07 LAB — URINE CULTURE: Culture: <10000 — AB

## 2021-10-07 LAB — PHOSPHORUS
Phosphorus: <1 mg/dL — CL (ref 2.5–4.6)
Phosphorus: 3.2 mg/dL (ref 2.5–4.6)
Phosphorus: 4.3 mg/dL (ref 2.5–4.6)

## 2021-10-07 LAB — GLUCOSE, CAPILLARY
Glucose-Capillary: 155 mg/dL — ABNORMAL HIGH (ref 70–99)
Glucose-Capillary: 155 mg/dL — ABNORMAL HIGH (ref 70–99)
Glucose-Capillary: 158 mg/dL — ABNORMAL HIGH (ref 70–99)
Glucose-Capillary: 162 mg/dL — ABNORMAL HIGH (ref 70–99)
Glucose-Capillary: 169 mg/dL — ABNORMAL HIGH (ref 70–99)
Glucose-Capillary: 170 mg/dL — ABNORMAL HIGH (ref 70–99)
Glucose-Capillary: 177 mg/dL — ABNORMAL HIGH (ref 70–99)
Glucose-Capillary: 224 mg/dL — ABNORMAL HIGH (ref 70–99)
Glucose-Capillary: 234 mg/dL — ABNORMAL HIGH (ref 70–99)
Glucose-Capillary: 248 mg/dL — ABNORMAL HIGH (ref 70–99)
Glucose-Capillary: 93 mg/dL (ref 70–99)

## 2021-10-07 LAB — TRIGLYCERIDES: Triglycerides: 219 mg/dL — ABNORMAL HIGH (ref <150)

## 2021-10-07 MED ORDER — PROSOURCE TF PO LIQD
45.0000 mL | Freq: Every day | ORAL | Status: DC
  Administered 2021-10-08 – 2021-10-13 (×6): 45 mL
  Filled 2021-10-07 (×7): qty 45

## 2021-10-07 MED ORDER — VITAL HIGH PROTEIN PO LIQD
1000.0000 mL | ORAL | Status: DC
  Administered 2021-10-07: 14:00:00 1000 mL

## 2021-10-07 MED ORDER — IPRATROPIUM-ALBUTEROL 0.5-2.5 (3) MG/3ML IN SOLN
RESPIRATORY_TRACT | Status: AC
  Administered 2021-10-07: 3 mL
  Filled 2021-10-07: qty 3

## 2021-10-07 MED ORDER — INSULIN DETEMIR 100 UNIT/ML ~~LOC~~ SOLN
26.0000 [IU] | Freq: Two times a day (BID) | SUBCUTANEOUS | Status: DC
  Administered 2021-10-07: 10:00:00 26 [IU] via SUBCUTANEOUS
  Filled 2021-10-07 (×2): qty 0.26

## 2021-10-07 MED ORDER — PROSOURCE TF PO LIQD
45.0000 mL | Freq: Two times a day (BID) | ORAL | Status: DC
  Administered 2021-10-07: 11:00:00 45 mL
  Filled 2021-10-07: qty 45

## 2021-10-07 MED ORDER — INSULIN ASPART 100 UNIT/ML IJ SOLN
3.0000 [IU] | INTRAMUSCULAR | Status: DC
Start: 2021-10-07 — End: 2021-10-09
  Administered 2021-10-07 – 2021-10-08 (×8): 9 [IU] via SUBCUTANEOUS

## 2021-10-07 MED ORDER — POTASSIUM PHOSPHATES 15 MMOLE/5ML IV SOLN
30.0000 mmol | Freq: Once | INTRAVENOUS | Status: AC
  Administered 2021-10-07: 09:00:00 30 mmol via INTRAVENOUS
  Filled 2021-10-07: qty 10

## 2021-10-07 MED ORDER — ADULT MULTIVITAMIN W/MINERALS CH
1.0000 | ORAL_TABLET | Freq: Every day | ORAL | Status: DC
  Administered 2021-10-07 – 2021-10-14 (×8): 1
  Filled 2021-10-07 (×8): qty 1

## 2021-10-07 MED ORDER — FUROSEMIDE 10 MG/ML IJ SOLN
80.0000 mg | Freq: Two times a day (BID) | INTRAMUSCULAR | Status: DC
  Administered 2021-10-07: 16:00:00 80 mg via INTRAVENOUS
  Filled 2021-10-07: qty 8

## 2021-10-07 MED ORDER — VITAL HIGH PROTEIN PO LIQD
1000.0000 mL | ORAL | Status: DC
  Administered 2021-10-07: 11:00:00 1000 mL

## 2021-10-07 MED ORDER — INSULIN DETEMIR 100 UNIT/ML ~~LOC~~ SOLN
20.0000 [IU] | Freq: Two times a day (BID) | SUBCUTANEOUS | Status: DC
  Administered 2021-10-07: 22:00:00 20 [IU] via SUBCUTANEOUS
  Filled 2021-10-07 (×3): qty 0.2

## 2021-10-07 MED ORDER — ALBUTEROL SULFATE (2.5 MG/3ML) 0.083% IN NEBU
INHALATION_SOLUTION | RESPIRATORY_TRACT | Status: AC
  Administered 2021-10-07: 12:00:00 2.5 mg via RESPIRATORY_TRACT
  Filled 2021-10-07: qty 3

## 2021-10-07 MED ORDER — ALBUTEROL SULFATE (2.5 MG/3ML) 0.083% IN NEBU
2.5000 mg | INHALATION_SOLUTION | RESPIRATORY_TRACT | Status: DC | PRN
  Administered 2021-10-07 – 2021-10-08 (×4): 2.5 mg via RESPIRATORY_TRACT
  Filled 2021-10-07 (×3): qty 3

## 2021-10-07 MED ORDER — IPRATROPIUM-ALBUTEROL 0.5-2.5 (3) MG/3ML IN SOLN
3.0000 mL | RESPIRATORY_TRACT | Status: DC
  Administered 2021-10-07 – 2021-10-08 (×9): 3 mL via RESPIRATORY_TRACT
  Filled 2021-10-07 (×9): qty 3

## 2021-10-07 NOTE — Progress Notes (Signed)
NAME:  Sara Chan, MRN:  [id number], DOB:  28-Jun-1971, LOS: 1 ADMISSION DATE:  10/06/2021, CONSULTATION DATE:  10/06/2021 REFERRING MD:  EDP, CHIEF COMPLAINT:  AHRF, Hypertensive emergency   History of Present Illness:  50 year old female who is known to have flu approximately 2 months ago and has never returned to her normal pulmonary baseline.  Patient has been experiencing increasing shortness of breath of the last 2 months without fevers chills or sweats.  Presnted to ED. Initially treated with steroids and bronchodilators.  Was going to have CT Chest for further evaluation but became more hypoxemic and hypercapnic reqiuiring bipap and then intubation after receiving some sedation for scan. An arterial line was placed and her BP was over 300 SBP. Nitro drip started.   Pertinent  Medical History   Past Medical History:  Diagnosis Date   Arthritis    Diabetes mellitus without complication ([place])    Hypertension     Significant Hospital Events: Including procedures, antibiotic start and stop dates in addition to other pertinent events   12/21 presented to the ED with respiratory concerns, initially requiring biPAP but then intubated after receiving sedation for scan; noted to be in hypertensive emergency. PCCM consulted for admission to ICU  Interim History / Subjective:  Overnight, patient noted to be hypotensive for which given 1L bolus This morning, patient awakens to voice and following commands; although delayed. Remains on full vent support.    Objective   Blood pressure (!) 173/82, pulse 96, temperature 98.4 F (36.9 C), temperature source Oral, resp. rate (!) 28, height 5\' 2"  (1.575 m), weight 129.7 kg, SpO2 100 %.    Vent Mode: PRVC FiO2 (%):  [40 %-100 %] 40 % Set Rate:  [20 bmp-30 bmp] 28 bmp Vt Set:  [400 mL] 400 mL PEEP:  [5 cmH20-8 cmH20] 5 cmH20 Plateau Pressure:  [26 cmH20-39 cmH20] 26 cmH20   Intake/Output Summary (Last 24 hours) at 10/07/2021 0748 Last data  filed at 10/07/2021 0700 Gross per 24 hour  Intake 6642.39 ml  Output 3430 ml  Net 3212.39 ml   Filed Weights   10/06/21 0602 10/07/21 0545  Weight: 125.6 kg 129.7 kg    Examination: General: obese middle aged female, intubated, NAD HENT: /AT, EOMI, anicteric sclerae, ETT in place Lungs: diffusely coarse breath sounds; no wheezing or rhonchi, on full vent support Cardiovascular: RRR, S1 and S2 present, no m/r/g Abdomen: soft, nontender, +BS Extremities: warm and dry, +edema Neuro: on fentanyl and propofol gtt; awake and delay in following commands  Resolved Hospital Problem list    Assessment & Plan:  Acute on chronic hypoxemic and hypercarbic respiratory failure  Recent Influenza A respiratory infection with possible CAP Acute pulmonary edema Patient presented with dyspnea and initially treated with steroids and bronchodilaters; however, became hypoxemic and hypercapnic requiring BiPAP and then intubation after receiving sedation. Likely in setting of flash pulmonary edema in setting of hypertensive emergency. Given vancomycin, rocephin and azithromycin for CAP coverage. MRSA neg and procal <0.1, less likely to be bacterial CAP. Continues to appear hypervolemic on exam. Unable to tolerate SBT this AM.  - Continue full vent support, - SBT/SAT as able  - Continue Azithromycin 500mg  x3d - IV Lasix 80mg  bid  - VAP bundle   Hypertensive emergency  Initially noted to have SBP 300 for which started on nitro gtt > cleviprex gtt. Titrated off. Overnight, had episode of hypotension for which given 1L LR. This AM, SBP 140s.  - Continue home losartan, add  amlodipine daily, uptitrate prn   Diabetes mellitus  HbA1c 9.9; pt is on Invokamet and Semglee 40U nightly at home. Significantly hyperglycemic on admission for which started on insulin gtt with improvement. - Transition off insulin gtt with levelmir 20U bid + SSI q4h - May need additional tube feed coverage  - CBG goal  140-180  Hypophosphatemia - Monitor and replete prn  Normocytic anemia  Likely in setting of acute illness. No active bleeding at this time - Trend CBC - Transfuse prn Hb >7  Best Practice (right click and "Reselect all SmartList Selections" daily)   Diet/type: tubefeeds DVT prophylaxis: prophylactic heparin  GI prophylaxis: PPI Lines: N/A Foley:  Yes, and it is still needed Code Status:  full code Last date of multidisciplinary goals of care discussion [family updated 12/21]  Labs   CBC: Recent Labs  Lab 10/06/21 0620 10/06/21 0912 10/06/21 1017 10/06/21 1104 10/06/21 1222 10/06/21 1424 10/07/21 0520  WBC 8.6  --  17.6*  --   --   --  12.4*  NEUTROABS  --   --  15.6*  --   --   --   --   HGB 12.7   < > 11.7* 12.6 12.6 13.3 10.9*  HCT 40.4   < > 37.6 37.0 37.0 39.0 32.6*  MCV 87.3  --  89.3  --   --   --  83.4  PLT 275  --  328  --   --   --  271   < > = values in this interval not displayed.    Basic Metabolic Panel: Recent Labs  Lab 10/06/21 0620 10/06/21 0912 10/06/21 1017 10/06/21 1104 10/06/21 1222 10/06/21 1424 10/06/21 2031 10/07/21 0520  NA 134*   < >  --  140 140 137 138 134*  K 4.1   < >  --  4.5 4.9 4.4 3.2* 3.6  CL 100  --   --   --   --   --  107 105  CO2 23  --   --   --   --   --  22 21*  GLUCOSE 323*  --   --   --   --   --  206* 180*  BUN 9  --   --   --   --   --  11 11  CREATININE 0.70  --  0.80  --   --   --  1.02* 0.96  CALCIUM 8.8*  --   --   --   --   --  8.3* 8.1*  MG  --   --   --   --   --   --  1.6* 2.5*  PHOS  --   --  7.2*  --   --   --   --  <1.0*   < > = values in this interval not displayed.   GFR: Estimated Creatinine Clearance: 88.6 mL/min (by C-G formula based on SCr of 0.96 mg/dL). Recent Labs  Lab 10/06/21 0620 10/06/21 0834 10/06/21 1017 10/06/21 1100 10/07/21 0520  PROCALCITON  --   --  <0.10  --   --   WBC 8.6  --  17.6*  --  12.4*  LATICACIDVEN  --  1.7  --  2.8*  --     Liver Function Tests: Recent  Labs  Lab 10/06/21 0620  AST 28  ALT 21  ALKPHOS 71  BILITOT 1.1  PROT 6.9  ALBUMIN 3.6  No results for input(s): LIPASE, AMYLASE in the last 168 hours. No results for input(s): AMMONIA in the last 168 hours.  ABG    Component Value Date/Time   PHART 7.434 10/07/2021 0447   PCO2ART 33.1 10/07/2021 0447   PO2ART 93.6 10/07/2021 0447   HCO3 21.8 10/07/2021 0447   TCO2 20 (L) 10/06/2021 1424   ACIDBASEDEF 1.8 10/07/2021 0447   O2SAT 98.2 10/07/2021 0447     Coagulation Profile: Recent Labs  Lab 10/06/21 1017  INR 1.0    Cardiac Enzymes: No results for input(s): CKTOTAL, CKMB, CKMBINDEX, TROPONINI in the last 168 hours.  HbA1C: Hgb A1c MFr Bld  Date/Time Value Ref Range Status  10/06/2021 01:16 PM 9.9 (H) 4.8 - 5.6 % Final    Comment:    (NOTE) Pre diabetes:          5.7%-6.4%  Diabetes:              >6.4%  Glycemic control for   <7.0% adults with diabetes     CBG: Recent Labs  Lab 10/07/21 0034 10/07/21 0107 10/07/21 0216 10/07/21 0316 10/07/21 0521  GLUCAP 93 162* 177* 170* 158*    Critical care time:

## 2021-10-07 NOTE — Progress Notes (Signed)
Initial Nutrition Assessment  DOCUMENTATION CODES:   Morbid obesity  INTERVENTION:   Initiate tube feeding via OG tube: Vital HP at 55 ml/h (1320 ml per day) Prosource TF 45 ml once daily  Provides 1360 kcal (2057 kcal total with propofol), 126 gm protein, 1104 ml free water daily.  MVI with minerals daily via tube.   NUTRITION DIAGNOSIS:   Inadequate oral intake related to inability to eat as evidenced by NPO status.  GOAL:   Patient will meet greater than or equal to 90% of their needs  MONITOR:   Vent status, TF tolerance, Labs  REASON FOR ASSESSMENT:   Ventilator, Consult Enteral/tube feeding initiation and management  ASSESSMENT:   50 yo female admitted with acute respiratory failure, hypertensive emergency. Intubated on admission. PMH includes flu 2 months PTA, arthritis, DM, HTN.  Did not tolerate SBT this morning. Received MD Consult for TF initiation and management. OG tube in place. Tube enters the stomach per chest x-ray.  IV insulin and Endo-tool being discontinued today. Receiving IV potassium phosphate for repletion.  Patient is currently intubated on ventilator support MV: 12 L/min Temp (24hrs), Avg:99.3 F (37.4 C), Min:97.5 F (36.4 C), Max:101.1 F (38.4 C)  Propofol: 26.4 ml/hr providing 697 kcal from lipid.  Labs reviewed. Na 134, Phos < 1, mag 2.5, TG 219 CBG: [phone]  Medications reviewed and include Colace, Lasix, Novolog, Levemir, Protonix, Miralax, Fentanyl, propofol.  Weight history reviewed. No significant weight change from 2 years ago.  I/O +3.5 L since admission  NUTRITION - FOCUSED PHYSICAL EXAM:  Flowsheet Row Most Recent Value  Orbital Region No depletion  Upper Arm Region No depletion  Thoracic and Lumbar Region No depletion  Buccal Region No depletion  Temple Region No depletion  Clavicle Bone Region No depletion  Clavicle and Acromion Bone Region No depletion  Scapular Bone Region Unable to assess   Dorsal Hand No depletion  Patellar Region No depletion  Anterior Thigh Region No depletion  Posterior Calf Region No depletion  Edema (RD Assessment) Mild  Hair Reviewed  Eyes Unable to assess  Mouth Unable to assess  Skin Reviewed  Nails Reviewed       Diet Order:   Diet Order             Diet NPO time specified  Diet effective now                   EDUCATION NEEDS:   Not appropriate for education at this time  Skin:  Skin Assessment: Reviewed RN Assessment  Last BM:  no BM documented  Height:   Ht Readings from Last 1 Encounters:  10/06/21 5\' 2"  (1.575 m)    Weight:   Wt Readings from Last 1 Encounters:  10/07/21 129.7 kg    BMI:  Body mass index is 52.3 kg/m.  Estimated Nutritional Needs:   Kcal:  1800-2000  Protein:  120-130 gm  Fluid:  >/= 2 L    Lucas Mallow, RD, LDN, CNSC Please refer to Amion for contact information.

## 2021-10-07 NOTE — Progress Notes (Addendum)
[facility] Progress Note Patient Name: Sydell Prowell DOB: 22-Jun-1971 MRN: [id number]   Date of Service  10/07/2021  HPI/Events of Note  Notified of tachycardia but noted to be febrile as well. On azithromycin. Cultures 12/21 NGTD  Peak and plateau pressures elevated as well Sedated on propofol and Fentanyl  eICU Interventions  Ordered stat CXR and ABG to be done Elink to be informed once done  Addendum: CXR read as vascular crowding, no infiltrates but may have have layering effusion Still with increased work of breathing and tachycardic  Ordered BNP and if elevated plan to diurese Ordered a one time dose of Versed 2 mg IV     Intervention Category Intermediate Interventions: Other:  Judd Lien 10/07/2021, 11:36 PM

## 2021-10-07 NOTE — Progress Notes (Signed)
[facility] Progress Note Patient Name: Sara Chan DOB: 30-Aug-1971 MRN: [id number]   Date of Service  10/07/2021  HPI/Events of Note  Hypophosphatemia   Hypokalemia - PO4--- < 1.0, K+ = 3.6 and Creatinine = 0.96.  eICU Interventions  Plan: Replace K+ and PO4---. Repeat BMP and Phosphorus at 2 PM.      Intervention Category Major Interventions: Electrolyte abnormality - evaluation and management  Lysle Dingwall 10/07/2021, 6:41 AM

## 2021-10-07 NOTE — Progress Notes (Signed)
Date and time results received: 10/07/21 0627   Test: phosphorus Critical Value: <1.0  Name of Provider Notified: Oletta Darter, MD  Orders Received? Or Actions Taken?:  awaiting orders

## 2021-10-07 NOTE — Progress Notes (Signed)
Pt not tolerating SBT at this time due to hypertension, tachycardia, tachypnea and low minute ventilation. Pt placed back on full vent support, pt tolerating well. RN aware, MD aware, RT will continue to monitor.

## 2021-10-08 ENCOUNTER — Inpatient Hospital Stay (HOSPITAL_COMMUNITY): Payer: BLUE CROSS/BLUE SHIELD

## 2021-10-08 DIAGNOSIS — J4551 Severe persistent asthma with (acute) exacerbation: Secondary | ICD-10-CM

## 2021-10-08 DIAGNOSIS — N183 Chronic kidney disease, stage 3 unspecified: Secondary | ICD-10-CM

## 2021-10-08 DIAGNOSIS — E1122 Type 2 diabetes mellitus with diabetic chronic kidney disease: Secondary | ICD-10-CM

## 2021-10-08 DIAGNOSIS — N179 Acute kidney failure, unspecified: Secondary | ICD-10-CM

## 2021-10-08 DIAGNOSIS — I129 Hypertensive chronic kidney disease with stage 1 through stage 4 chronic kidney disease, or unspecified chronic kidney disease: Secondary | ICD-10-CM

## 2021-10-08 DIAGNOSIS — Z452 Encounter for adjustment and management of vascular access device: Secondary | ICD-10-CM

## 2021-10-08 LAB — POCT I-STAT 7, (LYTES, BLD GAS, ICA,H+H)
Acid-base deficit: 4 mmol/L — ABNORMAL HIGH (ref 0.0–2.0)
Acid-base deficit: 5 mmol/L — ABNORMAL HIGH (ref 0.0–2.0)
Acid-base deficit: 6 mmol/L — ABNORMAL HIGH (ref 0.0–2.0)
Acid-base deficit: 7 mmol/L — ABNORMAL HIGH (ref 0.0–2.0)
Acid-base deficit: 7 mmol/L — ABNORMAL HIGH (ref 0.0–2.0)
Bicarbonate: 19.5 mmol/L — ABNORMAL LOW (ref 20.0–28.0)
Bicarbonate: 20.6 mmol/L (ref 20.0–28.0)
Bicarbonate: 20.7 mmol/L (ref 20.0–28.0)
Bicarbonate: 25 mmol/L (ref 20.0–28.0)
Bicarbonate: 25.1 mmol/L (ref 20.0–28.0)
Calcium, Ion: 1.08 mmol/L — ABNORMAL LOW (ref 1.15–1.40)
Calcium, Ion: 1.11 mmol/L — ABNORMAL LOW (ref 1.15–1.40)
Calcium, Ion: 1.13 mmol/L — ABNORMAL LOW (ref 1.15–1.40)
Calcium, Ion: 1.15 mmol/L (ref 1.15–1.40)
Calcium, Ion: 1.16 mmol/L (ref 1.15–1.40)
HCT: 28 % — ABNORMAL LOW (ref 36.0–46.0)
HCT: 31 % — ABNORMAL LOW (ref 36.0–46.0)
HCT: 32 % — ABNORMAL LOW (ref 36.0–46.0)
HCT: 34 % — ABNORMAL LOW (ref 36.0–46.0)
HCT: 35 % — ABNORMAL LOW (ref 36.0–46.0)
Hemoglobin: 10.5 g/dL — ABNORMAL LOW (ref 12.0–15.0)
Hemoglobin: 10.9 g/dL — ABNORMAL LOW (ref 12.0–15.0)
Hemoglobin: 11.6 g/dL — ABNORMAL LOW (ref 12.0–15.0)
Hemoglobin: 11.9 g/dL — ABNORMAL LOW (ref 12.0–15.0)
Hemoglobin: 9.5 g/dL — ABNORMAL LOW (ref 12.0–15.0)
O2 Saturation: 100 %
O2 Saturation: 96 %
O2 Saturation: 97 %
O2 Saturation: 99 %
O2 Saturation: 99 %
Patient temperature: 100
Patient temperature: 100.1
Patient temperature: 100.2
Patient temperature: 98.6
Patient temperature: 99.8
Potassium: 3.3 mmol/L — ABNORMAL LOW (ref 3.5–5.1)
Potassium: 3.5 mmol/L (ref 3.5–5.1)
Potassium: 3.7 mmol/L (ref 3.5–5.1)
Potassium: 3.7 mmol/L (ref 3.5–5.1)
Potassium: 5.1 mmol/L (ref 3.5–5.1)
Sodium: 135 mmol/L (ref 135–145)
Sodium: 136 mmol/L (ref 135–145)
Sodium: 136 mmol/L (ref 135–145)
Sodium: 137 mmol/L (ref 135–145)
Sodium: 137 mmol/L (ref 135–145)
TCO2: 21 mmol/L — ABNORMAL LOW (ref 22–32)
TCO2: 22 mmol/L (ref 22–32)
TCO2: 22 mmol/L (ref 22–32)
TCO2: 27 mmol/L (ref 22–32)
TCO2: 27 mmol/L (ref 22–32)
pCO2 arterial: 36.7 mmHg (ref 32.0–48.0)
pCO2 arterial: 47.7 mmHg (ref 32.0–48.0)
pCO2 arterial: 49.2 mmHg — ABNORMAL HIGH (ref 32.0–48.0)
pCO2 arterial: 63.8 mmHg — ABNORMAL HIGH (ref 32.0–48.0)
pCO2 arterial: 77.5 mmHg (ref 32.0–48.0)
pH, Arterial: 7.124 — CL (ref 7.350–7.450)
pH, Arterial: 7.206 — ABNORMAL LOW (ref 7.350–7.450)
pH, Arterial: 7.233 — ABNORMAL LOW (ref 7.350–7.450)
pH, Arterial: 7.246 — ABNORMAL LOW (ref 7.350–7.450)
pH, Arterial: 7.336 — ABNORMAL LOW (ref 7.350–7.450)
pO2, Arterial: 104 mmHg (ref 83.0–108.0)
pO2, Arterial: 150 mmHg — ABNORMAL HIGH (ref 83.0–108.0)
pO2, Arterial: 219 mmHg — ABNORMAL HIGH (ref 83.0–108.0)
pO2, Arterial: 497 mmHg — ABNORMAL HIGH (ref 83.0–108.0)
pO2, Arterial: 93 mmHg (ref 83.0–108.0)

## 2021-10-08 LAB — BASIC METABOLIC PANEL
Anion gap: 10 (ref 5–15)
BUN: 19 mg/dL (ref 6–20)
CO2: 23 mmol/L (ref 22–32)
Calcium: 8.1 mg/dL — ABNORMAL LOW (ref 8.9–10.3)
Chloride: 103 mmol/L (ref 98–111)
Creatinine, Ser: 1.84 mg/dL — ABNORMAL HIGH (ref 0.44–1.00)
GFR, Estimated: 33 mL/min — ABNORMAL LOW (ref >60)
Glucose, Bld: 282 mg/dL — ABNORMAL HIGH (ref 70–99)
Potassium: 3.8 mmol/L (ref 3.5–5.1)
Sodium: 136 mmol/L (ref 135–145)

## 2021-10-08 LAB — LACTIC ACID, PLASMA
Lactic Acid, Venous: 4.8 mmol/L (ref 0.5–1.9)
Lactic Acid, Venous: 1.7 mmol/L (ref 0.5–1.9)

## 2021-10-08 LAB — PHOSPHORUS
Phosphorus: 7 mg/dL — ABNORMAL HIGH (ref 2.5–4.6)
Phosphorus: 6.4 mg/dL — ABNORMAL HIGH (ref 2.5–4.6)

## 2021-10-08 LAB — GLUCOSE, CAPILLARY
Glucose-Capillary: 287 mg/dL — ABNORMAL HIGH (ref 70–99)
Glucose-Capillary: 331 mg/dL — ABNORMAL HIGH (ref 70–99)
Glucose-Capillary: 261 mg/dL — ABNORMAL HIGH (ref 70–99)
Glucose-Capillary: 248 mg/dL — ABNORMAL HIGH (ref 70–99)
Glucose-Capillary: 270 mg/dL — ABNORMAL HIGH (ref 70–99)
Glucose-Capillary: 234 mg/dL — ABNORMAL HIGH (ref 70–99)
Glucose-Capillary: 275 mg/dL — ABNORMAL HIGH (ref 70–99)
Glucose-Capillary: 347 mg/dL — ABNORMAL HIGH (ref 70–99)
Glucose-Capillary: 374 mg/dL — ABNORMAL HIGH (ref 70–99)

## 2021-10-08 LAB — CBC
HCT: 36.8 % (ref 36.0–46.0)
Hemoglobin: 11.3 g/dL — ABNORMAL LOW (ref 12.0–15.0)
MCH: 27.4 pg (ref 26.0–34.0)
MCHC: 30.7 g/dL (ref 30.0–36.0)
MCV: 89.1 fL (ref 80.0–100.0)
Platelets: 282 10*3/uL (ref 150–400)
RBC: 4.13 MIL/uL (ref 3.87–5.11)
RDW: 15.9 % — ABNORMAL HIGH (ref 11.5–15.5)
WBC: 15.1 10*3/uL — ABNORMAL HIGH (ref 4.0–10.5)
nRBC: 0 % (ref 0.0–0.2)

## 2021-10-08 LAB — MAGNESIUM
Magnesium: 2.6 mg/dL — ABNORMAL HIGH (ref 1.7–2.4)
Magnesium: 2.8 mg/dL — ABNORMAL HIGH (ref 1.7–2.4)

## 2021-10-08 LAB — BRAIN NATRIURETIC PEPTIDE: B Natriuretic Peptide: 21.2 pg/mL (ref 0.0–100.0)

## 2021-10-08 MED ORDER — MAGNESIUM SULFATE 2 GM/50ML IV SOLN
2.0000 g | Freq: Once | INTRAVENOUS | Status: AC
  Administered 2021-10-08: 16:00:00 2 g via INTRAVENOUS
  Filled 2021-10-08: qty 50

## 2021-10-08 MED ORDER — OXYCODONE HCL 5 MG PO TABS
5.0000 mg | ORAL_TABLET | Freq: Four times a day (QID) | ORAL | Status: DC
  Administered 2021-10-08 – 2021-10-14 (×24): 5 mg
  Filled 2021-10-08 (×24): qty 1

## 2021-10-08 MED ORDER — INSULIN DETEMIR 100 UNIT/ML ~~LOC~~ SOLN
30.0000 [IU] | Freq: Two times a day (BID) | SUBCUTANEOUS | Status: DC
  Administered 2021-10-08 (×2): 30 [IU] via SUBCUTANEOUS
  Filled 2021-10-08 (×3): qty 0.3

## 2021-10-08 MED ORDER — POLYETHYLENE GLYCOL 3350 17 G PO PACK
17.0000 g | PACK | Freq: Two times a day (BID) | ORAL | Status: DC
  Administered 2021-10-08 – 2021-10-09 (×4): 17 g
  Filled 2021-10-08 (×4): qty 1

## 2021-10-08 MED ORDER — ALBUMIN HUMAN 5 % IV SOLN
12.5000 g | Freq: Once | INTRAVENOUS | Status: AC
  Administered 2021-10-08: 06:00:00 12.5 g via INTRAVENOUS
  Filled 2021-10-08: qty 250

## 2021-10-08 MED ORDER — METHYLPREDNISOLONE SODIUM SUCC 125 MG IJ SOLR
40.0000 mg | Freq: Every day | INTRAMUSCULAR | Status: AC
  Administered 2021-10-08 – 2021-10-12 (×5): 40 mg via INTRAVENOUS
  Filled 2021-10-08 (×5): qty 2

## 2021-10-08 MED ORDER — ACETAMINOPHEN 160 MG/5ML PO SOLN
650.0000 mg | ORAL | Status: DC | PRN
  Administered 2021-10-08 – 2021-10-12 (×3): 650 mg
  Filled 2021-10-08 (×3): qty 20.3

## 2021-10-08 MED ORDER — ROCURONIUM BROMIDE 10 MG/ML (PF) SYRINGE
100.0000 mg | PREFILLED_SYRINGE | Freq: Once | INTRAVENOUS | Status: AC
  Administered 2021-10-08: 02:00:00 100 mg via INTRAVENOUS
  Filled 2021-10-08: qty 10

## 2021-10-08 MED ORDER — SODIUM CHLORIDE 0.9% FLUSH
10.0000 mL | Freq: Two times a day (BID) | INTRAVENOUS | Status: DC
  Administered 2021-10-09 – 2021-10-12 (×6): 10 mL
  Administered 2021-10-13: 21:00:00 20 mL
  Administered 2021-10-14 – 2021-10-17 (×7): 10 mL

## 2021-10-08 MED ORDER — ALBUMIN HUMAN 5 % IV SOLN
INTRAVENOUS | Status: AC
  Administered 2021-10-08: 02:00:00 12.5 g
  Filled 2021-10-08: qty 250

## 2021-10-08 MED ORDER — QUETIAPINE FUMARATE 25 MG PO TABS
25.0000 mg | ORAL_TABLET | Freq: Every day | ORAL | Status: DC
Start: 2021-10-08 — End: 2021-10-14
  Administered 2021-10-08 – 2021-10-13 (×6): 25 mg
  Filled 2021-10-08 (×6): qty 1

## 2021-10-08 MED ORDER — SODIUM CHLORIDE 0.9 % IV SOLN
0.5000 mg/kg/h | INTRAVENOUS | Status: DC
  Administered 2021-10-08 (×2): 0.5 mg/kg/h via INTRAVENOUS
  Administered 2021-10-09 (×3): 0.75 mg/kg/h via INTRAVENOUS
  Administered 2021-10-09: 05:00:00 0.5 mg/kg/h via INTRAVENOUS
  Administered 2021-10-10 (×2): 1 mg/kg/h via INTRAVENOUS
  Administered 2021-10-10: 03:00:00 0.75 mg/kg/h via INTRAVENOUS
  Administered 2021-10-10 – 2021-10-13 (×17): 1 mg/kg/h via INTRAVENOUS
  Filled 2021-10-08 (×32): qty 5

## 2021-10-08 MED ORDER — ARTIFICIAL TEARS OPHTHALMIC OINT
1.0000 "application " | TOPICAL_OINTMENT | Freq: Three times a day (TID) | OPHTHALMIC | Status: DC
  Administered 2021-10-08 – 2021-10-10 (×6): 1 via OPHTHALMIC
  Filled 2021-10-08 (×2): qty 3.5

## 2021-10-08 MED ORDER — ALBUMIN HUMAN 5 % IV SOLN
12.5000 g | Freq: Once | INTRAVENOUS | Status: AC
  Administered 2021-10-08: 02:00:00 12.5 g via INTRAVENOUS

## 2021-10-08 MED ORDER — ALBUTEROL (5 MG/ML) CONTINUOUS INHALATION SOLN
20.0000 mg/h | INHALATION_SOLUTION | RESPIRATORY_TRACT | Status: DC
  Filled 2021-10-08: qty 0.5

## 2021-10-08 MED ORDER — ALBUTEROL SULFATE 2.5 MG/0.5ML IN NEBU
20.0000 mg/h | INHALATION_SOLUTION | RESPIRATORY_TRACT | Status: DC
  Filled 2021-10-08 (×3): qty 12

## 2021-10-08 MED ORDER — VITAL HIGH PROTEIN PO LIQD
1000.0000 mL | ORAL | Status: DC
  Administered 2021-10-08 – 2021-10-12 (×4): 1000 mL
  Filled 2021-10-08 (×3): qty 1000

## 2021-10-08 MED ORDER — ROCURONIUM BROMIDE 10 MG/ML (PF) SYRINGE
100.0000 mg | PREFILLED_SYRINGE | Freq: Once | INTRAVENOUS | Status: AC
  Administered 2021-10-08: 06:00:00 100 mg via INTRAVENOUS
  Filled 2021-10-08: qty 10

## 2021-10-08 MED ORDER — KETAMINE BOLUS VIA INFUSION
0.5000 mg/kg | Freq: Once | INTRAVENOUS | Status: AC
  Administered 2021-10-08: 16:00:00 67.55 mg via INTRAVENOUS
  Filled 2021-10-08: qty 70

## 2021-10-08 MED ORDER — METOLAZONE 2.5 MG PO TABS
2.5000 mg | ORAL_TABLET | Freq: Once | ORAL | Status: AC
  Administered 2021-10-08: 11:00:00 2.5 mg
  Filled 2021-10-08: qty 1

## 2021-10-08 MED ORDER — CISATRACURIUM BOLUS VIA INFUSION
2.5000 mg | Freq: Once | INTRAVENOUS | Status: AC
  Administered 2021-10-08: 17:00:00 2.5 mg via INTRAVENOUS
  Filled 2021-10-08: qty 3

## 2021-10-08 MED ORDER — ALBUTEROL (5 MG/ML) CONTINUOUS INHALATION SOLN
10.0000 mg/h | INHALATION_SOLUTION | RESPIRATORY_TRACT | Status: AC
  Administered 2021-10-08 – 2021-10-09 (×6): 20 mg/h via RESPIRATORY_TRACT
  Filled 2021-10-08: qty 8
  Filled 2021-10-08: qty 16
  Filled 2021-10-08 (×2): qty 0.5
  Filled 2021-10-08: qty 17.5
  Filled 2021-10-08: qty 0.5
  Filled 2021-10-08: qty 16
  Filled 2021-10-08: qty 12.5
  Filled 2021-10-08: qty 16

## 2021-10-08 MED ORDER — FUROSEMIDE 10 MG/ML IJ SOLN
80.0000 mg | Freq: Two times a day (BID) | INTRAMUSCULAR | Status: DC
  Administered 2021-10-08 – 2021-10-13 (×10): 80 mg via INTRAVENOUS
  Filled 2021-10-08 (×10): qty 8

## 2021-10-08 MED ORDER — MIDAZOLAM HCL 2 MG/2ML IJ SOLN
2.0000 mg | Freq: Once | INTRAMUSCULAR | Status: AC
  Administered 2021-10-08: 01:00:00 2 mg via INTRAVENOUS
  Filled 2021-10-08: qty 2

## 2021-10-08 MED ORDER — CISATRACURIUM BESYLATE (PF) 200 MG/20ML IV SOLN
0.0000 ug/kg/min | INTRAVENOUS | Status: DC
  Administered 2021-10-08: 17:00:00 3 ug/kg/min via INTRAVENOUS
  Administered 2021-10-09: 14:00:00 1 ug/kg/min via INTRAVENOUS
  Filled 2021-10-08 (×5): qty 20

## 2021-10-08 MED ORDER — INSULIN ASPART 100 UNIT/ML IJ SOLN
8.0000 [IU] | INTRAMUSCULAR | Status: DC
  Administered 2021-10-08 (×4): 8 [IU] via SUBCUTANEOUS

## 2021-10-08 MED ORDER — INSULIN ASPART 100 UNIT/ML IJ SOLN
4.0000 [IU] | INTRAMUSCULAR | Status: DC
  Administered 2021-10-08: 04:00:00 4 [IU] via SUBCUTANEOUS

## 2021-10-08 MED ORDER — ALBUTEROL SULFATE (2.5 MG/3ML) 0.083% IN NEBU
2.5000 mg | INHALATION_SOLUTION | RESPIRATORY_TRACT | Status: DC | PRN
  Administered 2021-10-08 – 2021-10-15 (×5): 2.5 mg via RESPIRATORY_TRACT
  Filled 2021-10-08 (×7): qty 3

## 2021-10-08 MED ORDER — SODIUM CHLORIDE 0.9% FLUSH: 10.0000 mL | INTRAVENOUS | Status: DC | PRN

## 2021-10-08 MED ORDER — POTASSIUM CHLORIDE 20 MEQ PO PACK
40.0000 meq | PACK | Freq: Once | ORAL | Status: AC
  Administered 2021-10-08: 11:00:00 40 meq
  Filled 2021-10-08: qty 2

## 2021-10-08 MED ORDER — NOREPINEPHRINE 4 MG/250ML-% IV SOLN
0.0000 ug/min | INTRAVENOUS | Status: DC
  Administered 2021-10-08: 17:00:00 2 ug/min via INTRAVENOUS
  Filled 2021-10-08 (×2): qty 250

## 2021-10-08 MED ORDER — LACTATED RINGERS IV BOLUS: 500.0000 mL | Freq: Once | INTRAVENOUS | Status: DC

## 2021-10-08 NOTE — Progress Notes (Signed)
NAME:  Sara Chan, MRN:  [id number], DOB:  [birth year], LOS: 2 ADMISSION DATE:  10/06/2021, CONSULTATION DATE:  10/06/2021 REFERRING MD:  EDP, CHIEF COMPLAINT:  AHRF, Hypertensive emergency   History of Present Illness:  50 year old female who is known to have flu approximately 2 months ago and has never returned to her normal pulmonary baseline.  Patient has been experiencing increasing shortness of breath of the last 2 months without fevers chills or sweats.  Presnted to ED. Initially treated with steroids and bronchodilators.  Was going to have CT Chest for further evaluation but became more hypoxemic and hypercapnic reqiuiring bipap and then intubation after receiving some sedation for scan. An arterial line was placed and her BP was over 300 SBP. Nitro drip started.   Pertinent  Medical History   Past Medical History:  Diagnosis Date   Arthritis    Diabetes mellitus without complication ([place])    Hypertension     Significant Hospital Events: Including procedures, antibiotic start and stop dates in addition to other pertinent events   12/21 presented to the ED with respiratory concerns, initially requiring biPAP but then intubated after receiving sedation for scan; noted to be in hypertensive emergency. PCCM consulted for admission to ICU  Interim History / Subjective:  Overnight hypotensive and given more fluids/albumin. Given rocuronium for ventilator dyssynchrony  Objective   Blood pressure (!) 103/59, pulse (!) 147, temperature 100.1 F (37.8 C), temperature source Axillary, resp. rate (!) 32, height 5\' 2"  (1.575 m), weight 135.1 kg, SpO2 95 %.    Vent Mode: PRVC FiO2 (%):  [40 %] 40 % Set Rate:  [26 bmp-32 bmp] 26 bmp Vt Set:  [400 mL] 400 mL PEEP:  [5 cmH20] 5 cmH20 Plateau Pressure:  [25 WGN56-21 cmH20] 37 cmH20   Intake/Output Summary (Last 24 hours) at 10/08/2021 0954 Last data filed at 10/07/2021 1800 Gross per 24 hour  Intake 1577.48 ml  Output --  Net 1577.48  ml   Filed Weights   10/06/21 0602 10/07/21 0545 10/08/21 0500  Weight: 125.6 kg 129.7 kg 135.1 kg    Examination: Gen:      Intubated, sedated, acutely ill appearing HEENT:  ETT to vent Lungs:    sounds of mechanical ventilation auscultated mild expiratory wheezing CV:         tachycardic, regular Abd:      + bowel sounds; soft, non-tender; no palpable masses, no distension Ext:    No edema Skin:      Warm and dry; no rashes Neuro:   sedated, RASS -2, moves all 4 extremities   Resolved Hospital Problem list   hypophosphatemia Assessment & Plan:  Acute on chronic hypoxemic and hypercarbic respiratory failure  Recent Influenza A respiratory infection  Acute pulmonary edema - Continue full vent support.  - SBT/SAT as able  - Continue Azithromycin 500mg  x3d. PCT low.  - IV Lasix 80mg  bid. Will add metolazone today. Would prefer adding pressors for hypotension overnight rather than adding more fluids  - VAP bundle  - her peak pressures will be elevated due to her decreased chest wall compliance. Prefer to titrate vent settings to driving pressures. Increasing PEEP to 9 today. ABG reviewed with respiratory alkalosis. Decrease RR from 32 to 26. Repeat ABG.   Hypertensive emergency  Initially noted to have SBP 300 for which started on nitro gtt > cleviprex gtt. Titrated off.  Now with intermittenet hypotension and getting fluids overnight for this.  - stop losartan.  - goal is  to diurese patient and she is unfortunately 5L positive since admission.  - continue lasix.  AKI Hypokalemia - likely secondary to pre-renal azotemia and ARB use - hold arb - low dose replacement of potassium given diuresis - lasix as above - monitor BMET  Diabetes mellitus  HbA1c 9.9; pt is on Invokamet and Semglee 40U nightly at home. Significantly hyperglycemic on admission for which started on insulin gtt with improvement. - increase to levemir 30 units BID and increase tube feed coverage. May also  need to increase sliding scale - CBG goal 140-180  Normocytic anemia  Likely in setting of acute illness. No active bleeding at this time - Trend CBC - Transfuse prn Hb >7  Acute metabolic encephalopathy - sedation for mechanical ventilation - start seroquel   Best Practice (right click and "Reselect all SmartList Selections" daily)   Diet/type: tubefeeds DVT prophylaxis: prophylactic heparin  GI prophylaxis: PPI Lines: N/A Foley:  Yes, and it is still needed Code Status:  full code Last date of multidisciplinary goals of care discussion [husband updated at the bedside 12/23]  Labs   CBC: Recent Labs  Lab 10/06/21 0620 10/06/21 0912 10/06/21 1017 10/06/21 1104 10/07/21 0520 10/08/21 0015 10/08/21 0120 10/08/21 0419 10/08/21 0844  WBC 8.6  --  17.6*  --  12.4*  --  15.1*  --   --   NEUTROABS  --   --  15.6*  --   --   --   --   --   --   HGB 12.7   < > 11.7*   < > 10.9* 11.9* 11.3* 10.9* 9.5*  HCT 40.4   < > 37.6   < > 32.6* 35.0* 36.8 32.0* 28.0*  MCV 87.3  --  89.3  --  83.4  --  89.1  --   --   PLT 275  --  328  --  271  --  282  --   --    < > = values in this interval not displayed.    Basic Metabolic Panel: Recent Labs  Lab 10/06/21 0620 10/06/21 0912 10/06/21 1017 10/06/21 1104 10/06/21 2031 10/07/21 0520 10/07/21 1209 10/07/21 1650 10/08/21 0015 10/08/21 0120 10/08/21 0419 10/08/21 0844  NA 134*   < >  --    < > 138 134* 138  --  137 136 136 136  K 4.1   < >  --    < > 3.2* 3.6 3.6  --  3.7 3.8 3.5 3.3*  CL 100  --   --   --  107 105 106  --   --  103  --   --   CO2 23  --   --   --  22 21* 22  --   --  23  --   --   GLUCOSE 323*  --   --   --  206* 180* 174*  --   --  282*  --   --   BUN 9  --   --   --  11 11 12   --   --  19  --   --   CREATININE 0.70  --  0.80  --  1.02* 0.96 1.02*  --   --  1.84*  --   --   CALCIUM 8.8*  --   --   --  8.3* 8.1* 8.2*  --   --  8.1*  --   --   MG  --   --   --   --  1.6* 2.5* 2.1 2.3  --  2.6*  --   --    PHOS  --   --  7.2*  --   --  <1.0* 3.2 4.3  --  7.0*  --   --    < > = values in this interval not displayed.   GFR: Estimated Creatinine Clearance: 47.5 mL/min (A) (by C-G formula based on SCr of 1.84 mg/dL (H)). Recent Labs  Lab 10/06/21 0620 10/06/21 0834 10/06/21 1017 10/06/21 1100 10/07/21 0520 10/08/21 0120 10/08/21 0320  PROCALCITON  --   --  <0.10  --   --   --   --   WBC 8.6  --  17.6*  --  12.4* 15.1*  --   LATICACIDVEN  --  1.7  --  2.8*  --   --  4.8*    Liver Function Tests: Recent Labs  Lab 10/06/21 0620  AST 28  ALT 21  ALKPHOS 71  BILITOT 1.1  PROT 6.9  ALBUMIN 3.6   No results for input(s): LIPASE, AMYLASE in the last 168 hours. No results for input(s): AMMONIA in the last 168 hours.  ABG    Component Value Date/Time   PHART 7.336 (L) 10/08/2021 0844   PCO2ART 36.7 10/08/2021 0844   PO2ART 93 10/08/2021 0844   HCO3 19.5 (L) 10/08/2021 0844   TCO2 21 (L) 10/08/2021 0844   ACIDBASEDEF 6.0 (H) 10/08/2021 0844   O2SAT 96.0 10/08/2021 0844     Coagulation Profile: Recent Labs  Lab 10/06/21 1017  INR 1.0    Cardiac Enzymes: No results for input(s): CKTOTAL, CKMB, CKMBINDEX, TROPONINI in the last 168 hours.  HbA1C: Hgb A1c MFr Bld  Date/Time Value Ref Range Status  10/06/2021 01:16 PM 9.9 (H) 4.8 - 5.6 % Final    Comment:    (NOTE) Pre diabetes:          5.7%-6.4%  Diabetes:              >6.4%  Glycemic control for   <7.0% adults with diabetes     CBG: Recent Labs  Lab 10/07/21 1513 10/07/21 1918 10/07/21 2316 10/08/21 0310 10/08/21 0722  GLUCAP 234* 224* 248* 287* 331*    Critical care time: 65minutes    The patient is critically ill due to respiratory failure.  Critical care was necessary to treat or prevent imminent or life-threatening deterioration.  Critical care was time spent personally by me on the following activities: development of treatment plan with patient and/or surrogate as well as nursing, discussions  with consultants, evaluation of patient's response to treatment, examination of patient, obtaining history from patient or surrogate, ordering and performing treatments and interventions, ordering and review of laboratory studies, ordering and review of radiographic studies, pulse oximetry, re-evaluation of patient's condition and participation in multidisciplinary rounds.   Critical Care Time devoted to patient care services described in this note is 40 minutes. This time reflects time of care of this Short . This critical care time does not reflect separately billable procedures or procedure time, teaching time or supervisory time of PA/NP/Med student/Med Resident etc but could involve care discussion time.       Spero Geralds Jessie Pulmonary and Critical Care Medicine 10/08/2021 9:54 AM  Pager: see AMION  If no response to pager , please call critical care on call (see AMION) until 7pm After 7:00 pm call Elink

## 2021-10-08 NOTE — Progress Notes (Signed)
Progress note:  Called to bedside for worsening hypotension, high peak pressures. Concern for autopeep and bronchospasm. Patient manually decompressed at the bedside for auto-peep. On my arrival she was on pressure control with minute ventilation 5-6. This is not adequate ventilation for her CO2 clearance.   Start continuous nebulizer treatments. Start solumedrol. Start magnesium infusion. Will deeply sedate and start continuous paralytics. She will need a central line.   I updated the patients husband and sister at bedside on plan of care.   Additional CC time 45 minutes.   Sara Llamas, MD Pulmonary and [place] 10/08/2021 1:44 PM Pager: see AMION  If no response to pager, please call critical care on call (see AMION) until 7pm After 7:00 pm call Elink

## 2021-10-08 NOTE — Progress Notes (Signed)
CAT complete, RT to refill med not loaded in pyxis. Pharm notified and meds will be sent up within 71mins. RT will continue to monitor.

## 2021-10-08 NOTE — Progress Notes (Signed)
eLink Physician-Brief Progress Note Patient Name: Sara Chan DOB: 1971-07-13 MRN: [id number]   Date of Service  10/08/2021  HPI/Events of Note  Desynchronized on the vent again BP responded well will the albumin infusion HR remained elevated 140s  eICU Interventions  Ordered another albumin 5% Ordered Rocuronium x 1     Intervention Category Intermediate Interventions: Other:  Judd Lien 10/08/2021, 5:23 AM

## 2021-10-08 NOTE — Procedures (Signed)
Central Venous Catheter Insertion Procedure Note  Sara Chan  [id number]  12-08-1970  Date:10/08/21  Time:2:48 PM   Provider Performing:Chasty Randal Mauricio Po   Procedure: Insertion of Non-tunneled Central Venous [phone]) with US guidance (76160)   Indication(s) Medication administration  Consent Risks of the procedure as well as the alternatives and risks of each were explained to the patient and/or caregiver.  Consent for the procedure was obtained and is signed in the bedside chart  Anesthesia Topical only with 1% lidocaine   Timeout Verified patient identification, verified procedure, site/side was marked, verified correct patient position, special equipment/implants available, medications/allergies/relevant history reviewed, required imaging and test results available.  Sterile Technique Maximal sterile technique including full sterile barrier drape, hand hygiene, sterile gown, sterile gloves, mask, hair covering, sterile ultrasound probe cover (if used).  Procedure Description Area of catheter insertion was cleaned with chlorhexidine and draped in sterile fashion.  With real-time ultrasound guidance a central venous catheter was placed into the right internal jugular vein. Nonpulsatile blood flow and easy flushing noted in all ports.  The catheter was sutured in place and sterile dressing applied.  Complications/Tolerance None; patient tolerated the procedure well. Chest X-ray is ordered to verify placement for internal jugular or subclavian cannulation.   Chest x-ray is not ordered for femoral cannulation.  EBL Minimal  Specimen(s) None  Lenice Llamas, MD Pulmonary and [place] 10/08/2021 2:49 PM Pager: see AMION  If no response to pager, please call critical care on call (see AMION) until 7pm After 7:00 pm call Elink

## 2021-10-08 NOTE — Progress Notes (Addendum)
Patient was tachycardic throughout the night with heart rate ranging from 120-150. Patient spiked temperature of 100.2 at 2250 and 99.4 at 2230. Elink was notified  at 2345  of patient temperature and tachycardia. Patient also began to become dyssynchronous with ventilator. Versed order was given by E link and given at 0041. Respiratory therapist Octavia Bruckner was consulted and shortly after Dr. Marvel Plan was consulted to witness patient moderate to severe dyssynchrony. Dr. Celesta Aver gave verbal and written order for Rocuronium that was administered per order at 0132. Patient was also given albumin by E link for low blood pressure. Rocuronium was ordered again per E Link  (Dr. Bedelia Person) as a written order and administered at 0534 due to increasing dyssynchrony as well as albumin given at 0542. Foley was ordered verbally by Dr. Marvel Plan due to concern for decreasing renal function. Foley was inserted. Original A line was removed and new A line replaced by RT. No further concerns.

## 2021-10-08 NOTE — Procedures (Signed)
Arterial Catheter Insertion Procedure Note  Sara Chan  [id number]  Mar 22, 1971  Date:10/08/21  Time:3:16 AM    Provider Performing: Ulice Dash    Procedure: Insertion of Arterial Line [phone]) with US guidance ([id number])   Indication(s) Blood pressure monitoring and/or need for frequent ABGs  Consent Unable to obtain consent due to emergent nature of procedure.  Anesthesia None   Time Out Verified patient identification, verified procedure, site/side was marked, verified correct patient position, special equipment/implants available, medications/allergies/relevant history reviewed, required imaging and test results available.   Sterile Technique Maximal sterile technique including full sterile barrier drape, hand hygiene, sterile gown, sterile gloves, mask, hair covering, sterile ultrasound probe cover (if used).   Procedure Description Area of catheter insertion was cleaned with chlorhexidine and draped in sterile fashion. With real-time ultrasound guidance an arterial catheter was placed into the left radial artery.  Appropriate arterial tracings confirmed on monitor.     Complications/Tolerance None; patient tolerated the procedure well.   EBL Minimal   Specimen(s) None

## 2021-10-08 NOTE — Progress Notes (Signed)
CCM event note  Notified by RT that patient has been asynchronous with the ventilator. Patient received 2 mg of Versed prior to my exam.  Gen:       Intubated, sedated, acutely ill appearing HEENT:  AT/[place], pupils symmetrical  Lungs:    poor air entry bilateral  CV:         sinus tachycardic, regular Abd:        soft, obese, not distension Ext:         trace edema Skin:      Warm and dry; no rashes Neuro:   sedated, RASS -4  Intervention: Acute hypoxic and hypercarbic respiratory failure with ventilator asynchrony on Fentanyl, Propofol drips and Versed push. No high airway pressures. Plan: trial of paralytic with Rocuronium.

## 2021-10-08 NOTE — Progress Notes (Signed)
[facility] Progress Note Patient Name: Sara Chan DOB: 12-20-1970 MRN: [id number]   Date of Service  10/08/2021  HPI/Events of Note  CBGs trending up, last was 275. Endotool advising to restart insulin drip. However, passed to night RN attempting to avoid restarting drip by increasing Levemir to 30 units. On solumedrol and TF @ 55 and resistant SSI.  eICU Interventions  Will monitor while on this recently increased dose of Levemir with second dose to be given tonight. Also on fixed dose insulin 8 units and SSI to give the > 250 glucose coverage Discussed with bedside RN     Intervention Category Intermediate Interventions: Hyperglycemia - evaluation and treatment  Shona Needles Vincenza Dail 10/08/2021, 9:00 PM

## 2021-10-08 NOTE — Progress Notes (Signed)
eLink Physician-Brief Progress Note Patient Name: Sara Chan DOB: 1971/09/25 MRN: [id number]   Date of Service  10/08/2021  HPI/Events of Note  Called to camera in due to tachycardia in the 150s, BP 78/44, Temp 100 Noted patient was given Rocuronium for vent desynchrony despite sedation including Versed push Foley inserted Sinus tachycardia on EKG  eICU Interventions  Will give a trial of albumin infusion 5% Bedside CCM team informed     Intervention Category Intermediate Interventions: Arrhythmia - evaluation and management;Hypotension - evaluation and management  Judd Lien 10/08/2021, 1:54 AM

## 2021-10-09 LAB — POCT I-STAT 7, (LYTES, BLD GAS, ICA,H+H)
Acid-base deficit: 6 mmol/L — ABNORMAL HIGH (ref 0.0–2.0)
Acid-base deficit: 4 mmol/L — ABNORMAL HIGH (ref 0.0–2.0)
Bicarbonate: 20.3 mmol/L (ref 20.0–28.0)
Bicarbonate: 22.5 mmol/L (ref 20.0–28.0)
Calcium, Ion: 1.11 mmol/L — ABNORMAL LOW (ref 1.15–1.40)
Calcium, Ion: 1.11 mmol/L — ABNORMAL LOW (ref 1.15–1.40)
HCT: 29 % — ABNORMAL LOW (ref 36.0–46.0)
HCT: 29 % — ABNORMAL LOW (ref 36.0–46.0)
Hemoglobin: 9.9 g/dL — ABNORMAL LOW (ref 12.0–15.0)
Hemoglobin: 9.9 g/dL — ABNORMAL LOW (ref 12.0–15.0)
O2 Saturation: 97 %
O2 Saturation: 97 %
Patient temperature: 98.6
Patient temperature: 98.4
Potassium: 3.3 mmol/L — ABNORMAL LOW (ref 3.5–5.1)
Potassium: 3.4 mmol/L — ABNORMAL LOW (ref 3.5–5.1)
Sodium: 135 mmol/L (ref 135–145)
Sodium: 136 mmol/L (ref 135–145)
TCO2: 22 mmol/L (ref 22–32)
TCO2: 24 mmol/L (ref 22–32)
pCO2 arterial: 42.6 mmHg (ref 32.0–48.0)
pCO2 arterial: 44.3 mmHg (ref 32.0–48.0)
pH, Arterial: 7.287 — ABNORMAL LOW (ref 7.350–7.450)
pH, Arterial: 7.312 — ABNORMAL LOW (ref 7.350–7.450)
pO2, Arterial: 100 mmHg (ref 83.0–108.0)
pO2, Arterial: 103 mmHg (ref 83.0–108.0)

## 2021-10-09 LAB — GLUCOSE, CAPILLARY
Glucose-Capillary: 407 mg/dL — ABNORMAL HIGH (ref 70–99)
Glucose-Capillary: 376 mg/dL — ABNORMAL HIGH (ref 70–99)
Glucose-Capillary: 349 mg/dL — ABNORMAL HIGH (ref 70–99)
Glucose-Capillary: 301 mg/dL — ABNORMAL HIGH (ref 70–99)
Glucose-Capillary: 258 mg/dL — ABNORMAL HIGH (ref 70–99)
Glucose-Capillary: 276 mg/dL — ABNORMAL HIGH (ref 70–99)
Glucose-Capillary: 299 mg/dL — ABNORMAL HIGH (ref 70–99)
Glucose-Capillary: 252 mg/dL — ABNORMAL HIGH (ref 70–99)
Glucose-Capillary: 272 mg/dL — ABNORMAL HIGH (ref 70–99)
Glucose-Capillary: 194 mg/dL — ABNORMAL HIGH (ref 70–99)
Glucose-Capillary: 212 mg/dL — ABNORMAL HIGH (ref 70–99)
Glucose-Capillary: 197 mg/dL — ABNORMAL HIGH (ref 70–99)
Glucose-Capillary: 207 mg/dL — ABNORMAL HIGH (ref 70–99)
Glucose-Capillary: 176 mg/dL — ABNORMAL HIGH (ref 70–99)
Glucose-Capillary: 151 mg/dL — ABNORMAL HIGH (ref 70–99)
Glucose-Capillary: 172 mg/dL — ABNORMAL HIGH (ref 70–99)
Glucose-Capillary: 166 mg/dL — ABNORMAL HIGH (ref 70–99)
Glucose-Capillary: 169 mg/dL — ABNORMAL HIGH (ref 70–99)
Glucose-Capillary: 197 mg/dL — ABNORMAL HIGH (ref 70–99)
Glucose-Capillary: 212 mg/dL — ABNORMAL HIGH (ref 70–99)
Glucose-Capillary: 212 mg/dL — ABNORMAL HIGH (ref 70–99)
Glucose-Capillary: 219 mg/dL — ABNORMAL HIGH (ref 70–99)
Glucose-Capillary: 186 mg/dL — ABNORMAL HIGH (ref 70–99)
Glucose-Capillary: 188 mg/dL — ABNORMAL HIGH (ref 70–99)

## 2021-10-09 LAB — BASIC METABOLIC PANEL WITH GFR
Anion gap: 12 (ref 5–15)
BUN: 39 mg/dL — ABNORMAL HIGH (ref 6–20)
CO2: 19 mmol/L — ABNORMAL LOW (ref 22–32)
Calcium: 7.7 mg/dL — ABNORMAL LOW (ref 8.9–10.3)
Chloride: 102 mmol/L (ref 98–111)
Creatinine, Ser: 2.2 mg/dL — ABNORMAL HIGH (ref 0.44–1.00)
GFR, Estimated: 26 mL/min — ABNORMAL LOW
Glucose, Bld: 291 mg/dL — ABNORMAL HIGH (ref 70–99)
Potassium: 3.1 mmol/L — ABNORMAL LOW (ref 3.5–5.1)
Sodium: 133 mmol/L — ABNORMAL LOW (ref 135–145)

## 2021-10-09 LAB — CBC
HCT: 30.1 % — ABNORMAL LOW (ref 36.0–46.0)
Hemoglobin: 9.6 g/dL — ABNORMAL LOW (ref 12.0–15.0)
MCH: 27.8 pg (ref 26.0–34.0)
MCHC: 31.9 g/dL (ref 30.0–36.0)
MCV: 87.2 fL (ref 80.0–100.0)
Platelets: 225 K/uL (ref 150–400)
RBC: 3.45 MIL/uL — ABNORMAL LOW (ref 3.87–5.11)
RDW: 15.9 % — ABNORMAL HIGH (ref 11.5–15.5)
WBC: 11.2 K/uL — ABNORMAL HIGH (ref 4.0–10.5)
nRBC: 0 % (ref 0.0–0.2)

## 2021-10-09 LAB — TRIGLYCERIDES: Triglycerides: 335 mg/dL — ABNORMAL HIGH (ref <150)

## 2021-10-09 MED ORDER — INSULIN REGULAR(HUMAN) IN NACL 100-0.9 UT/100ML-% IV SOLN
INTRAVENOUS | Status: DC
  Administered 2021-10-09: 18:00:00 9.5 [IU]/h via INTRAVENOUS
  Administered 2021-10-09: 02:00:00 13 [IU]/h via INTRAVENOUS
  Administered 2021-10-09: 08:00:00 17 [IU]/h via INTRAVENOUS
  Administered 2021-10-10: 02:00:00 6.5 [IU]/h via INTRAVENOUS
  Administered 2021-10-11: 2.6 [IU]/h via INTRAVENOUS
  Filled 2021-10-09: qty 100
  Filled 2021-10-09 (×2): qty 200
  Filled 2021-10-09: qty 100

## 2021-10-09 MED ORDER — POTASSIUM CHLORIDE 20 MEQ PO PACK
40.0000 meq | PACK | ORAL | Status: AC
  Administered 2021-10-09 (×2): 40 meq
  Filled 2021-10-09 (×2): qty 2

## 2021-10-09 MED ORDER — ALBUTEROL SULFATE (2.5 MG/3ML) 0.083% IN NEBU
5.0000 mg | INHALATION_SOLUTION | RESPIRATORY_TRACT | Status: DC
  Administered 2021-10-09 – 2021-10-12 (×17): 5 mg via RESPIRATORY_TRACT
  Filled 2021-10-09 (×17): qty 6

## 2021-10-09 MED ORDER — METOLAZONE 2.5 MG PO TABS
2.5000 mg | ORAL_TABLET | Freq: Once | ORAL | Status: AC
  Administered 2021-10-09: 12:00:00 2.5 mg
  Filled 2021-10-09: qty 1

## 2021-10-09 NOTE — Progress Notes (Addendum)
E link MD  was notified at 2050 12/23 of patient blood sugar of 275. Dr. Genevive Bi gave permission to adminster 9 units sliding scale in addition to 8 units already ordered. At [place] 12/24 Dr. Bedelia Person was notified of patient Train of four at 0. Dr. Genevive Bi gave verbal order to turn off Nimbex. In addition, Patient BIS score was above 60 and per order Dr. Bedelia Person was contacted and ordered change of rate from 0.50 to 0.75 as well. No further concerns.

## 2021-10-09 NOTE — Progress Notes (Addendum)
NAME:  Sara Chan, MRN:  [id number], DOB:  1971-02-25, LOS: 3 ADMISSION DATE:  10/06/2021, CONSULTATION DATE:  10/06/2021 REFERRING MD:  EDP, CHIEF COMPLAINT:  AHRF, Hypertensive emergency   History of Present Illness:  50 year old female who is known to have flu approximately 2 months ago and has never returned to her normal pulmonary baseline.  Patient has been experiencing increasing shortness of breath of the last 2 months without fevers chills or sweats.  Presnted to ED. Initially treated with steroids and bronchodilators.  Was going to have CT Chest for further evaluation but became more hypoxemic and hypercapnic reqiuiring bipap and then intubation after receiving some sedation for scan. An arterial line was placed and her BP was over 300 SBP. Nitro drip started.   Pertinent  Medical History   Past Medical History:  Diagnosis Date   Arthritis    Diabetes mellitus without complication ([place])    Hypertension     Significant Hospital Events: Including procedures, antibiotic start and stop dates in addition to other pertinent events   12/21 presented to the ED with respiratory concerns, initially requiring biPAP but then intubated after receiving sedation for scan; noted to be in hypertensive emergency. PCCM consulted for admission to ICU  Interim History / Subjective:  Maintained deep sedation with paralytics. Started on insulin Gtt  Objective   Blood pressure 130/66, pulse (!) 133, temperature 98.4 F (36.9 C), temperature source Oral, resp. rate (!) 26, height 5\' 2"  (1.575 m), weight 132 kg, SpO2 97 %.    Vent Mode: PRVC FiO2 (%):  [40 %-60 %] 40 % Set Rate:  [26 bmp] 26 bmp Vt Set:  [400 mL] 400 mL PEEP:  [5 cmH20-8 cmH20] 5 cmH20 Plateau Pressure:  [28 cmH20-44 cmH20] 28 cmH20   Intake/Output Summary (Last 24 hours) at 10/09/2021 1032 Last data filed at 10/09/2021 0800 Gross per 24 hour  Intake 2722.08 ml  Output 2160 ml  Net 562.08 ml   Filed Weights   10/07/21  0545 10/08/21 0500 10/09/21 0500  Weight: 129.7 kg 135.1 kg 132 kg    Examination: Gen:      Intubated, sedated, acutely ill appearing HEENT:  ETT to vent Lungs:    sounds of mechanical ventilation auscultated still with mild end expiratory wheezing. Peak pressures 38 (improved from 50-60) on PEEP 10 CV:         RRR no mrg Abd:      + bowel sounds; soft, non-tender; no palpable masses, no distension Ext:    No edema Skin:      Warm and dry; no rashes Neuro:   sedated, RASS -4    Resolved Hospital Problem list   hypophosphatemia Assessment & Plan:  Acute on chronic hypoxemic and hypercarbic respiratory failure  Recent Influenza A respiratory infection  Acute severe Asthma exacerbation - Continue full vent support. With LTVV and VAP bundle - SBT/SAT as able.  - PCT low. Complete azithromycin day 3/3. Continue solumedrol.  - continue lasix and metolazone - deep sedation with NMB started 12/23 with improvement in hypercapnia. Continue today  Hypertensive emergency  Acute pulmonary edema - continue lasix and metolazone - monitor carefully renal function and electrolytes - holding home meds for now  AKI Hypokalemia - likely secondary to pre-renal azotemia and ARB use - hold arb - low dose replacement of potassium given diuresis - lasix as above - monitor BMET  Diabetes mellitus with hyperclycemia HbA1c 9.9; pt is on Invokamet and Semglee 40U nightly at home.  -  continue insulin gtt - CBG goal 140-180 - not well controlled secondary to steroid induced hyperglycemia  Normocytic anemia  Likely in setting of acute illness. No active bleeding at this time - Trend CBC - Transfuse prn Hb >7  Acute metabolic encephalopathy - sedation for mechanical ventilation - continue seroquel   Best Practice (right click and "Reselect all SmartList Selections" daily)   Diet/type: tubefeeds DVT prophylaxis: prophylactic heparin  GI prophylaxis: PPI Lines: N/A Foley:  Yes, and it  is still needed Code Status:  full code Last date of multidisciplinary goals of care discussion [husband updated at the bedside 12/24]  Labs   CBC: Recent Labs  Lab 10/06/21 0620 10/06/21 0912 10/06/21 1017 10/06/21 1104 10/07/21 0520 10/08/21 0015 10/08/21 0120 10/08/21 0419 10/08/21 1353 10/08/21 1817 10/09/21 0452 10/09/21 0530 10/09/21 0916  WBC 8.6  --  17.6*  --  12.4*  --  15.1*  --   --   --   --  11.2*  --   NEUTROABS  --   --  15.6*  --   --   --   --   --   --   --   --   --   --   HGB 12.7   < > 11.7*   < > 10.9*   < > 11.3*   < > 11.6* 10.5* 9.9* 9.6* 9.9*  HCT 40.4   < > 37.6   < > 32.6*   < > 36.8   < > 34.0* 31.0* 29.0* 30.1* 29.0*  MCV 87.3  --  89.3  --  83.4  --  89.1  --   --   --   --  87.2  --   PLT 275  --  328  --  271  --  282  --   --   --   --  225  --    < > = values in this interval not displayed.    Basic Metabolic Panel: Recent Labs  Lab 10/06/21 2031 10/07/21 0520 10/07/21 1209 10/07/21 1650 10/08/21 0015 10/08/21 0120 10/08/21 0419 10/08/21 1353 10/08/21 1817 10/08/21 2119 10/09/21 0452 10/09/21 0530 10/09/21 0916  NA 138 134* 138  --    < > 136   < > 135 137  --  135 133* 136  K 3.2* 3.6 3.6  --    < > 3.8   < > 5.1 3.7  --  3.3* 3.1* 3.4*  CL 107 105 106  --   --  103  --   --   --   --   --  102  --   CO2 22 21* 22  --   --  23  --   --   --   --   --  19*  --   GLUCOSE 206* 180* 174*  --   --  282*  --   --   --   --   --  291*  --   BUN 11 11 12   --   --  19  --   --   --   --   --  39*  --   CREATININE 1.02* 0.96 1.02*  --   --  1.84*  --   --   --   --   --  2.20*  --   CALCIUM 8.3* 8.1* 8.2*  --   --  8.1*  --   --   --   --   --  7.7*  --   MG 1.6* 2.5* 2.1 2.3  --  2.6*  --   --   --  2.8*  --   --   --   PHOS  --  <1.0* 3.2 4.3  --  7.0*  --   --   --  6.4*  --   --   --    < > = values in this interval not displayed.   GFR: Estimated Creatinine Clearance: 39.1 mL/min (A) (by C-G formula based on SCr of 2.2 mg/dL  (H)). Recent Labs  Lab 10/06/21 0834 10/06/21 1017 10/06/21 1100 10/07/21 0520 10/08/21 0120 10/08/21 0320 10/08/21 1340 10/09/21 0530  PROCALCITON  --  <0.10  --   --   --   --   --   --   WBC  --  17.6*  --  12.4* 15.1*  --   --  11.2*  LATICACIDVEN 1.7  --  2.8*  --   --  4.8* 1.7  --     Liver Function Tests: Recent Labs  Lab 10/06/21 0620  AST 28  ALT 21  ALKPHOS 71  BILITOT 1.1  PROT 6.9  ALBUMIN 3.6   No results for input(s): LIPASE, AMYLASE in the last 168 hours. No results for input(s): AMMONIA in the last 168 hours.  ABG    Component Value Date/Time   PHART 7.312 (L) 10/09/2021 0916   PCO2ART 44.3 10/09/2021 0916   PO2ART 103 10/09/2021 0916   HCO3 22.5 10/09/2021 0916   TCO2 24 10/09/2021 0916   ACIDBASEDEF 4.0 (H) 10/09/2021 0916   O2SAT 97.0 10/09/2021 0916     Coagulation Profile: Recent Labs  Lab 10/06/21 1017  INR 1.0    Cardiac Enzymes: No results for input(s): CKTOTAL, CKMB, CKMBINDEX, TROPONINI in the last 168 hours.  HbA1C: Hgb A1c MFr Bld  Date/Time Value Ref Range Status  10/06/2021 01:16 PM 9.9 (H) 4.8 - 5.6 % Final    Comment:    (NOTE) Pre diabetes:          5.7%-6.4%  Diabetes:              >6.4%  Glycemic control for   <7.0% adults with diabetes     CBG: Recent Labs  Lab 10/09/21 0659 10/09/21 0723 10/09/21 0800 10/09/21 0827 10/09/21 0943  GLUCAP 276* 299* 272* 252* 194*    Critical care time:  38 minutes    The patient is critically ill due to respiratory failure.  Critical care was necessary to treat or prevent imminent or life-threatening deterioration.  Critical care was time spent personally by me on the following activities: development of treatment plan with patient and/or surrogate as well as nursing, discussions with consultants, evaluation of patient's response to treatment, examination of patient, obtaining history from patient or surrogate, ordering and performing treatments and interventions,  ordering and review of laboratory studies, ordering and review of radiographic studies, pulse oximetry, re-evaluation of patient's condition and participation in multidisciplinary rounds.   Critical Care Time devoted to patient care services described in this note is 38 minutes. This time reflects time of care of this [place] . This critical care time does not reflect separately billable procedures or procedure time, teaching time or supervisory time of PA/NP/Med student/Med Resident etc but could involve care discussion time.       [facility] 10/09/2021 10:32 AM  Pager: see AMION  If no response  to pager , please call critical care on call (see AMION) until 7pm After 7:00 pm call Elink

## 2021-10-09 NOTE — Progress Notes (Signed)
[name] Progress Note Patient Name: Sara Chan DOB: Feb 13, 1971 MRN: [id number]   Date of Service  10/09/2021  HPI/Events of Note  Glucose now 347 despite increased Levemir and additional insulin coverage  eICU Interventions  Will start insulin drip as per hyperglycemia protocol     Intervention Category Intermediate Interventions: Hyperglycemia - evaluation and treatment  Shona Needles Rozanne Heumann 10/09/2021, 12:39 AM

## 2021-10-09 NOTE — Progress Notes (Signed)
[facility] Progress Note Patient Name: Sara Chan DOB: 1970-11-05 MRN: [id number]   Date of Service  10/09/2021  HPI/Events of Note  Pt currently on 0.5 Nimbex with BIS of 58-65, Ketamine drip at 0.5. Not getting TOF at any mA. Sychronous with vent. RN asking if Ketamine can be titrated up to get desired BIS 40-60.  TOF 0  eICU Interventions  May increase ketamine to 0.75 May hold Nimbex for now. If patient becomes desynchronous again may resume Nimbex at 0.5 which is lowest dose     Intervention Category Intermediate Interventions: Other:  Judd Lien 10/09/2021, 5:24 AM

## 2021-10-10 ENCOUNTER — Inpatient Hospital Stay (HOSPITAL_COMMUNITY): Payer: BLUE CROSS/BLUE SHIELD

## 2021-10-10 LAB — CBC
HCT: 29.6 % — ABNORMAL LOW (ref 36.0–46.0)
Hemoglobin: 9.7 g/dL — ABNORMAL LOW (ref 12.0–15.0)
MCH: 27.7 pg (ref 26.0–34.0)
MCHC: 32.8 g/dL (ref 30.0–36.0)
MCV: 84.6 fL (ref 80.0–100.0)
Platelets: 261 10*3/uL (ref 150–400)
RBC: 3.5 MIL/uL — ABNORMAL LOW (ref 3.87–5.11)
RDW: 16.1 % — ABNORMAL HIGH (ref 11.5–15.5)
WBC: 14.3 10*3/uL — ABNORMAL HIGH (ref 4.0–10.5)
nRBC: 0.1 % (ref 0.0–0.2)

## 2021-10-10 LAB — BASIC METABOLIC PANEL
Anion gap: 9 (ref 5–15)
Anion gap: 10 (ref 5–15)
BUN: 50 mg/dL — ABNORMAL HIGH (ref 6–20)
BUN: 53 mg/dL — ABNORMAL HIGH (ref 6–20)
CO2: 22 mmol/L (ref 22–32)
CO2: 23 mmol/L (ref 22–32)
Calcium: 8 mg/dL — ABNORMAL LOW (ref 8.9–10.3)
Calcium: 8.2 mg/dL — ABNORMAL LOW (ref 8.9–10.3)
Chloride: 106 mmol/L (ref 98–111)
Chloride: 104 mmol/L (ref 98–111)
Creatinine, Ser: 2.07 mg/dL — ABNORMAL HIGH (ref 0.44–1.00)
Creatinine, Ser: 2.08 mg/dL — ABNORMAL HIGH (ref 0.44–1.00)
GFR, Estimated: 28 mL/min — ABNORMAL LOW (ref >60)
GFR, Estimated: 28 mL/min — ABNORMAL LOW (ref >60)
Glucose, Bld: 191 mg/dL — ABNORMAL HIGH (ref 70–99)
Glucose, Bld: 146 mg/dL — ABNORMAL HIGH (ref 70–99)
Potassium: 4.4 mmol/L (ref 3.5–5.1)
Potassium: 4 mmol/L (ref 3.5–5.1)
Sodium: 137 mmol/L (ref 135–145)
Sodium: 137 mmol/L (ref 135–145)

## 2021-10-10 LAB — GLUCOSE, CAPILLARY
Glucose-Capillary: 105 mg/dL — ABNORMAL HIGH (ref 70–99)
Glucose-Capillary: 109 mg/dL — ABNORMAL HIGH (ref 70–99)
Glucose-Capillary: 125 mg/dL — ABNORMAL HIGH (ref 70–99)
Glucose-Capillary: 129 mg/dL — ABNORMAL HIGH (ref 70–99)
Glucose-Capillary: 130 mg/dL — ABNORMAL HIGH (ref 70–99)
Glucose-Capillary: 134 mg/dL — ABNORMAL HIGH (ref 70–99)
Glucose-Capillary: 140 mg/dL — ABNORMAL HIGH (ref 70–99)
Glucose-Capillary: 148 mg/dL — ABNORMAL HIGH (ref 70–99)
Glucose-Capillary: 152 mg/dL — ABNORMAL HIGH (ref 70–99)
Glucose-Capillary: 159 mg/dL — ABNORMAL HIGH (ref 70–99)
Glucose-Capillary: 171 mg/dL — ABNORMAL HIGH (ref 70–99)
Glucose-Capillary: 176 mg/dL — ABNORMAL HIGH (ref 70–99)
Glucose-Capillary: 177 mg/dL — ABNORMAL HIGH (ref 70–99)
Glucose-Capillary: 183 mg/dL — ABNORMAL HIGH (ref 70–99)
Glucose-Capillary: 184 mg/dL — ABNORMAL HIGH (ref 70–99)
Glucose-Capillary: 192 mg/dL — ABNORMAL HIGH (ref 70–99)
Glucose-Capillary: 196 mg/dL — ABNORMAL HIGH (ref 70–99)
Glucose-Capillary: 199 mg/dL — ABNORMAL HIGH (ref 70–99)
Glucose-Capillary: 97 mg/dL (ref 70–99)

## 2021-10-10 LAB — POCT I-STAT 7, (LYTES, BLD GAS, ICA,H+H)
Acid-base deficit: 1 mmol/L (ref 0.0–2.0)
Bicarbonate: 25.5 mmol/L (ref 20.0–28.0)
Calcium, Ion: 1.17 mmol/L (ref 1.15–1.40)
HCT: 30 % — ABNORMAL LOW (ref 36.0–46.0)
Hemoglobin: 10.2 g/dL — ABNORMAL LOW (ref 12.0–15.0)
O2 Saturation: 84 %
Patient temperature: 98.6
Potassium: 4.5 mmol/L (ref 3.5–5.1)
Sodium: 137 mmol/L (ref 135–145)
TCO2: 27 mmol/L (ref 22–32)
pCO2 arterial: 49.3 mmHg — ABNORMAL HIGH (ref 32.0–48.0)
pH, Arterial: 7.321 — ABNORMAL LOW (ref 7.350–7.450)
pO2, Arterial: 53 mmHg — ABNORMAL LOW (ref 83.0–108.0)

## 2021-10-10 LAB — TRIGLYCERIDES: Triglycerides: 241 mg/dL — ABNORMAL HIGH (ref <150)

## 2021-10-10 NOTE — Progress Notes (Signed)
Entered patients room for routine ventilator check. Ventilator parameters stable and unchanged. Patients breath sounds reviled rhonchi on the right side. Patient placed on 100% and suctioned for copious amounts of thick yellow secretions. Patient had a decrease in Sp02 level(83-86). Patient placed on 100% and peep increased to 10cm. Arterial blood drawn and results passed on to [facility]. Chest x-ray ordered.

## 2021-10-10 NOTE — Progress Notes (Signed)
NAME:  Sara Chan, MRN:  [id number], DOB:  [birth year], LOS: 4 ADMISSION DATE:  10/06/2021, CONSULTATION DATE:  10/06/2021 REFERRING MD:  EDP, CHIEF COMPLAINT:  AHRF, Hypertensive emergency   History of Present Illness:  50 year old female who is known to have flu approximately 2 months ago and has never returned to her normal pulmonary baseline.  Patient has been experiencing increasing shortness of breath of the last 2 months without fevers chills or sweats.  Presnted to ED. Initially treated with steroids and bronchodilators.  Was going to have CT Chest for further evaluation but became more hypoxemic and hypercapnic reqiuiring bipap and then intubation after receiving some sedation for scan. An arterial line was placed and her BP was over 300 SBP. Nitro drip started.   Pertinent  Medical History   Past Medical History:  Diagnosis Date   Arthritis    Diabetes mellitus without complication ([place])    Hypertension     Significant Hospital Events: Including procedures, antibiotic start and stop dates in addition to other pertinent events   12/21 presented to the ED with respiratory concerns, initially requiring biPAP but then intubated after receiving sedation for scan; noted to be in hypertensive emergency. PCCM consulted for admission to ICU 12/23 NMB initaited for hypercapnia, hypoxemia, wheezing concerning for severe asthma  Interim History / Subjective:  Diuresed well. ABGs improved, bronchospasm improving with decreased peak pressures.  Still on scheduled nebs.  Mucus pluging overnight  Objective   Blood pressure 117/77, pulse (!) 124, temperature 99 F (37.2 C), temperature source Oral, resp. rate (!) 26, height 5\' 2"  (1.575 m), weight 130.6 kg, SpO2 96 %.    Vent Mode: PRVC FiO2 (%):  [40 %-60 %] 40 % Set Rate:  [26 bmp] 26 bmp Vt Set:  [400 mL] 400 mL PEEP:  [5 cmH20-8 cmH20] 8 cmH20 Plateau Pressure:  [26 cmH20-37 cmH20] 31 cmH20   Intake/Output Summary (Last 24 hours)  at 10/10/2021 1044 Last data filed at 10/10/2021 0900 Gross per 24 hour  Intake 2754.2 ml  Output 5000 ml  Net -2245.8 ml   Filed Weights   10/08/21 0500 10/09/21 0500 10/10/21 0500  Weight: 135.1 kg 132 kg 130.6 kg    Examination: Gen:      Intubated, sedated, acutely ill appearing HEENT:  ETT to vent Lungs:    sounds of mechanical ventilation auscultated, improved wheezing bilaterally CV:        tachycardic, regular Abd:      + bowel sounds; soft, non-tender; no palpable masses, no distension Ext:    No edema Skin:      Warm and dry; no rashes Neuro:   sedated, RASS -4  Labs reviewed: Na 137, Cr 2.08(stabilizing) K 4.0 WBC 14.3 Hgb 9.7  Resolved Hospital Problem list   Hypophosphatemia Hypokalemia Hypertensive emergency  Assessment & Plan:  Acute on chronic hypoxemic and hypercarbic respiratory failure  Recent Influenza A respiratory infection  Acute severe Asthma exacerbation - Continue full vent support. With LTVV and VAP bundle - SBT/SAT as able.  - PCT low. Complete azithromycin day 3/3. Continue solumedrol total five days - continue lasix and metolazone, she had a good response yesterday - will left NMB today and maintain deep sedation. Can start lifting sedation for SBT/SAT tomorrow.    Acute pulmonary edema Now in circulatory shock secondary to sedation  - continue lasix and metolazone - monitor carefully renal function and electrolytes - holding home meds for now  AKI - likely secondary to pre-renal azotemia  and ARB use - hold arb - monitor K with diuresis - lasix as above - monitor BMET  Diabetes mellitus with hyperclycemia HbA1c 9.9; pt is on Invokamet and Semglee 40U nightly at home.  - continue insulin gtt for now. Hopefully we can transition off once steroids stop and we move closer to extubation - CBG goal 140-180 - not well controlled secondary to steroid induced hyperglycemia  Normocytic anemia  Likely in setting of acute illness. No  active bleeding at this time - Trend CBC - Transfuse prn Hb >7  Acute metabolic encephalopathy - sedation for mechanical ventilation with ketamine, fentanyl, propofol, scheduled oxycodone. Can start lifting propofol first once NMB wears off.  - continue seroquel   Best Practice (right click and "Reselect all SmartList Selections" daily)   Diet/type: tubefeeds DVT prophylaxis: prophylactic heparin  GI prophylaxis: PPI Lines: N/A Foley:  Yes, and it is still needed Code Status:  full code Last date of multidisciplinary goals of care discussion [husband updated at the bedside 12/24]  Labs   CBC: Recent Labs  Lab 10/06/21 1017 10/06/21 1104 10/07/21 0520 10/08/21 0015 10/08/21 0120 10/08/21 0419 10/09/21 0452 10/09/21 0530 10/09/21 0916 10/10/21 0404 10/10/21 0800  WBC 17.6*  --  12.4*  --  15.1*  --   --  11.2*  --   --  14.3*  NEUTROABS 15.6*  --   --   --   --   --   --   --   --   --   --   HGB 11.7*   < > 10.9*   < > 11.3*   < > 9.9* 9.6* 9.9* 10.2* 9.7*  HCT 37.6   < > 32.6*   < > 36.8   < > 29.0* 30.1* 29.0* 30.0* 29.6*  MCV 89.3  --  83.4  --  89.1  --   --  87.2  --   --  84.6  PLT 328  --  271  --  282  --   --  225  --   --  261   < > = values in this interval not displayed.    Basic Metabolic Panel: Recent Labs  Lab 10/07/21 0520 10/07/21 1209 10/07/21 1650 10/08/21 0015 10/08/21 0120 10/08/21 0419 10/08/21 2119 10/09/21 0452 10/09/21 0530 10/09/21 0916 10/09/21 2322 10/10/21 0404 10/10/21 0800  NA 134* 138  --    < > 136   < >  --    < > 133* 136 137 137 137  K 3.6 3.6  --    < > 3.8   < >  --    < > 3.1* 3.4* 4.4 4.5 4.0  CL 105 106  --   --  103  --   --   --  102  --  106  --  104  CO2 21* 22  --   --  23  --   --   --  19*  --  22  --  23  GLUCOSE 180* 174*  --   --  282*  --   --   --  291*  --  191*  --  146*  BUN 11 12  --   --  19  --   --   --  39*  --  50*  --  53*  CREATININE 0.96 1.02*  --   --  1.84*  --   --   --  2.20*  --  2.07*  --  2.08*  CALCIUM 8.1* 8.2*  --   --  8.1*  --   --   --  7.7*  --  8.0*  --  8.2*  MG 2.5* 2.1 2.3  --  2.6*  --  2.8*  --   --   --   --   --   --   PHOS <1.0* 3.2 4.3  --  7.0*  --  6.4*  --   --   --   --   --   --    < > = values in this interval not displayed.   GFR: Estimated Creatinine Clearance: 41.1 mL/min (A) (by C-G formula based on SCr of 2.08 mg/dL (H)). Recent Labs  Lab 10/06/21 0834 10/06/21 1017 10/06/21 1100 10/07/21 0520 10/08/21 0120 10/08/21 0320 10/08/21 1340 10/09/21 0530 10/10/21 0800  PROCALCITON  --  <0.10  --   --   --   --   --   --   --   WBC  --  17.6*  --  12.4* 15.1*  --   --  11.2* 14.3*  LATICACIDVEN 1.7  --  2.8*  --   --  4.8* 1.7  --   --     Liver Function Tests: Recent Labs  Lab 10/06/21 0620  AST 28  ALT 21  ALKPHOS 71  BILITOT 1.1  PROT 6.9  ALBUMIN 3.6   No results for input(s): LIPASE, AMYLASE in the last 168 hours. No results for input(s): AMMONIA in the last 168 hours.  ABG    Component Value Date/Time   PHART 7.321 (L) 10/10/2021 0404   PCO2ART 49.3 (H) 10/10/2021 0404   PO2ART 53 (L) 10/10/2021 0404   HCO3 25.5 10/10/2021 0404   TCO2 27 10/10/2021 0404   ACIDBASEDEF 1.0 10/10/2021 0404   O2SAT 84.0 10/10/2021 0404     Coagulation Profile: Recent Labs  Lab 10/06/21 1017  INR 1.0    Cardiac Enzymes: No results for input(s): CKTOTAL, CKMB, CKMBINDEX, TROPONINI in the last 168 hours.  HbA1C: Hgb A1c MFr Bld  Date/Time Value Ref Range Status  10/06/2021 01:16 PM 9.9 (H) 4.8 - 5.6 % Final    Comment:    (NOTE) Pre diabetes:          5.7%-6.4%  Diabetes:              >6.4%  Glycemic control for   <7.0% adults with diabetes     CBG: Recent Labs  Lab 10/10/21 0501 10/10/21 0647 10/10/21 0749 10/10/21 0904 10/10/21 1036  GLUCAP 130* 159* 140* 125* 109*    Critical care time:  35 minutes    The patient is critically ill due to respiratory failure.  Critical care was necessary to treat  or prevent imminent or life-threatening deterioration.  Critical care was time spent personally by me on the following activities: development of treatment plan with patient and/or surrogate as well as nursing, discussions with consultants, evaluation of patient's response to treatment, examination of patient, obtaining history from patient or surrogate, ordering and performing treatments and interventions, ordering and review of laboratory studies, ordering and review of radiographic studies, pulse oximetry, re-evaluation of patient's condition and participation in multidisciplinary rounds.   Critical Care Time devoted to patient care services described in this note is 35 minutes. This time reflects time of care of this [place] . This critical care time does not reflect separately billable procedures or procedure time, teaching  time or supervisory time of PA/NP/Med student/Med Resident etc but could involve care discussion time.       Spero Geralds [place] [facility] 10/10/2021 10:44 AM  Pager: see AMION  If no response to pager , please call critical care on call (see AMION) until 7pm After 7:00 pm call Elink

## 2021-10-10 NOTE — Progress Notes (Addendum)
[facility] Progress Note Patient Name: Lonya Johannesen DOB: April 01, 1971 MRN: [id number]   Date of Service  10/10/2021  HPI/Events of Note  Patient with frequent loose stool. RN requested flexi seal.   eICU Interventions  Order placed     Intervention Category Intermediate Interventions: Other:  Margaretmary Lombard 10/10/2021, 2:29 AM  Addendum at 4:15 am Notified that patient desaturated during suctioning Seen on camera O2 sat now 98 on 100%/peep 10 and patient comfortable Will order a cxr   Addendum at 5:20 am CXR reviewed Seen again on camera Multiple plugs were suctioned Fio2 already down to 40%/peep is at 8  Continue current vent support May consider a bronch if the collapse does not clear, will need a repeat cxr in a few hours to check  D/w RN

## 2021-10-11 LAB — CBC
HCT: 30.6 % — ABNORMAL LOW (ref 36.0–46.0)
Hemoglobin: 10.3 g/dL — ABNORMAL LOW (ref 12.0–15.0)
MCH: 28 pg (ref 26.0–34.0)
MCHC: 33.7 g/dL (ref 30.0–36.0)
MCV: 83.2 fL (ref 80.0–100.0)
Platelets: 283 10*3/uL (ref 150–400)
RBC: 3.68 MIL/uL — ABNORMAL LOW (ref 3.87–5.11)
RDW: 16 % — ABNORMAL HIGH (ref 11.5–15.5)
WBC: 14.6 10*3/uL — ABNORMAL HIGH (ref 4.0–10.5)
nRBC: 0.1 % (ref 0.0–0.2)

## 2021-10-11 LAB — GLUCOSE, CAPILLARY
Glucose-Capillary: 119 mg/dL — ABNORMAL HIGH (ref 70–99)
Glucose-Capillary: 125 mg/dL — ABNORMAL HIGH (ref 70–99)
Glucose-Capillary: 128 mg/dL — ABNORMAL HIGH (ref 70–99)
Glucose-Capillary: 135 mg/dL — ABNORMAL HIGH (ref 70–99)
Glucose-Capillary: 135 mg/dL — ABNORMAL HIGH (ref 70–99)
Glucose-Capillary: 138 mg/dL — ABNORMAL HIGH (ref 70–99)
Glucose-Capillary: 140 mg/dL — ABNORMAL HIGH (ref 70–99)
Glucose-Capillary: 141 mg/dL — ABNORMAL HIGH (ref 70–99)
Glucose-Capillary: 142 mg/dL — ABNORMAL HIGH (ref 70–99)
Glucose-Capillary: 148 mg/dL — ABNORMAL HIGH (ref 70–99)
Glucose-Capillary: 169 mg/dL — ABNORMAL HIGH (ref 70–99)
Glucose-Capillary: 216 mg/dL — ABNORMAL HIGH (ref 70–99)
Glucose-Capillary: 273 mg/dL — ABNORMAL HIGH (ref 70–99)
Glucose-Capillary: 329 mg/dL — ABNORMAL HIGH (ref 70–99)

## 2021-10-11 LAB — CULTURE, BLOOD (SINGLE)
Culture: NO GROWTH
Special Requests: ADEQUATE

## 2021-10-11 LAB — BASIC METABOLIC PANEL
Anion gap: 11 (ref 5–15)
BUN: 64 mg/dL — ABNORMAL HIGH (ref 6–20)
CO2: 28 mmol/L (ref 22–32)
Calcium: 8.7 mg/dL — ABNORMAL LOW (ref 8.9–10.3)
Chloride: 102 mmol/L (ref 98–111)
Creatinine, Ser: 1.99 mg/dL — ABNORMAL HIGH (ref 0.44–1.00)
GFR, Estimated: 30 mL/min — ABNORMAL LOW (ref >60)
Glucose, Bld: 134 mg/dL — ABNORMAL HIGH (ref 70–99)
Potassium: 4.3 mmol/L (ref 3.5–5.1)
Sodium: 141 mmol/L (ref 135–145)

## 2021-10-11 LAB — CULTURE, BLOOD (ROUTINE X 2): Culture: NO GROWTH

## 2021-10-11 LAB — TRIGLYCERIDES: Triglycerides: 288 mg/dL — ABNORMAL HIGH (ref <150)

## 2021-10-11 MED ORDER — DEXTROSE 10 % IV SOLN: INTRAVENOUS | Status: DC | PRN

## 2021-10-11 MED ORDER — INSULIN ASPART 100 UNIT/ML IJ SOLN
3.0000 [IU] | INTRAMUSCULAR | Status: DC
Start: 2021-10-11 — End: 2021-10-11
  Administered 2021-10-11: 12:00:00 9 [IU] via SUBCUTANEOUS

## 2021-10-11 MED ORDER — INSULIN ASPART 100 UNIT/ML IJ SOLN
0.0000 [IU] | INTRAMUSCULAR | Status: DC
  Administered 2021-10-11: 17:00:00 15 [IU] via SUBCUTANEOUS
  Administered 2021-10-11: 3 [IU] via SUBCUTANEOUS
  Administered 2021-10-11 – 2021-10-12 (×3): 11 [IU] via SUBCUTANEOUS
  Administered 2021-10-12: 08:00:00 8 [IU] via SUBCUTANEOUS
  Administered 2021-10-12 (×2): 7 [IU] via SUBCUTANEOUS
  Administered 2021-10-13: 12:00:00 3 [IU] via SUBCUTANEOUS
  Administered 2021-10-13 (×2): 4 [IU] via SUBCUTANEOUS
  Administered 2021-10-14: 11:00:00 20 [IU] via SUBCUTANEOUS
  Administered 2021-10-14 (×2): 4 [IU] via SUBCUTANEOUS
  Administered 2021-10-14: 08:00:00 7 [IU] via SUBCUTANEOUS
  Administered 2021-10-14: 04:00:00 4 [IU] via SUBCUTANEOUS
  Administered 2021-10-15 (×4): 3 [IU] via SUBCUTANEOUS
  Administered 2021-10-15: 4 [IU] via SUBCUTANEOUS
  Administered 2021-10-16 (×2): 3 [IU] via SUBCUTANEOUS
  Administered 2021-10-16: 4 [IU] via SUBCUTANEOUS
  Administered 2021-10-16: 7 [IU] via SUBCUTANEOUS

## 2021-10-11 MED ORDER — INSULIN DETEMIR 100 UNIT/ML ~~LOC~~ SOLN
10.0000 [IU] | Freq: Once | SUBCUTANEOUS | Status: DC
  Filled 2021-10-11: qty 0.1

## 2021-10-11 MED ORDER — INSULIN DETEMIR 100 UNIT/ML ~~LOC~~ SOLN
15.0000 [IU] | Freq: Two times a day (BID) | SUBCUTANEOUS | Status: DC
  Administered 2021-10-11: 08:00:00 15 [IU] via SUBCUTANEOUS
  Filled 2021-10-11 (×2): qty 0.15

## 2021-10-11 MED ORDER — INSULIN ASPART 100 UNIT/ML IJ SOLN
7.0000 [IU] | INTRAMUSCULAR | Status: DC
  Administered 2021-10-11 – 2021-10-13 (×8): 7 [IU] via SUBCUTANEOUS

## 2021-10-11 MED ORDER — INSULIN DETEMIR 100 UNIT/ML ~~LOC~~ SOLN
15.0000 [IU] | Freq: Two times a day (BID) | SUBCUTANEOUS | Status: DC
  Filled 2021-10-11: qty 0.15

## 2021-10-11 MED ORDER — INSULIN ASPART 100 UNIT/ML IJ SOLN
5.0000 [IU] | INTRAMUSCULAR | Status: DC
  Administered 2021-10-11: 12:00:00 5 [IU] via SUBCUTANEOUS

## 2021-10-11 MED ORDER — INSULIN DETEMIR 100 UNIT/ML ~~LOC~~ SOLN
15.0000 [IU] | Freq: Two times a day (BID) | SUBCUTANEOUS | Status: DC
  Administered 2021-10-11 – 2021-10-13 (×4): 15 [IU] via SUBCUTANEOUS
  Filled 2021-10-11 (×5): qty 0.15

## 2021-10-11 NOTE — Progress Notes (Signed)
NAME:  Sara Chan, MRN:  [id number], DOB:  1970-11-29, LOS: 5 ADMISSION DATE:  10/06/2021, CONSULTATION DATE:  10/06/2021 REFERRING MD:  EDP, CHIEF COMPLAINT:  AHRF, Hypertensive emergency   History of Present Illness:  50 year old female who is known to have flu approximately 2 months ago and has never returned to her normal pulmonary baseline.  Patient has been experiencing increasing shortness of breath of the last 2 months without fevers chills or sweats.  Presnted to ED. Initially treated with steroids and bronchodilators.  Was going to have CT Chest for further evaluation but became more hypoxemic and hypercapnic reqiuiring bipap and then intubation after receiving some sedation for scan. An arterial line was placed and her BP was over 300 SBP. Nitro drip started.   Pertinent  Medical History   Past Medical History:  Diagnosis Date   Arthritis    Diabetes mellitus without complication ([place])    Hypertension     Significant Hospital Events: Including procedures, antibiotic start and stop dates in addition to other pertinent events   12/21 presented to the ED with respiratory concerns, initially requiring biPAP but then intubated after receiving sedation for scan; noted to be in hypertensive emergency. PCCM consulted for admission to ICU 12/23 NMB initaited for hypercapnia, hypoxemia, wheezing concerning for severe asthma 12/25 NMB discontinued, ABGs and bronchospasm improved  Interim History / Subjective:  having mucus plugging overnight. Diuresed well.   Objective   Blood pressure 133/76, pulse (!) 112, temperature 98.5 F (36.9 C), temperature source Oral, resp. rate (!) 26, height 5\' 2"  (1.575 m), weight 125.3 kg, SpO2 95 %.    Vent Mode: PRVC FiO2 (%):  [40 %-50 %] 40 % Set Rate:  [26 bmp] 26 bmp Vt Set:  [400 mL] 400 mL PEEP:  [8 cmH20] 8 cmH20 Plateau Pressure:  [26 cmH20-30 cmH20] 26 cmH20   Intake/Output Summary (Last 24 hours) at 10/11/2021 1053 Last data filed  at 10/11/2021 1000 Gross per 24 hour  Intake 3443.74 ml  Output 9100 ml  Net -5656.26 ml   Filed Weights   10/09/21 0500 10/10/21 0500 10/11/21 0500  Weight: 132 kg 130.6 kg 125.3 kg    Examination: Gen:      Intubated, sedated, acutely ill appearing HEENT:  ETT to vent Lungs:    sounds of mechanical ventilation auscultated, wheezing improved. Peak pressures 32, plateau 26 on 10 of peep CV:         tachycardic, regular Abd:      Obese, soft Ext:    No edema Skin:      Warm and dry; no rashes Neuro:   sedated, RASS -3, moves all 4 extremities   Labs/imaging reviewed: Chest xray shows RUL mucus plugging Cr improving   Resolved Hospital Problem list   Hypophosphatemia Hypokalemia Hypertensive emergency Circulatory shock Assessment & Plan:  Acute on chronic hypoxemic and hypercarbic respiratory failure  Recent Influenza A respiratory infection  Acute severe Asthma exacerbation Mucus plugging - PCT low. Completed azithromycin day 3/3. Continue solumedrol total five days - Continue full vent support. With LTVV and VAP bundle - SBT/SAT as able.  - continue lasix today. She is diuresing well. I think her current elevated peak pressures are more related to her mucus plugging rather than bronchospasm.  - will start lifting sedation.  - she will probably need extubation to bipap when the time comes.   Acute pulmonary edema - continue lasix - monitor carefully renal function and electrolytes - holding home meds for HTN  for now  AKI - likely secondary to pre-renal azotemia and ARB use - hold arb - monitor K with diuresis - lasix as above. Her Cr is improving with diuresis - monitor BMET  Diabetes mellitus with hyperclycemia HbA1c 9.9; pt is on Invokamet and Semglee 40U nightly at home.  - transitioning off insulin gtt today.  - CBG goal 140-180 - not well controlled secondary to steroid induced hyperglycemia  Normocytic anemia  Likely in setting of acute illness. No  active bleeding at this time - Trend CBC - Transfuse prn Hb >7  Acute metabolic encephalopathy - sedation for mechanical ventilation with ketamine, fentanyl, propofol, scheduled oxycodone.  - lift propofol first, then ketamine.  - continue seroquel   Best Practice (right click and "Reselect all SmartList Selections" daily)   Diet/type: tubefeeds DVT prophylaxis: prophylactic heparin  GI prophylaxis: PPI Lines: N/A Foley:  Yes, and it is still needed Code Status:  full code Last date of multidisciplinary goals of care discussion [husband updated at the bedside 12/26]  Labs   CBC: Recent Labs  Lab 10/06/21 1017 10/06/21 1104 10/07/21 0520 10/08/21 0015 10/08/21 0120 10/08/21 0419 10/09/21 0530 10/09/21 0916 10/10/21 0404 10/10/21 0800 10/11/21 0446  WBC 17.6*  --  12.4*  --  15.1*  --  11.2*  --   --  14.3* 14.6*  NEUTROABS 15.6*  --   --   --   --   --   --   --   --   --   --   HGB 11.7*   < > 10.9*   < > 11.3*   < > 9.6* 9.9* 10.2* 9.7* 10.3*  HCT 37.6   < > 32.6*   < > 36.8   < > 30.1* 29.0* 30.0* 29.6* 30.6*  MCV 89.3  --  83.4  --  89.1  --  87.2  --   --  84.6 83.2  PLT 328  --  271  --  282  --  225  --   --  261 283   < > = values in this interval not displayed.    Basic Metabolic Panel: Recent Labs  Lab 10/07/21 0520 10/07/21 1209 10/07/21 1650 10/08/21 0015 10/08/21 0120 10/08/21 0419 10/08/21 2119 10/09/21 0452 10/09/21 0530 10/09/21 0916 10/09/21 2322 10/10/21 0404 10/10/21 0800 10/11/21 0446  NA 134* 138  --    < > 136   < >  --    < > 133* 136 137 137 137 141  K 3.6 3.6  --    < > 3.8   < >  --    < > 3.1* 3.4* 4.4 4.5 4.0 4.3  CL 105 106  --   --  103  --   --   --  102  --  106  --  104 102  CO2 21* 22  --   --  23  --   --   --  19*  --  22  --  23 28  GLUCOSE 180* 174*  --   --  282*  --   --   --  291*  --  191*  --  146* 134*  BUN 11 12  --   --  19  --   --   --  39*  --  50*  --  53* 64*  CREATININE 0.96 1.02*  --   --  1.84*  --    --   --  2.20*  --  2.07*  --  2.08* 1.99*  CALCIUM 8.1* 8.2*  --   --  8.1*  --   --   --  7.7*  --  8.0*  --  8.2* 8.7*  MG 2.5* 2.1 2.3  --  2.6*  --  2.8*  --   --   --   --   --   --   --   PHOS <1.0* 3.2 4.3  --  7.0*  --  6.4*  --   --   --   --   --   --   --    < > = values in this interval not displayed.   GFR: Estimated Creatinine Clearance: 41.9 mL/min (A) (by C-G formula based on SCr of 1.99 mg/dL (H)). Recent Labs  Lab 10/06/21 0834 10/06/21 1017 10/06/21 1100 10/07/21 0520 10/08/21 0120 10/08/21 0320 10/08/21 1340 10/09/21 0530 10/10/21 0800 10/11/21 0446  PROCALCITON  --  <0.10  --   --   --   --   --   --   --   --   WBC  --  17.6*  --    < > 15.1*  --   --  11.2* 14.3* 14.6*  LATICACIDVEN 1.7  --  2.8*  --   --  4.8* 1.7  --   --   --    < > = values in this interval not displayed.    Liver Function Tests: Recent Labs  Lab 10/06/21 0620  AST 28  ALT 21  ALKPHOS 71  BILITOT 1.1  PROT 6.9  ALBUMIN 3.6   No results for input(s): LIPASE, AMYLASE in the last 168 hours. No results for input(s): AMMONIA in the last 168 hours.  ABG    Component Value Date/Time   PHART 7.321 (L) 10/10/2021 0404   PCO2ART 49.3 (H) 10/10/2021 0404   PO2ART 53 (L) 10/10/2021 0404   HCO3 25.5 10/10/2021 0404   TCO2 27 10/10/2021 0404   ACIDBASEDEF 1.0 10/10/2021 0404   O2SAT 84.0 10/10/2021 0404     Coagulation Profile: Recent Labs  Lab 10/06/21 1017  INR 1.0    Cardiac Enzymes: No results for input(s): CKTOTAL, CKMB, CKMBINDEX, TROPONINI in the last 168 hours.  HbA1C: Hgb A1c MFr Bld  Date/Time Value Ref Range Status  10/06/2021 01:16 PM 9.9 (H) 4.8 - 5.6 % Final    Comment:    (NOTE) Pre diabetes:          5.7%-6.4%  Diabetes:              >6.4%  Glycemic control for   <7.0% adults with diabetes     CBG: Recent Labs  Lab 10/11/21 0509 10/11/21 0614 10/11/21 0716 10/11/21 0815 10/11/21 0921  GLUCAP 128* 140* 138* 125* 169*    Critical  care time:  34 minutes     The patient is critically ill due to respiratory failure.  Critical care was necessary to treat or prevent imminent or life-threatening deterioration.  Critical care was time spent personally by me on the following activities: development of treatment plan with patient and/or surrogate as well as nursing, discussions with consultants, evaluation of patient's response to treatment, examination of patient, obtaining history from patient or surrogate, ordering and performing treatments and interventions, ordering and review of laboratory studies, ordering and review of radiographic studies, pulse oximetry, re-evaluation of patient's condition and participation in multidisciplinary rounds.   Critical Care Time devoted to patient  care services described in this note is 34 minutes. This time reflects time of care of this [place] . This critical care time does not reflect separately billable procedures or procedure time, teaching time or supervisory time of PA/NP/Med student/Med Resident etc but could involve care discussion time.       Spero Geralds [facility] 10/11/2021 10:53 AM  Pager: see AMION  If no response to pager , please call critical care on call (see AMION) until 7pm After 7:00 pm call Elink

## 2021-10-12 ENCOUNTER — Inpatient Hospital Stay: Payer: Self-pay

## 2021-10-12 ENCOUNTER — Inpatient Hospital Stay (HOSPITAL_COMMUNITY): Payer: BLUE CROSS/BLUE SHIELD

## 2021-10-12 LAB — CBC
HCT: 30.2 % — ABNORMAL LOW (ref 36.0–46.0)
Hemoglobin: 10.1 g/dL — ABNORMAL LOW (ref 12.0–15.0)
MCH: 27.9 pg (ref 26.0–34.0)
MCHC: 33.4 g/dL (ref 30.0–36.0)
MCV: 83.4 fL (ref 80.0–100.0)
Platelets: 294 10*3/uL (ref 150–400)
RBC: 3.62 MIL/uL — ABNORMAL LOW (ref 3.87–5.11)
RDW: 15.8 % — ABNORMAL HIGH (ref 11.5–15.5)
WBC: 13.9 10*3/uL — ABNORMAL HIGH (ref 4.0–10.5)
nRBC: 0.6 % — ABNORMAL HIGH (ref 0.0–0.2)

## 2021-10-12 LAB — BASIC METABOLIC PANEL
Anion gap: 12 (ref 5–15)
BUN: 76 mg/dL — ABNORMAL HIGH (ref 6–20)
CO2: 29 mmol/L (ref 22–32)
Calcium: 8.8 mg/dL — ABNORMAL LOW (ref 8.9–10.3)
Chloride: 100 mmol/L (ref 98–111)
Creatinine, Ser: 2.01 mg/dL — ABNORMAL HIGH (ref 0.44–1.00)
GFR, Estimated: 29 mL/min — ABNORMAL LOW (ref >60)
Glucose, Bld: 143 mg/dL — ABNORMAL HIGH (ref 70–99)
Potassium: 3.2 mmol/L — ABNORMAL LOW (ref 3.5–5.1)
Sodium: 141 mmol/L (ref 135–145)

## 2021-10-12 LAB — POCT I-STAT 7, (LYTES, BLD GAS, ICA,H+H)
Acid-Base Excess: 7 mmol/L — ABNORMAL HIGH (ref 0.0–2.0)
Bicarbonate: 31.2 mmol/L — ABNORMAL HIGH (ref 20.0–28.0)
Calcium, Ion: 1.15 mmol/L (ref 1.15–1.40)
HCT: 33 % — ABNORMAL LOW (ref 36.0–46.0)
Hemoglobin: 11.2 g/dL — ABNORMAL LOW (ref 12.0–15.0)
O2 Saturation: 96 %
Patient temperature: 99.9
Potassium: 3.4 mmol/L — ABNORMAL LOW (ref 3.5–5.1)
Sodium: 141 mmol/L (ref 135–145)
TCO2: 32 mmol/L (ref 22–32)
pCO2 arterial: 40.7 mmHg (ref 32.0–48.0)
pH, Arterial: 7.495 — ABNORMAL HIGH (ref 7.350–7.450)
pO2, Arterial: 77 mmHg — ABNORMAL LOW (ref 83.0–108.0)

## 2021-10-12 LAB — GLUCOSE, CAPILLARY
Glucose-Capillary: 65 mg/dL — ABNORMAL LOW (ref 70–99)
Glucose-Capillary: 157 mg/dL — ABNORMAL HIGH (ref 70–99)
Glucose-Capillary: 166 mg/dL — ABNORMAL HIGH (ref 70–99)
Glucose-Capillary: 210 mg/dL — ABNORMAL HIGH (ref 70–99)
Glucose-Capillary: 295 mg/dL — ABNORMAL HIGH (ref 70–99)
Glucose-Capillary: 292 mg/dL — ABNORMAL HIGH (ref 70–99)
Glucose-Capillary: 212 mg/dL — ABNORMAL HIGH (ref 70–99)

## 2021-10-12 LAB — TRIGLYCERIDES: Triglycerides: 292 mg/dL — ABNORMAL HIGH (ref <150)

## 2021-10-12 LAB — POTASSIUM: Potassium: 3.6 mmol/L (ref 3.5–5.1)

## 2021-10-12 MED ORDER — DEXTROSE 50 % IV SOLN: 12.5000 g | INTRAVENOUS | Status: AC

## 2021-10-12 MED ORDER — POTASSIUM CHLORIDE 20 MEQ PO PACK
40.0000 meq | PACK | Freq: Once | ORAL | Status: AC
  Administered 2021-10-12: 06:00:00 40 meq
  Filled 2021-10-12: qty 2

## 2021-10-12 MED ORDER — ALBUTEROL SULFATE (2.5 MG/3ML) 0.083% IN NEBU
2.5000 mg | INHALATION_SOLUTION | Freq: Four times a day (QID) | RESPIRATORY_TRACT | Status: DC
  Administered 2021-10-12 – 2021-10-17 (×20): 2.5 mg via RESPIRATORY_TRACT
  Filled 2021-10-12 (×21): qty 3

## 2021-10-12 NOTE — Progress Notes (Signed)
Patient had hypoglycemic event at 0313. Dextrose 50% was administered and following reading was 157. In other regards, 25 ml of Ketamine was wasted with Kathleen Lime RN after replacing IV tubing.

## 2021-10-12 NOTE — Progress Notes (Signed)
SBT attempted with pt this am. Pt placed on CPAP/PS. No pt effort at this time. Pt returned to full support settings. RT will continue to monitor and be available as needed.

## 2021-10-12 NOTE — Progress Notes (Signed)
Spoke with RN, made aware PICC would not be placed until 12/28.

## 2021-10-12 NOTE — Progress Notes (Signed)
NAME:  Sara Chan, MRN:  [id number], DOB:  June 12, 1971, LOS: 6 ADMISSION DATE:  10/06/2021, CONSULTATION DATE:  10/06/2021 REFERRING MD:  EDP, CHIEF COMPLAINT:  AHRF, Hypertensive emergency   History of Present Illness:  50 year old female who is known to have flu approximately 2 months ago and has never returned to her normal pulmonary baseline.  Patient has been experiencing increasing shortness of breath of the last 2 months without fevers chills or sweats.  Presnted to ED. Initially treated with steroids and bronchodilators.  Was going to have CT Chest for further evaluation but became more hypoxemic and hypercapnic reqiuiring bipap and then intubation after receiving some sedation for scan. An arterial line was placed and her BP was over 300 SBP. Nitro drip started.   Pertinent  Medical History   Past Medical History:  Diagnosis Date   Arthritis    Diabetes mellitus without complication ([place])    Hypertension    Significant Hospital Events: Including procedures, antibiotic start and stop dates in addition to other pertinent events   12/21 presented to the ED with respiratory concerns, initially requiring biPAP but then intubated after receiving sedation for scan; noted to be in hypertensive emergency. PCCM consulted for admission to ICU 12/23 NMB initaited for hypercapnia, hypoxemia, wheezing concerning for severe asthma 12/25 NMB discontinued, ABGs and bronchospasm improved  Interim History / Subjective:  having mucus plugging overnight. Diuresed well.  Concern for mucous plugging Did not wean well this morning-no patient effort  Objective   Blood pressure (!) 135/93, pulse (!) 115, temperature 100.1 F (37.8 C), temperature source Oral, resp. rate (!) 26, height 5\' 2"  (1.575 m), weight 122.7 kg, SpO2 94 %.    Vent Mode: PRVC FiO2 (%):  [40 %] 40 % Set Rate:  [26 bmp] 26 bmp Vt Set:  [400 mL] 400 mL PEEP:  [8 cmH20] 8 cmH20 Plateau Pressure:  [24 RCB63-84 cmH20] 26 cmH20    Intake/Output Summary (Last 24 hours) at 10/12/2021 5364 Last data filed at 10/12/2021 0800 Gross per 24 hour  Intake 3208.18 ml  Output 5625 ml  Net -2416.82 ml   Filed Weights   10/10/21 0500 10/11/21 0500 10/12/21 0500  Weight: 130.6 kg 125.3 kg 122.7 kg    Examination: Gen:   Acutely ill-appearing, sedated  HEENT: Endotracheal tube in place Lungs:   Minimal wheezing, some rhonchi CV:         S1-S2 appreciated Abd:      Bowel sounds appreciated Ext:    No edema Skin:      Warm and dry; no rashes Neuro:   Sedated   Labs/imaging reviewed: Right upper lobe atelectasis-reviewed by myself   Resolved Hospital Problem list   Hypophosphatemia Hypokalemia Hypertensive emergency Circulatory shock Assessment & Plan:   Acute on chronic hypoxemic and hypercarbic respiratory failure Recent influenza a respiratory infection Acute severe exacerbation of asthma Mucous plugging -Follow-up on chest x-ray findings -Wean sedation as tolerated -At present still requires full vent support as respiratory effort remains very poor  Acute pulmonary edema -Continue Lasix -Monitor renal parameters -Hold home antihypertensives  Acute kidney injury -Lasix -Monitor electrolytes -Avoid nephrotoxic's  Diabetes mellitus Steroid-induced hyperglycemia -Continue SSI  Normocytic anemia -No evidence of acute bleeding -Trend CBC  Metabolic encephalopathy -Wean sedation -Continue Seroquel  Not ready for extubation -Failed weaning this morning -Will continue to evaluate   Best Practice (right click and "Reselect all SmartList Selections" daily)   Diet/type: tubefeeds DVT prophylaxis: prophylactic heparin  GI prophylaxis: PPI Lines:  N/A Foley:  Yes, and it is still needed Code Status:  full code Last date of multidisciplinary goals of care discussion [husband updated at the bedside 12/26]  Labs   CBC: Recent Labs  Lab 10/06/21 1017 10/06/21 1104 10/08/21 0120  10/08/21 0419 10/09/21 0530 10/09/21 0916 10/10/21 0404 10/10/21 0800 10/11/21 0446 10/12/21 0411 10/12/21 0440  WBC 17.6*   < > 15.1*  --  11.2*  --   --  14.3* 14.6*  --  13.9*  NEUTROABS 15.6*  --   --   --   --   --   --   --   --   --   --   HGB 11.7*   < > 11.3*   < > 9.6*   < > 10.2* 9.7* 10.3* 11.2* 10.1*  HCT 37.6   < > 36.8   < > 30.1*   < > 30.0* 29.6* 30.6* 33.0* 30.2*  MCV 89.3   < > 89.1  --  87.2  --   --  84.6 83.2  --  83.4  PLT 328   < > 282  --  225  --   --  261 283  --  294   < > = values in this interval not displayed.    Basic Metabolic Panel: Recent Labs  Lab 10/07/21 0520 10/07/21 1209 10/07/21 1650 10/08/21 0015 10/08/21 0120 10/08/21 0419 10/08/21 2119 10/09/21 0452 10/09/21 0530 10/09/21 0916 10/09/21 2322 10/10/21 0404 10/10/21 0800 10/11/21 0446 10/12/21 0411 10/12/21 0440  NA 134* 138  --    < > 136   < >  --    < > 133*   < > 137 137 137 141 141 141  K 3.6 3.6  --    < > 3.8   < >  --    < > 3.1*   < > 4.4 4.5 4.0 4.3 3.4* 3.2*  CL 105 106  --   --  103  --   --   --  102  --  106  --  104 102  --  100  CO2 21* 22  --   --  23  --   --   --  19*  --  22  --  23 28  --  29  GLUCOSE 180* 174*  --   --  282*  --   --   --  291*  --  191*  --  146* 134*  --  143*  BUN 11 12  --   --  19  --   --   --  39*  --  50*  --  53* 64*  --  76*  CREATININE 0.96 1.02*  --   --  1.84*  --   --   --  2.20*  --  2.07*  --  2.08* 1.99*  --  2.01*  CALCIUM 8.1* 8.2*  --   --  8.1*  --   --   --  7.7*  --  8.0*  --  8.2* 8.7*  --  8.8*  MG 2.5* 2.1 2.3  --  2.6*  --  2.8*  --   --   --   --   --   --   --   --   --   PHOS <1.0* 3.2 4.3  --  7.0*  --  6.4*  --   --   --   --   --   --   --   --   --    < > =  values in this interval not displayed.   GFR: Estimated Creatinine Clearance: 40.9 mL/min (A) (by C-G formula based on SCr of 2.01 mg/dL (H)). Recent Labs  Lab 10/06/21 0834 10/06/21 1017 10/06/21 1100 10/07/21 0520 10/08/21 0320 10/08/21 1340  10/09/21 0530 10/10/21 0800 10/11/21 0446 10/12/21 0440  PROCALCITON  --  <0.10  --   --   --   --   --   --   --   --   WBC  --  17.6*  --    < >  --   --  11.2* 14.3* 14.6* 13.9*  LATICACIDVEN 1.7  --  2.8*  --  4.8* 1.7  --   --   --   --    < > = values in this interval not displayed.    Liver Function Tests: Recent Labs  Lab 10/06/21 0620  AST 28  ALT 21  ALKPHOS 71  BILITOT 1.1  PROT 6.9  ALBUMIN 3.6   No results for input(s): LIPASE, AMYLASE in the last 168 hours. No results for input(s): AMMONIA in the last 168 hours.  ABG    Component Value Date/Time   PHART 7.495 (H) 10/12/2021 0411   PCO2ART 40.7 10/12/2021 0411   PO2ART 77 (L) 10/12/2021 0411   HCO3 31.2 (H) 10/12/2021 0411   TCO2 32 10/12/2021 0411   ACIDBASEDEF 1.0 10/10/2021 0404   O2SAT 96.0 10/12/2021 0411     Coagulation Profile: Recent Labs  Lab 10/06/21 1017  INR 1.0    Cardiac Enzymes: No results for input(s): CKTOTAL, CKMB, CKMBINDEX, TROPONINI in the last 168 hours.  HbA1C: Hgb A1c MFr Bld  Date/Time Value Ref Range Status  10/06/2021 01:16 PM 9.9 (H) 4.8 - 5.6 % Final    Comment:    (NOTE) Pre diabetes:          5.7%-6.4%  Diabetes:              >6.4%  Glycemic control for   <7.0% adults with diabetes     CBG: Recent Labs  Lab 10/11/21 1916 10/11/21 2312 10/12/21 0313 10/12/21 0336 10/12/21 0738  GLUCAP 273* 142* 65* 157* 166*   The patient is critically ill with multiple organ systems failure and requires high complexity decision making for assessment and support, frequent evaluation and titration of therapies, application of advanced monitoring technologies and extensive interpretation of multiple databases. Critical Care Time devoted to patient care services described in this note independent of APP/resident time (if applicable)  is 35 minutes.   Sherrilyn Rist MD [facility] Personal pager: See Amion If unanswered, please page CCM On-call:  [phone]

## 2021-10-12 NOTE — Progress Notes (Signed)
[facility] Progress Note Patient Name: Temica Righetti DOB: 1971-09-25 MRN: [id number]   Date of Service  10/12/2021  HPI/Events of Note  K 3.2, Cr 2.01.  eICU Interventions  40 meq oral K x 1 per tube Is on lasix Repeat K at 10 am      Intervention Category Major Interventions: Electrolyte abnormality - evaluation and management  Margaretmary Lombard 10/12/2021, 5:24 AM

## 2021-10-13 LAB — CBC
HCT: 30.8 % — ABNORMAL LOW (ref 36.0–46.0)
Hemoglobin: 10 g/dL — ABNORMAL LOW (ref 12.0–15.0)
MCH: 27.6 pg (ref 26.0–34.0)
MCHC: 32.5 g/dL (ref 30.0–36.0)
MCV: 85.1 fL (ref 80.0–100.0)
Platelets: 343 10*3/uL (ref 150–400)
RBC: 3.62 MIL/uL — ABNORMAL LOW (ref 3.87–5.11)
RDW: 16 % — ABNORMAL HIGH (ref 11.5–15.5)
WBC: 11.3 10*3/uL — ABNORMAL HIGH (ref 4.0–10.5)
nRBC: 0.9 % — ABNORMAL HIGH (ref 0.0–0.2)

## 2021-10-13 LAB — GLUCOSE, CAPILLARY
Glucose-Capillary: 112 mg/dL — ABNORMAL HIGH (ref 70–99)
Glucose-Capillary: 96 mg/dL (ref 70–99)
Glucose-Capillary: 145 mg/dL — ABNORMAL HIGH (ref 70–99)
Glucose-Capillary: 173 mg/dL — ABNORMAL HIGH (ref 70–99)
Glucose-Capillary: 160 mg/dL — ABNORMAL HIGH (ref 70–99)
Glucose-Capillary: 174 mg/dL — ABNORMAL HIGH (ref 70–99)

## 2021-10-13 LAB — TRIGLYCERIDES: Triglycerides: 355 mg/dL — ABNORMAL HIGH (ref <150)

## 2021-10-13 LAB — BASIC METABOLIC PANEL
Anion gap: 17 — ABNORMAL HIGH (ref 5–15)
BUN: 87 mg/dL — ABNORMAL HIGH (ref 6–20)
CO2: 28 mmol/L (ref 22–32)
Calcium: 9 mg/dL (ref 8.9–10.3)
Chloride: 102 mmol/L (ref 98–111)
Creatinine, Ser: 1.9 mg/dL — ABNORMAL HIGH (ref 0.44–1.00)
GFR, Estimated: 31 mL/min — ABNORMAL LOW (ref >60)
Glucose, Bld: 91 mg/dL (ref 70–99)
Potassium: 3 mmol/L — ABNORMAL LOW (ref 3.5–5.1)
Sodium: 147 mmol/L — ABNORMAL HIGH (ref 135–145)

## 2021-10-13 MED ORDER — VITAL AF 1.2 CAL PO LIQD
1000.0000 mL | ORAL | Status: DC
  Administered 2021-10-13: 16:00:00 1000 mL

## 2021-10-13 MED ORDER — FREE WATER
200.0000 mL | Status: AC
  Administered 2021-10-13 (×4): 200 mL

## 2021-10-13 MED ORDER — MIDAZOLAM HCL 2 MG/2ML IJ SOLN
1.0000 mg | INTRAMUSCULAR | Status: DC | PRN
Start: 2021-10-13 — End: 2021-10-14
  Administered 2021-10-13: 21:00:00 2 mg via INTRAVENOUS
  Filled 2021-10-13: qty 2

## 2021-10-13 MED ORDER — INSULIN ASPART 100 UNIT/ML IJ SOLN
4.0000 [IU] | INTRAMUSCULAR | Status: DC
  Administered 2021-10-13 – 2021-10-14 (×6): 4 [IU] via SUBCUTANEOUS

## 2021-10-13 MED ORDER — POTASSIUM CHLORIDE 10 MEQ/50ML IV SOLN
10.0000 meq | INTRAVENOUS | Status: AC
  Administered 2021-10-13 (×4): 10 meq via INTRAVENOUS
  Filled 2021-10-13 (×4): qty 50

## 2021-10-13 MED ORDER — DEXMEDETOMIDINE HCL IN NACL 400 MCG/100ML IV SOLN
0.0000 ug/kg/h | INTRAVENOUS | Status: DC
  Administered 2021-10-13 – 2021-10-14 (×2): 0.4 ug/kg/h via INTRAVENOUS
  Filled 2021-10-13 (×2): qty 100

## 2021-10-13 MED ORDER — INSULIN DETEMIR 100 UNIT/ML ~~LOC~~ SOLN
10.0000 [IU] | Freq: Two times a day (BID) | SUBCUTANEOUS | Status: DC
  Administered 2021-10-13 – 2021-10-14 (×2): 10 [IU] via SUBCUTANEOUS
  Filled 2021-10-13 (×3): qty 0.1

## 2021-10-13 MED ORDER — POTASSIUM CHLORIDE 20 MEQ PO PACK
20.0000 meq | PACK | ORAL | Status: AC
  Administered 2021-10-13 (×2): 20 meq
  Filled 2021-10-13 (×2): qty 1

## 2021-10-13 NOTE — Progress Notes (Signed)
Attempted x2 unsuccessfully to reach husband by telephone for PICC consent.  Caryl Pina, RN updated.  Will attempt to call again later.  PICC unable to be placed until consent is obtained.

## 2021-10-13 NOTE — Progress Notes (Signed)
Husband at bedside, consent obtained for PICC.  Will return later to insert.

## 2021-10-13 NOTE — Progress Notes (Addendum)
Nutrition Follow-up  DOCUMENTATION CODES:   Morbid obesity  INTERVENTION:   Continue tube feeding via OG tube: Vital AF 1.2 at 70 ml/h (1680 ml per day) D/C Prosource TF 45 ml once daily  Provides 2016 kcal, 126 gm protein, 1362 ml free water daily.  Continue MVI with minerals daily via tube.   NUTRITION DIAGNOSIS:   Inadequate oral intake related to inability to eat as evidenced by NPO status.  Ongoing   GOAL:   Patient will meet greater than or equal to 90% of their needs  Met with TF  MONITOR:   Vent status, TF tolerance, Labs  REASON FOR ASSESSMENT:   Ventilator, Consult Enteral/tube feeding initiation and management  ASSESSMENT:   50 yo female admitted with acute respiratory failure, hypertensive emergency. Intubated on admission. PMH includes flu 2 months PTA, arthritis, DM, HTN.  Discussed patient in ICU rounds and with RN today. Patient is off Ketamine. Remains on Fentanyl. Lasix being held today. Plans for PICC placement today. RN was able to stop propofol today. Will adjust TF orders to better meet nutrition needs.   Currently receiving Vital HP at 55 ml/h via OGT (1320 ml/day) with Prosource TF 45 ml once daily. Free water flushes 200 ml every 4 hours. Total intake with TF + free water flushes is 1360 kcal, 126 gm protein, 2304 ml free water daily.  Patient is currently intubated on ventilator support MV: 10.7 L/min Temp (24hrs), Avg:98.8 F (37.1 C), Min:98.2 F (36.8 C), Max:100 F (37.8 C)   Labs reviewed. Na 147, K 3, TG 355 CBG: [phone]  Medications reviewed and include Colace, Novolog, Levemir, MVI with minerals, Protonix, Precedex, Fentanyl, propofol.  Admission weight 129.7 kg Current weight 122.7 kg   I/O net -4.5 L since admission  Diet Order:   Diet Order             Diet NPO time specified  Diet effective now                   EDUCATION NEEDS:   Not appropriate for education at this time  Skin:  Skin  Assessment: Reviewed RN Assessment  Last BM:  no BM documented  Height:   Ht Readings from Last 1 Encounters:  10/06/21 $RemoveB'5\' 2"'AUXewJgm$  (1.575 m)    Weight:   Wt Readings from Last 1 Encounters:  10/12/21 122.7 kg    BMI:  Body mass index is 49.48 kg/m.  Estimated Nutritional Needs:   Kcal:  1800-2000  Protein:  120-130 gm  Fluid:  >/= 2 L    Lucas Mallow, RD, LDN, CNSC Please refer to Amion for contact information.

## 2021-10-13 NOTE — Progress Notes (Signed)
Inpatient Diabetes Program Recommendations  AACE/ADA: New Consensus Statement on Inpatient Glycemic Control (2015)  Target Ranges:  Prepandial:   less than 140 mg/dL      Peak postprandial:   less than 180 mg/dL (1-2 hours)      Critically ill patients:  140 - 180 mg/dL   Lab Results  Component Value Date   GLUCAP 145 (H) 10/13/2021   HGBA1C 9.9 (H) 10/06/2021    Review of Glycemic Control  Latest Reference Range & Units 10/12/21 19:24 10/12/21 23:19 10/13/21 03:15 10/13/21 07:32 10/13/21 11:07  Glucose-Capillary 70 - 99 mg/dL 292 (H) 212 (H) 112 (H) 96 145 (H)  (H): Data is abnormally high Diabetes history: DM 2 Outpatient Diabetes medications:  Invokamet 50-500 mg daily Current orders for Inpatient glycemic control:  Levemir 15 units Bid, Novolog 7 units Q4H, Novolog 0-20 units Q4H    Inpatient Diabetes Program Recommendations:    Now that steroids complete and mild low yesterday, consider reducing correction to Novolog 0-9 units Q4H.   Thanks, Bronson Curb, MSN, RNC-OB Diabetes Coordinator [phone] (8a-5p)

## 2021-10-13 NOTE — Progress Notes (Signed)
Wasted 150 ml ketamine gtt w/ Alver Fisher, RN

## 2021-10-13 NOTE — Progress Notes (Signed)
Peripherally Inserted Central Catheter Placement  The IV Nurse has discussed with the patient and/or persons authorized to consent for the patient, the purpose of this procedure and the potential benefits and risks involved with this procedure.  The benefits include less needle sticks, lab draws from the catheter, and the patient may be discharged home with the catheter. Risks include, but not limited to, infection, bleeding, blood clot (thrombus formation), and puncture of an artery; nerve damage and irregular heartbeat and possibility to perform a PICC exchange if needed/ordered by physician.  Alternatives to this procedure were also discussed.  Bard Power PICC patient education guide, fact sheet on infection prevention and patient information card has been provided to patient /or left at bedside.  PICC placed by Christella Noa, RN.  Consent obtained from husband due to altered mental status.  PICC Placement Documentation  PICC Double Lumen 10/13/21 PICC Right Brachial 40 cm 0 cm (Active)  Indication for Insertion or Continuance of Line Prolonged intravenous therapies;Poor Vasculature-patient has had multiple peripheral attempts or PIVs lasting less than 24 hours;Limited venous access - need for IV therapy >5 days (PICC only) 10/13/21 1746  Exposed Catheter (cm) 0 cm 10/13/21 1746  Site Assessment Clean;Dry;Intact 10/13/21 1746  Lumen #1 Status Flushed;Saline locked;Blood return noted 10/13/21 1746  Lumen #2 Status Flushed;Saline locked;Blood return noted 10/13/21 1746  Dressing Type Transparent 10/13/21 1746  Dressing Status Clean;Dry;Intact 10/13/21 1746  Antimicrobial disc in place? Yes 10/13/21 1746  Safety Lock Not Applicable 30/86/57 8469  Line Care Connections checked and tightened 10/13/21 1746  Line Adjustment (NICU/IV Team Only) No 10/13/21 1746  Dressing Intervention New dressing 10/13/21 1746  Dressing Change Due 10/20/21 10/13/21 1746       Kela Baccari, Nicolette Bang 10/13/2021,  5:48 PM

## 2021-10-13 NOTE — Progress Notes (Signed)
NAME:  Sara Chan, MRN:  [id number], DOB:  [birth year], LOS: 7 ADMISSION DATE:  10/06/2021, CONSULTATION DATE:  10/06/2021 REFERRING MD:  EDP, CHIEF COMPLAINT:  AHRF, Hypertensive emergency   History of Present Illness:  50 year old female who is known to have flu approximately 2 months ago and has never returned to her normal pulmonary baseline.  Patient has been experiencing increasing shortness of breath of the last 2 months without fevers chills or sweats.  Presnted to ED. Initially treated with steroids and bronchodilators.  Was going to have CT Chest for further evaluation but became more hypoxemic and hypercapnic reqiuiring bipap and then intubation after receiving some sedation for scan. An arterial line was placed and her BP was over 300 SBP. Nitro drip started.   Pertinent  Medical History   Past Medical History:  Diagnosis Date   Arthritis    Diabetes mellitus without complication ([place])    Hypertension    Significant Hospital Events: Including procedures, antibiotic start and stop dates in addition to other pertinent events   12/21 presented to the ED with respiratory concerns, initially requiring biPAP but then intubated after receiving sedation for scan; noted to be in hypertensive emergency. PCCM consulted for admission to ICU 12/23 NMB initaited for hypercapnia, hypoxemia, wheezing concerning for severe asthma 12/25 NMB discontinued, ABGs and bronchospasm improved 12/28 still with significant secretions overnight  Interim History / Subjective:   Chest x-ray did improve relating to concern for mucous plugging No other significant overnight events  Objective   Blood pressure (!) 144/96, pulse 100, temperature 98.9 F (37.2 C), temperature source Oral, resp. rate (!) 26, height 5\' 2"  (1.575 m), weight 122.7 kg, SpO2 97 %.    Vent Mode: PRVC FiO2 (%):  [40 %] 40 % Set Rate:  [26 bmp] 26 bmp Vt Set:  [400 mL] 400 mL PEEP:  [8 cmH20] 8 cmH20 Plateau Pressure:  [18  cmH20-26 cmH20] 26 cmH20   Intake/Output Summary (Last 24 hours) at 10/13/2021 7782 Last data filed at 10/13/2021 0800 Gross per 24 hour  Intake 3175.21 ml  Output 4300 ml  Net -1124.79 ml   Filed Weights   10/10/21 0500 10/11/21 0500 10/12/21 0500  Weight: 130.6 kg 125.3 kg 122.7 kg    Examination: Gen:   Acutely ill-appearing, HEENT: Endotracheal tube in place, moist oral mucosa Lungs:   Minimal wheezing CV:         S1-S2 appreciated Abd:      Bowel sounds appreciated Ext:    No edema, no clubbing Skin:      Warm and dry; no rashes Neuro:   Sedated   Labs/imaging reviewed: Chest x-ray 12/27 right upper lobe atelectasis improved-reviewed by myself  Resolved Hospital Problem list   Hypophosphatemia Hypokalemia Hypertensive emergency Circulatory shock  Assessment & Plan:   Acute on chronic hypoxic and hypercapnic respiratory failure Recent influenza A respiratory infection Acute severe exacerbation of asthma Mucous plugging -Chest x-ray improved -Wean sedation as tolerated -Still with significant secretions, may not wean off the vent successfully yet  Acute pulmonary edema -On Lasix -Continue to monitor renal parameters -Creatinine is stable, slight increase in BUN-we will monitor -Hold Lasix today -Will hold home antihypertensives  Acute kidney injury -Avoid nephrotoxic medications -Monitor and replete electrolytes  Hypernatremia -Monitor -Free water  Hypokalemia -Being repleted  Diabetes mellitus Steroid-induced hyperglycemia -Continue SSI  Normocytic anemia -No evidence of acute bleeding -Trend CBC  Metabolic encephalopathy -Wean sedation -Continue Seroquel -Will try to discontinue ketamine today  Not  ready for extubation -Will continue to evaluate  Still with significant critical care needs Updated spouse at bedside  Wean off ketamine We will try to wean off propofol as well  Best Practice (right click and "Reselect all SmartList  Selections" daily)   Diet/type: tubefeeds DVT prophylaxis: prophylactic heparin  GI prophylaxis: PPI Lines: N/A Foley:  Yes, and it is still needed Code Status:  full code Last date of multidisciplinary goals of care discussion [husband updated at the bedside 12/26]  Labs   CBC: Recent Labs  Lab 10/06/21 1017 10/06/21 1104 10/09/21 0530 10/09/21 0916 10/10/21 0800 10/11/21 0446 10/12/21 0411 10/12/21 0440 10/13/21 0412  WBC 17.6*   < > 11.2*  --  14.3* 14.6*  --  13.9* 11.3*  NEUTROABS 15.6*  --   --   --   --   --   --   --   --   HGB 11.7*   < > 9.6*   < > 9.7* 10.3* 11.2* 10.1* 10.0*  HCT 37.6   < > 30.1*   < > 29.6* 30.6* 33.0* 30.2* 30.8*  MCV 89.3   < > 87.2  --  84.6 83.2  --  83.4 85.1  PLT 328   < > 225  --  261 283  --  294 343   < > = values in this interval not displayed.    Basic Metabolic Panel: Recent Labs  Lab 10/07/21 0520 10/07/21 1209 10/07/21 1650 10/08/21 0015 10/08/21 0120 10/08/21 0419 10/08/21 2119 10/09/21 0452 10/09/21 2322 10/10/21 0404 10/10/21 0800 10/11/21 0446 10/12/21 0411 10/12/21 0440 10/12/21 0942 10/13/21 0412  NA 134* 138  --    < > 136   < >  --    < > 137   < > 137 141 141 141  --  147*  K 3.6 3.6  --    < > 3.8   < >  --    < > 4.4   < > 4.0 4.3 3.4* 3.2* 3.6 3.0*  CL 105 106  --   --  103  --   --    < > 106  --  104 102  --  100  --  102  CO2 21* 22  --   --  23  --   --    < > 22  --  23 28  --  29  --  28  GLUCOSE 180* 174*  --   --  282*  --   --    < > 191*  --  146* 134*  --  143*  --  91  BUN 11 12  --   --  19  --   --    < > 50*  --  53* 64*  --  76*  --  87*  CREATININE 0.96 1.02*  --   --  1.84*  --   --    < > 2.07*  --  2.08* 1.99*  --  2.01*  --  1.90*  CALCIUM 8.1* 8.2*  --   --  8.1*  --   --    < > 8.0*  --  8.2* 8.7*  --  8.8*  --  9.0  MG 2.5* 2.1 2.3  --  2.6*  --  2.8*  --   --   --   --   --   --   --   --   --  PHOS <1.0* 3.2 4.3  --  7.0*  --  6.4*  --   --   --   --   --   --   --   --   --     < > = values in this interval not displayed.   GFR: Estimated Creatinine Clearance: 43.3 mL/min (A) (by C-G formula based on SCr of 1.9 mg/dL (H)). Recent Labs  Lab 10/06/21 0834 10/06/21 1017 10/06/21 1100 10/07/21 0520 10/08/21 0320 10/08/21 1340 10/09/21 0530 10/10/21 0800 10/11/21 0446 10/12/21 0440 10/13/21 0412  PROCALCITON  --  <0.10  --   --   --   --   --   --   --   --   --   WBC  --  17.6*  --    < >  --   --    < > 14.3* 14.6* 13.9* 11.3*  LATICACIDVEN 1.7  --  2.8*  --  4.8* 1.7  --   --   --   --   --    < > = values in this interval not displayed.    Liver Function Tests: No results for input(s): AST, ALT, ALKPHOS, BILITOT, PROT, ALBUMIN in the last 168 hours.  No results for input(s): LIPASE, AMYLASE in the last 168 hours. No results for input(s): AMMONIA in the last 168 hours.  ABG    Component Value Date/Time   PHART 7.495 (H) 10/12/2021 0411   PCO2ART 40.7 10/12/2021 0411   PO2ART 77 (L) 10/12/2021 0411   HCO3 31.2 (H) 10/12/2021 0411   TCO2 32 10/12/2021 0411   ACIDBASEDEF 1.0 10/10/2021 0404   O2SAT 96.0 10/12/2021 0411     Coagulation Profile: Recent Labs  Lab 10/06/21 1017  INR 1.0    Cardiac Enzymes: No results for input(s): CKTOTAL, CKMB, CKMBINDEX, TROPONINI in the last 168 hours.  HbA1C: Hgb A1c MFr Bld  Date/Time Value Ref Range Status  10/06/2021 01:16 PM 9.9 (H) 4.8 - 5.6 % Final    Comment:    (NOTE) Pre diabetes:          5.7%-6.4%  Diabetes:              >6.4%  Glycemic control for   <7.0% adults with diabetes     CBG: Recent Labs  Lab 10/12/21 1526 10/12/21 1924 10/12/21 2319 10/13/21 0315 10/13/21 0732  GLUCAP 295* 292* 212* 112* 96   The patient is critically ill with multiple organ systems failure and requires high complexity decision making for assessment and support, frequent evaluation and titration of therapies, application of advanced monitoring technologies and extensive interpretation of  multiple databases. Critical Care Time devoted to patient care services described in this note independent of APP/resident time (if applicable)  is 32 minutes.   Sherrilyn Rist MD [facility] Personal pager: See Amion If unanswered, please page CCM On-call: [phone]

## 2021-10-14 LAB — CBC
HCT: 32.5 % — ABNORMAL LOW (ref 36.0–46.0)
Hemoglobin: 10.5 g/dL — ABNORMAL LOW (ref 12.0–15.0)
MCH: 27.7 pg (ref 26.0–34.0)
MCHC: 32.3 g/dL (ref 30.0–36.0)
MCV: 85.8 fL (ref 80.0–100.0)
Platelets: 349 10*3/uL (ref 150–400)
RBC: 3.79 MIL/uL — ABNORMAL LOW (ref 3.87–5.11)
RDW: 16.2 % — ABNORMAL HIGH (ref 11.5–15.5)
WBC: 9.3 10*3/uL (ref 4.0–10.5)
nRBC: 0.2 % (ref 0.0–0.2)

## 2021-10-14 LAB — GLUCOSE, CAPILLARY
Glucose-Capillary: 185 mg/dL — ABNORMAL HIGH (ref 70–99)
Glucose-Capillary: 245 mg/dL — ABNORMAL HIGH (ref 70–99)
Glucose-Capillary: 353 mg/dL — ABNORMAL HIGH (ref 70–99)
Glucose-Capillary: 283 mg/dL — ABNORMAL HIGH (ref 70–99)
Glucose-Capillary: 186 mg/dL — ABNORMAL HIGH (ref 70–99)
Glucose-Capillary: 120 mg/dL — ABNORMAL HIGH (ref 70–99)
Glucose-Capillary: 174 mg/dL — ABNORMAL HIGH (ref 70–99)

## 2021-10-14 LAB — BASIC METABOLIC PANEL
Anion gap: 11 (ref 5–15)
BUN: 84 mg/dL — ABNORMAL HIGH (ref 6–20)
CO2: 27 mmol/L (ref 22–32)
Calcium: 9.2 mg/dL (ref 8.9–10.3)
Chloride: 105 mmol/L (ref 98–111)
Creatinine, Ser: 1.66 mg/dL — ABNORMAL HIGH (ref 0.44–1.00)
GFR, Estimated: 37 mL/min — ABNORMAL LOW (ref >60)
Glucose, Bld: 230 mg/dL — ABNORMAL HIGH (ref 70–99)
Potassium: 3.2 mmol/L — ABNORMAL LOW (ref 3.5–5.1)
Sodium: 143 mmol/L (ref 135–145)

## 2021-10-14 LAB — TRIGLYCERIDES: Triglycerides: 395 mg/dL — ABNORMAL HIGH (ref <150)

## 2021-10-14 MED ORDER — ORAL CARE MOUTH RINSE
15.0000 mL | Freq: Two times a day (BID) | OROMUCOSAL | Status: DC
  Administered 2021-10-14 – 2021-10-19 (×7): 15 mL via OROMUCOSAL

## 2021-10-14 MED ORDER — LABETALOL HCL 5 MG/ML IV SOLN: 20.0000 mg | Freq: Once | INTRAVENOUS | Status: DC

## 2021-10-14 MED ORDER — INSULIN ASPART 100 UNIT/ML IJ SOLN
6.0000 [IU] | INTRAMUSCULAR | Status: DC
  Administered 2021-10-14: 11:00:00 6 [IU] via SUBCUTANEOUS

## 2021-10-14 MED ORDER — GLUCERNA SHAKE PO LIQD
237.0000 mL | Freq: Three times a day (TID) | ORAL | Status: DC
  Administered 2021-10-14 – 2021-10-19 (×12): 237 mL via ORAL

## 2021-10-14 MED ORDER — OXYCODONE HCL 5 MG PO TABS
5.0000 mg | ORAL_TABLET | Freq: Four times a day (QID) | ORAL | Status: DC | PRN
  Administered 2021-10-14: 21:00:00 5 mg
  Filled 2021-10-14: qty 1

## 2021-10-14 MED ORDER — INSULIN DETEMIR 100 UNIT/ML ~~LOC~~ SOLN
12.0000 [IU] | Freq: Two times a day (BID) | SUBCUTANEOUS | Status: DC
Start: 2021-10-14 — End: 2021-10-17
  Administered 2021-10-14 – 2021-10-17 (×6): 12 [IU] via SUBCUTANEOUS
  Filled 2021-10-14 (×7): qty 0.12

## 2021-10-14 MED ORDER — POTASSIUM CHLORIDE CRYS ER 20 MEQ PO TBCR
20.0000 meq | EXTENDED_RELEASE_TABLET | Freq: Once | ORAL | Status: AC
  Administered 2021-10-14: 21:00:00 20 meq via ORAL
  Filled 2021-10-14: qty 1

## 2021-10-14 NOTE — Progress Notes (Signed)
[facility] of  NAME:  Sara Chan, MRN:  [id number], DOB:  1971-08-28, LOS: 8 ADMISSION DATE:  10/06/2021, CONSULTATION DATE:  10/06/2021 REFERRING MD:  EDP, CHIEF COMPLAINT:  AHRF, Hypertensive emergency   History of Present Illness:  50 year old female who is known to have flu approximately 2 months ago and has never returned to her normal pulmonary baseline.  Patient has been experiencing increasing shortness of breath of the last 2 months without fevers chills or sweats.  Presnted to ED. Initially treated with steroids and bronchodilators.  Was going to have CT Chest for further evaluation but became more hypoxemic and hypercapnic reqiuiring bipap and then intubation after receiving some sedation for scan. An arterial line was placed and her BP was over 300 SBP. Nitro drip started.  Elevate legs  Pertinent  Medical History   Past Medical History:  Diagnosis Date   Arthritis    Diabetes mellitus without complication ([place])    Hypertension    Significant Hospital Events: Including procedures, antibiotic start and stop dates in addition to other pertinent events   12/21 presented to the ED with respiratory concerns, initially requiring biPAP but then intubated after receiving sedation for scan; noted to be in hypertensive emergency. PCCM consulted for admission to ICU 12/23 NMB initaited for hypercapnia, hypoxemia, wheezing concerning for severe asthma 12/25 NMB discontinued, ABGs and bronchospasm improved 12/28 still with significant secretions overnight 12/29 less sedated, following commands no overnight events  Interim History / Subjective:   Chest x-ray did improve relating to concern for mucous plugging No other significant overnight events generally  Objective   Blood pressure 116/74, pulse 94, temperature 98.1 F (36.7 C), temperature source Oral, resp. rate (!) 26, height 5\' 2"  (1.575 m), weight 122.7 kg, SpO2 96 %.    Vent Mode: PRVC FiO2 (%):  [40 %] 40 % Set Rate:   [26 bmp] 26 bmp Vt Set:  [400 mL] 400 mL PEEP:  [5 cmH20] 5 cmH20 Plateau Pressure:  [22 cmH20-26 cmH20] 26 cmH20   Intake/Output Summary (Last 24 hours) at 10/14/2021 0946 Last data filed at 10/14/2021 0900 Gross per 24 hour  Intake 3032.35 ml  Output 2975 ml  Net 57.35 ml   Filed Weights   10/10/21 0500 10/11/21 0500 10/12/21 0500  Weight: 130.6 kg 125.3 kg 122.7 kg    Examination: Gen:   Acutely ill-appearing HEENT: Endotracheal tube in place Lungs:   Minimal wheezing this morning CV:         S1-S2 appreciated Abd:      Bowel sounds appreciated Ext:    No edema, no clubbing Skin:      Warm and dry; no rashes Neuro:   Sedated   Labs/imaging reviewed: Chest x-ray 12/27 right upper lobe atelectasis improved-reviewed by myself Tolerating 5/5 pressure support  Resolved Hospital Problem list   Hypophosphatemia Hypokalemia Hypertensive emergency Circulatory shock  Assessment & Plan:   Acute on chronic hypoxic and hypercapnic respiratory failure Recent influenza A respiratory flexion Acute severe exacerbation of asthma Mucous plugging -Chest x-ray improved -Wean sedation as tolerated  Acute pulmonary edema -Lasix held. -Home antihypertensives on hold  Acute kidney injury -Avoid nephrotoxic medications -Monitor and replete electrolytes  Hyponatremia -We will continue to monitor closely -Free water as needed  Diabetes -Continue SSI  Normocytic anemia -Trend CBC  Metabolic encephalopathy -We will continue to wean sedation -Continue Seroquel  Plan will be to extubate today Tolerating weaning well -Needing as needed medications for high blood pressure  Best Practice (right  click and "Reselect all SmartList Selections" daily)   Diet/type: tubefeeds DVT prophylaxis: prophylactic heparin  GI prophylaxis: PPI Lines: N/A Foley:  Yes, and it is still needed Code Status:  full code Last date of multidisciplinary goals of care discussion [husband updated  at the bedside 12/26]  Labs   CBC: Recent Labs  Lab 10/10/21 0800 10/11/21 0446 10/12/21 0411 10/12/21 0440 10/13/21 0412 10/14/21 0535  WBC 14.3* 14.6*  --  13.9* 11.3* 9.3  HGB 9.7* 10.3* 11.2* 10.1* 10.0* 10.5*  HCT 29.6* 30.6* 33.0* 30.2* 30.8* 32.5*  MCV 84.6 83.2  --  83.4 85.1 85.8  PLT 261 283  --  294 343 979    Basic Metabolic Panel: Recent Labs  Lab 10/07/21 1209 10/07/21 1650 10/08/21 0015 10/08/21 0120 10/08/21 0419 10/08/21 2119 10/09/21 0452 10/10/21 0800 10/11/21 0446 10/12/21 0411 10/12/21 0440 10/12/21 0942 10/13/21 0412 10/14/21 0535  NA 138  --    < > 136   < >  --    < > 137 141 141 141  --  147* 143  K 3.6  --    < > 3.8   < >  --    < > 4.0 4.3 3.4* 3.2* 3.6 3.0* 3.2*  CL 106  --   --  103  --   --    < > 104 102  --  100  --  102 105  CO2 22  --   --  23  --   --    < > 23 28  --  29  --  28 27  GLUCOSE 174*  --   --  282*  --   --    < > 146* 134*  --  143*  --  91 230*  BUN 12  --   --  19  --   --    < > 53* 64*  --  76*  --  87* 84*  CREATININE 1.02*  --   --  1.84*  --   --    < > 2.08* 1.99*  --  2.01*  --  1.90* 1.66*  CALCIUM 8.2*  --   --  8.1*  --   --    < > 8.2* 8.7*  --  8.8*  --  9.0 9.2  MG 2.1 2.3  --  2.6*  --  2.8*  --   --   --   --   --   --   --   --   PHOS 3.2 4.3  --  7.0*  --  6.4*  --   --   --   --   --   --   --   --    < > = values in this interval not displayed.   GFR: Estimated Creatinine Clearance: 49.5 mL/min (A) (by C-G formula based on SCr of 1.66 mg/dL (H)). Recent Labs  Lab 10/08/21 0320 10/08/21 1340 10/09/21 0530 10/11/21 0446 10/12/21 0440 10/13/21 0412 10/14/21 0535  WBC  --   --    < > 14.6* 13.9* 11.3* 9.3  LATICACIDVEN 4.8* 1.7  --   --   --   --   --    < > = values in this interval not displayed.    Liver Function Tests: No results for input(s): AST, ALT, ALKPHOS, BILITOT, PROT, ALBUMIN in the last 168 hours.  No results for input(s): LIPASE, AMYLASE in the last 168 hours. No  results for input(s): AMMONIA in the last 168 hours.  ABG    Component Value Date/Time   PHART 7.495 (H) 10/12/2021 0411   PCO2ART 40.7 10/12/2021 0411   PO2ART 77 (L) 10/12/2021 0411   HCO3 31.2 (H) 10/12/2021 0411   TCO2 32 10/12/2021 0411   ACIDBASEDEF 1.0 10/10/2021 0404   O2SAT 96.0 10/12/2021 0411     Coagulation Profile: No results for input(s): INR, PROTIME in the last 168 hours.   Cardiac Enzymes: No results for input(s): CKTOTAL, CKMB, CKMBINDEX, TROPONINI in the last 168 hours.  HbA1C: Hgb A1c MFr Bld  Date/Time Value Ref Range Status  10/06/2021 01:16 PM 9.9 (H) 4.8 - 5.6 % Final    Comment:    (NOTE) Pre diabetes:          5.7%-6.4%  Diabetes:              >6.4%  Glycemic control for   <7.0% adults with diabetes     CBG: Recent Labs  Lab 10/13/21 1528 10/13/21 1935 10/13/21 2323 10/14/21 0323 10/14/21 0721  GLUCAP 173* 160* 174* 185* 245*   The patient is critically ill with multiple organ systems failure and requires high complexity decision making for assessment and support, frequent evaluation and titration of therapies, application of advanced monitoring technologies and extensive interpretation of multiple databases. Critical Care Time devoted to patient care services described in this note independent of APP/resident time (if applicable)  is 32 minutes.   Sherrilyn Rist MD [facility] Personal pager: See Amion If unanswered, please page CCM On-call: [phone]

## 2021-10-14 NOTE — Progress Notes (Signed)
[facility] Progress Note Patient Name: Temprence Rhines DOB: 08-May-1971 MRN: [id number]   Date of Service  10/14/2021  HPI/Events of Note  K 3.2 at 0535, not repleted today.  Alert but lethargic per RN,   eICU Interventions  Uses home CPAP - resume Hypokalemia -repleted  Dc seroquel Change oxy to prn     Intervention Category Major Interventions: Other:  Leanna Sato Hashir Deleeuw 10/14/2021, 8:26 PM

## 2021-10-14 NOTE — Procedures (Signed)
Extubation Procedure Note  Patient Details:   Name: Sara Chan DOB: 12-Feb-1971 MRN: [id number]   Airway Documentation:  Airway 7.5 mm (Active)  Secured at (cm) 24 cm 10/14/21 0800  Measured From Lips 10/14/21 0800  [place] 10/14/21 0744  Secured By [employer] 10/14/21 0744  Tube Holder Repositioned Yes 10/14/21 0744  Prone position No 10/14/21 0744  Cuff Pressure (cm H2O) Clear OR 27-39 CmH2O 10/14/21 0347  Site Condition Dry 10/14/21 0800   Vent end date: (not recorded) Vent end time: (not recorded)   Evaluation  O2 sats: stable throughout Complications: No apparent complications Patient did tolerate procedure well. Bilateral Breath Sounds: Expiratory wheezes, Diminished   Yes  Mcneil Sober 10/14/2021, 11:54 AM

## 2021-10-14 NOTE — Progress Notes (Signed)
Placed patient on CPAP for the night via auto-mode with oxygen set at 2lpm  

## 2021-10-14 NOTE — Progress Notes (Signed)
Nutrition Follow-up  DOCUMENTATION CODES:   Morbid obesity  INTERVENTION:  -recommend transition to SSI meal coverage now that pt on PO diet -Glucerna Shake po TID, each supplement provides 220 kcal and 10 grams of protein given elevated CBGs -continue MVI with minerals daily  NUTRITION DIAGNOSIS:   Inadequate oral intake related to inability to eat as evidenced by NPO status.  Progressing; pt on PO diet  GOAL:   Patient will meet greater than or equal to 90% of their needs  progressing  MONITOR:   PO intake, Supplement acceptance, Labs, Weight trends, I & O's  REASON FOR ASSESSMENT:   Ventilator, Consult Enteral/tube feeding initiation and management  ASSESSMENT:   50 yo female admitted with acute respiratory failure, hypertensive emergency. Intubated on admission. PMH includes flu 2 months PTA, arthritis, DM, HTN.  12/21 - intubated 12/29 - extubated; OGT removed   Pt discussed during ICU rounds and with RN. Pt being weaned from ventilator at time of RD visit. Pt was successfully extubated today (12/29) and placed on Heart Healthy/Carb Modified diet. No PO documented at this time. RD to provide oral nutrition supplements for pt. Note pt's CBGs are significantly elevated; Diabetes Coordinator following. Pt is currently on SSI Q4H; recommend transition to meal coverage now that diet has advanced.   UOP: 2943ml x24 hours I/O: -3436ml since admit  Current weight: 122.7 kg Admit weight: 125.6 kg   nonpitting generalized edema per RN assessment  Medications: SSI Q4H, 12 units levemir BID, mvi with minerals, protonix Labs: Recent Labs  Lab 10/07/21 1650 10/08/21 0015 10/08/21 0120 10/08/21 0419 10/08/21 2119 10/09/21 0452 10/12/21 0440 10/12/21 0942 10/13/21 0412 10/14/21 0535  NA  --    < > 136   < >  --    < > 141  --  147* 143  K  --    < > 3.8   < >  --    < > 3.2* 3.6 3.0* 3.2*  CL  --   --  103  --   --    < > 100  --  102 105  CO2  --   --  23  --    --    < > 29  --  28 27  BUN  --   --  19  --   --    < > 76*  --  87* 84*  CREATININE  --   --  1.84*  --   --    < > 2.01*  --  1.90* 1.66*  CALCIUM  --   --  8.1*  --   --    < > 8.8*  --  9.0 9.2  MG 2.3  --  2.6*  --  2.8*  --   --   --   --   --   PHOS 4.3  --  7.0*  --  6.4*  --   --   --   --   --   GLUCOSE  --   --  282*  --   --    < > 143*  --  91 230*   < > = values in this interval not displayed.  CBGs: 160-353 x24 hours  Diet Order:   Diet Order             Diet heart healthy/carb modified Room service appropriate? Yes; Fluid consistency: Thin  Diet effective now  EDUCATION NEEDS:   Not appropriate for education at this time  Skin:  Skin Assessment: Reviewed RN Assessment  Last BM:  12/29 type 7  Height:   Ht Readings from Last 1 Encounters:  10/06/21 5\' 2"  (1.575 m)    Weight:   Wt Readings from Last 1 Encounters:  10/12/21 122.7 kg   BMI:  Body mass index is 49.48 kg/m.  Estimated Nutritional Needs:   Kcal:  1800-2000  Protein:  120-130 gm  Fluid:  >/= 2 L     Mazella Deen A., MS, RD, LDN (she/her/hers) RD pager number and weekend/on-call pager number located in [place].

## 2021-10-14 NOTE — Progress Notes (Signed)
Inpatient Diabetes Program Recommendations  AACE/ADA: New Consensus Statement on Inpatient Glycemic Control (2015)  Target Ranges:  Prepandial:   less than 140 mg/dL      Peak postprandial:   less than 180 mg/dL (1-2 hours)      Critically ill patients:  140 - 180 mg/dL   Lab Results  Component Value Date   GLUCAP 245 (H) 10/14/2021   HGBA1C 9.9 (H) 10/06/2021    Review of Glycemic Control  Latest Reference Range & Units 10/13/21 19:35 10/13/21 23:23 10/14/21 03:23  Glucose-Capillary 70 - 99 mg/dL 160 (H) 174 (H) 185 (H)  (H): Data is abnormally high Diabetes history: DM 2 Outpatient Diabetes medications:  Invokamet 50-500 mg daily Current orders for Inpatient glycemic control:  Levemir 10 units Bid, Novolog 4 units Q4H, Novolog 0-20 units Q4H Vital increased to 70 ml/hr    Inpatient Diabetes Program Recommendations:     Consider: -Changing Levemir back to 15 units bid -Increasing tube feed coverage to 6 units Q4H (to be stopped or held in the event tube feeds stopped)  Thanks, Bronson Curb, MSN, RNC-OB Diabetes Coordinator [phone] (8a-5p)

## 2021-10-15 LAB — GLUCOSE, CAPILLARY
Glucose-Capillary: 140 mg/dL — ABNORMAL HIGH (ref 70–99)
Glucose-Capillary: 132 mg/dL — ABNORMAL HIGH (ref 70–99)
Glucose-Capillary: 148 mg/dL — ABNORMAL HIGH (ref 70–99)
Glucose-Capillary: 147 mg/dL — ABNORMAL HIGH (ref 70–99)
Glucose-Capillary: 107 mg/dL — ABNORMAL HIGH (ref 70–99)
Glucose-Capillary: 139 mg/dL — ABNORMAL HIGH (ref 70–99)

## 2021-10-15 LAB — BASIC METABOLIC PANEL
Anion gap: 10 (ref 5–15)
BUN: 69 mg/dL — ABNORMAL HIGH (ref 6–20)
CO2: 27 mmol/L (ref 22–32)
Calcium: 9.1 mg/dL (ref 8.9–10.3)
Chloride: 102 mmol/L (ref 98–111)
Creatinine, Ser: 1.26 mg/dL — ABNORMAL HIGH (ref 0.44–1.00)
GFR, Estimated: 51 mL/min — ABNORMAL LOW (ref >60)
Glucose, Bld: 162 mg/dL — ABNORMAL HIGH (ref 70–99)
Potassium: 3.3 mmol/L — ABNORMAL LOW (ref 3.5–5.1)
Sodium: 139 mmol/L (ref 135–145)

## 2021-10-15 LAB — CBC
HCT: 33.4 % — ABNORMAL LOW (ref 36.0–46.0)
Hemoglobin: 10.4 g/dL — ABNORMAL LOW (ref 12.0–15.0)
MCH: 27.5 pg (ref 26.0–34.0)
MCHC: 31.1 g/dL (ref 30.0–36.0)
MCV: 88.4 fL (ref 80.0–100.0)
Platelets: 346 10*3/uL (ref 150–400)
RBC: 3.78 MIL/uL — ABNORMAL LOW (ref 3.87–5.11)
RDW: 16.3 % — ABNORMAL HIGH (ref 11.5–15.5)
WBC: 11.1 10*3/uL — ABNORMAL HIGH (ref 4.0–10.5)
nRBC: 0.3 % — ABNORMAL HIGH (ref 0.0–0.2)

## 2021-10-15 MED ORDER — POTASSIUM CHLORIDE 20 MEQ PO PACK
20.0000 meq | PACK | ORAL | Status: DC
  Administered 2021-10-15: 06:00:00 20 meq
  Filled 2021-10-15: qty 1

## 2021-10-15 MED ORDER — POLYETHYLENE GLYCOL 3350 17 G PO PACK: 17.0000 g | PACK | Freq: Every day | ORAL | Status: DC | PRN

## 2021-10-15 MED ORDER — LOSARTAN POTASSIUM 50 MG PO TABS
100.0000 mg | ORAL_TABLET | Freq: Every day | ORAL | Status: DC
  Administered 2021-10-15 – 2021-10-17 (×3): 100 mg via ORAL
  Filled 2021-10-15 (×3): qty 2

## 2021-10-15 MED ORDER — AMLODIPINE BESYLATE 5 MG PO TABS
5.0000 mg | ORAL_TABLET | Freq: Two times a day (BID) | ORAL | Status: DC
  Administered 2021-10-15 – 2021-10-18 (×7): 5 mg via ORAL
  Filled 2021-10-15 (×7): qty 1

## 2021-10-15 MED ORDER — PANTOPRAZOLE SODIUM 40 MG PO TBEC: 40.0000 mg | DELAYED_RELEASE_TABLET | Freq: Every day | ORAL | Status: DC

## 2021-10-15 MED ORDER — DOCUSATE SODIUM 100 MG PO CAPS: 100.0000 mg | ORAL_CAPSULE | Freq: Two times a day (BID) | ORAL | Status: DC | PRN

## 2021-10-15 MED ORDER — ADULT MULTIVITAMIN W/MINERALS CH
1.0000 | ORAL_TABLET | Freq: Every day | ORAL | Status: DC
Start: 2021-10-15 — End: 2021-10-19
  Administered 2021-10-15 – 2021-10-19 (×5): 1 via ORAL
  Filled 2021-10-15 (×5): qty 1

## 2021-10-15 MED ORDER — LOSARTAN POTASSIUM-HCTZ 100-25 MG PO TABS: 1.0000 | ORAL_TABLET | Freq: Every day | ORAL | Status: DC

## 2021-10-15 MED ORDER — POTASSIUM CHLORIDE 20 MEQ PO PACK
20.0000 meq | PACK | ORAL | Status: AC
  Administered 2021-10-15: 11:00:00 20 meq via ORAL
  Filled 2021-10-15: qty 1

## 2021-10-15 MED ORDER — POTASSIUM CHLORIDE 10 MEQ/50ML IV SOLN
10.0000 meq | INTRAVENOUS | Status: AC
  Administered 2021-10-15 (×4): 10 meq via INTRAVENOUS
  Filled 2021-10-15 (×4): qty 50

## 2021-10-15 MED ORDER — OXYCODONE HCL 5 MG PO TABS
5.0000 mg | ORAL_TABLET | Freq: Four times a day (QID) | ORAL | Status: DC | PRN
  Administered 2021-10-17: 5 mg via ORAL
  Filled 2021-10-15: qty 1

## 2021-10-15 MED ORDER — HYDROCHLOROTHIAZIDE 25 MG PO TABS
25.0000 mg | ORAL_TABLET | Freq: Every day | ORAL | Status: DC
  Administered 2021-10-15 – 2021-10-17 (×3): 25 mg via ORAL
  Filled 2021-10-15 (×3): qty 1

## 2021-10-15 MED ORDER — ACETAMINOPHEN 325 MG PO TABS: 650.0000 mg | ORAL_TABLET | ORAL | Status: DC | PRN

## 2021-10-15 NOTE — Evaluation (Signed)
Physical Therapy Evaluation Patient Details Name: Sara Chan MRN: [id number] DOB: 07-Aug-1971 Today's Date: 10/15/2021  History of Present Illness  50 yo admitted 12/21 with respiratory difficulty who received sedation for CT scan and required intubation after also with HTN urgency. Extubated 12/29. PMhx: flu in Oct 2022 with pt not recovering to pulmonary baseline, DM, HTN, arthritis  Clinical Impression  Pt pleasant and attempting to use cell phone on arrival but consistently dropping it and unable to control phone or buttons. Pt with noted paralytic during admission with pt demonstrating bil UE weakness with lack of fine motor control as well as impaired balance, bil LE weakness and decreased transfers. Pt was independent PTA and has support of family. Pt will benefit from acute therapy to maximize mobility, safety and function to decrease burden of care. Pt would greatly benefit from CIR to return to PLOF.   94% on RA       Recommendations for follow up therapy are one component of a multi-disciplinary discharge planning process, led by the attending physician.  Recommendations may be updated based on patient status, additional functional criteria and insurance authorization.  Follow Up Recommendations Acute inpatient rehab (3hours/day)    Assistance Recommended at Discharge Frequent or constant Supervision/Assistance  Functional Status Assessment Patient has had a recent decline in their functional status and demonstrates the ability to make significant improvements in function in a reasonable and predictable amount of time.  Equipment Recommendations  BSC/3in1;Rolling walker (2 wheels)    Recommendations for Other Services OT consult;Rehab consult     Precautions / Restrictions Precautions Precautions: Fall Precaution Comments: bil UE ataxia      Mobility  Bed Mobility Overal bed mobility: Needs Assistance Bed Mobility: Supine to Sit     Supine to sit: Mod assist;HOB  elevated     General bed mobility comments: HOB 30 degrees with mod assist to pivot legs and pelvis to EOB with increased time    Transfers Overall transfer level: Needs assistance   Transfers: Sit to/from Stand;Bed to chair/wheelchair/BSC Sit to Stand: Min assist Stand pivot transfers: Mod assist         General transfer comment: min assist to stand from bed x 2 with left knee blocked and cues for sequence. Physical assist to complete pivot from bed to chair toward right with decreased ability to step and achieve fully upright    Ambulation/Gait               General Gait Details: not safe to attempt without +2 assist  Stairs            Wheelchair Mobility    Modified Rankin (Stroke Patients Only)       Balance Overall balance assessment: Needs assistance   Sitting balance-Leahy Scale: Fair Sitting balance - Comments: pt able to static sit with minguard with weight shifting pt with LOB right and left with assist to correct   Standing balance support: Single extremity supported Standing balance-Leahy Scale: Poor Standing balance comment: physical assist for balance in standing                             Pertinent Vitals/Pain Pain Assessment: No/denies pain    Home Living Family/patient expects to be discharged to:: Private residence Living Arrangements: Spouse/significant other Available Help at Discharge: Family;Available 24 hours/day Type of Home: House Home Access: Stairs to enter   [employer] of Steps: 1   Home Layout: One level  Home Equipment: None      Prior Function Prior Level of Function : Independent/Modified Independent                     Hand Dominance        Extremity/Trunk Assessment   Upper Extremity Assessment Upper Extremity Assessment: RUE deficits/detail;LUE deficits/detail RUE Deficits / Details: decreased control of fine motor movement LUE Deficits / Details: decreased control of  fine motor movement    Lower Extremity Assessment Lower Extremity Assessment: RLE deficits/detail;LLE deficits/detail RLE Deficits / Details: knee flexion 3/5, knee extension 4/5, hip flexion 4/5 LLE Deficits / Details: knee flexion 3/5, knee extension 4/5, hip flexion 4/5    Cervical / Trunk Assessment Cervical / Trunk Assessment: Other exceptions Cervical / Trunk Exceptions: increased lumbar lordosis due to body habitus  Communication   Communication: No difficulties  Cognition Arousal/Alertness: Awake/alert Behavior During Therapy: WFL for tasks assessed/performed Overall Cognitive Status: Within Functional Limits for tasks assessed                                          General Comments      Exercises     Assessment/Plan    PT Assessment Patient needs continued PT services  PT Problem List Decreased strength;Decreased mobility;Decreased activity tolerance;Decreased balance;Decreased knowledge of use of DME;Decreased coordination;Obesity       PT Treatment Interventions Gait training;Balance training;DME instruction;Therapeutic exercise;Functional mobility training;Therapeutic activities;Patient/family education;Neuromuscular re-education    PT Goals (Current goals can be found in the Care Plan section)  Acute Rehab PT Goals Patient Stated Goal: return to home and cooking PT Goal Formulation: With patient/family Time For Goal Achievement: 10/29/21 Potential to Achieve Goals: Good    Frequency Min 3X/week   Barriers to discharge Decreased caregiver support      Co-evaluation               AM-PAC PT "6 Clicks" Mobility  Outcome Measure Help needed turning from your back to your side while in a flat bed without using bedrails?: A Little Help needed moving from lying on your back to sitting on the side of a flat bed without using bedrails?: A Lot Help needed moving to and from a bed to a chair (including a wheelchair)?: A Lot Help needed  standing up from a chair using your arms (e.g., wheelchair or bedside chair)?: A Little Help needed to walk in hospital room?: Total Help needed climbing 3-5 steps with a railing? : Total 6 Click Score: 12    End of Session Equipment Utilized During Treatment: Gait belt Activity Tolerance: Patient tolerated treatment well Patient left: in chair;with call bell/phone within reach;with chair alarm set Nurse Communication: Mobility status PT Visit Diagnosis: Other abnormalities of gait and mobility (R26.89);Difficulty in walking, not elsewhere classified (R26.2);Muscle weakness (generalized) (M62.81);Other symptoms and signs involving the nervous system (R29.898)    Time: 3664-4034 PT Time Calculation (min) (ACUTE ONLY): 31 min   Charges:   PT Evaluation $PT Eval Moderate Complexity: 1 Mod PT Treatments $Therapeutic Activity: 8-22 mins        Erma Joubert P, PT Acute Rehabilitation Services Pager: [phone] Office: [phone]   Sandy Salaam Emmauel Hallums 10/15/2021, 12:45 PM

## 2021-10-15 NOTE — Evaluation (Signed)
Occupational Therapy Evaluation Patient Details Name: Sara Chan MRN: [id number] DOB: 26-Sep-1971 Today's Date: 10/15/2021   History of Present Illness 50 yo admitted 12/21 with respiratory difficulty who received sedation for CT scan and required intubation after also with HTN urgency. Extubated 12/29. PMhx: flu in Oct 2022 with pt not recovering to pulmonary baseline, DM, HTN, arthritis   Clinical Impression   Pt admitted with above. She demonstrates the below listed deficits and will benefit from continued OT to maximize safety and independence with BADLs.  Pt presents to OT with generalized weakness, decreased activity tolerance, impaired bil. UE function, impaired cognition.  She currently requires max A for self feeding and grooming and total A for the remainder of her ADLs.  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  Recommend AIR at discharge.        Recommendations for follow up therapy are one component of a multi-disciplinary discharge planning process, led by the attending physician.  Recommendations may be updated based on patient status, additional functional criteria and insurance authorization.   Follow Up Recommendations  Acute inpatient rehab (3hours/day)    Assistance Recommended at Discharge    Functional Status Assessment  Patient has had a recent decline in their functional status and demonstrates the ability to make significant improvements in function in a reasonable and predictable amount of time.  Equipment Recommendations  None recommended by OT    Recommendations for Other Services Rehab consult     Precautions / Restrictions Precautions Precautions: Fall Precaution Comments: bil UE ataxia      Mobility Bed Mobility Overal bed mobility: Needs Assistance Bed Mobility: Supine to Sit     Supine to sit: Mod assist;HOB elevated     General bed mobility comments: pt sitting up in  recliner    Transfers Overall transfer level: Needs assistance   Transfers: Sit to/from Stand;Bed to chair/wheelchair/BSC Sit to Stand: Min assist Stand pivot transfers: Mod assist         General transfer comment: min assist to stand from bed x 2 with left knee blocked and cues for sequence. Physical assist to complete pivot from bed to chair toward right with decreased ability to step and achieve fully upright      Balance Overall balance assessment: Needs assistance   Sitting balance-Leahy Scale: Fair Sitting balance - Comments: pt able to static sit with minguard with weight shifting pt with LOB right and left with assist to correct   Standing balance support: Single extremity supported Standing balance-Leahy Scale: Poor Standing balance comment: physical assist for balance in standing                           ADL either performed or assessed with clinical judgement   ADL Overall ADL's : Needs assistance/impaired Eating/Feeding: Maximal assistance;Sitting Eating/Feeding Details (indicate cue type and reason): max A with Rt UE Grooming: Wash/dry hands;Wash/dry face;Maximal assistance;Sitting Grooming Details (indicate cue type and reason): with Rt UE Upper Body Bathing: Total assistance;Sitting   Lower Body Bathing: Total assistance;Bed level   Upper Body Dressing : Total assistance;Sitting   Lower Body Dressing: Total assistance;Bed level   Toilet Transfer: Maximal assistance;+2 for physical assistance;+2 for safety/equipment   Toileting- Clothing Manipulation and Hygiene: Total assistance;Bed level       Functional mobility during ADLs: Maximal assistance;+2 for physical assistance       Vision Patient Visual Report: No change  from baseline       Perception     Praxis      Pertinent Vitals/Pain Pain Assessment: No/denies pain     Hand Dominance     Extremity/Trunk Assessment Upper Extremity Assessment Upper Extremity Assessment: RUE  deficits/detail;LUE deficits/detail RUE Deficits / Details: shoulder 1/5; elbow 3/5, wrist and hand 3+/5-4-/5 RUE Coordination: decreased fine motor;decreased gross motor LUE Deficits / Details: shoulder 1/5; elbow 2-/5; wrist and hand 3/5 LUE Coordination: decreased fine motor;decreased gross motor   Lower Extremity Assessment Lower Extremity Assessment: Defer to PT evaluation RLE Deficits / Details: knee flexion 3/5, knee extension 4/5, hip flexion 4/5 LLE Deficits / Details: knee flexion 3/5, knee extension 4/5, hip flexion 4/5   Cervical / Trunk Assessment Cervical / Trunk Assessment: Other exceptions Cervical / Trunk Exceptions: increased lumbar lordosis due to body habitus   Communication Communication Communication: No difficulties   Cognition Arousal/Alertness: Awake/alert Behavior During Therapy: Flat affect Overall Cognitive Status: Impaired/Different from baseline Area of Impairment: Attention;Following commands;Awareness;Problem solving                   Current Attention Level: Sustained;Focused   Following Commands: Follows one step commands consistently (with cues due to being internally distracted)   Awareness: Intellectual Problem Solving: Slow processing;Requires verbal cues General Comments: Pt very distracted and required mod cues to attend to task.     General Comments  VSS .  Pt fatigues rapidly    Exercises Exercises: Other exercises Other Exercises Other Exercises: Pt performed 8 reps AAROM shoulder flex/ext, elbow flex/ext and scapula adduction with mod cues.  Fatigued rapidly   Shoulder Instructions      Home Living Family/patient expects to be discharged to:: Private residence Living Arrangements: Spouse/significant other Available Help at Discharge: Family;Available 24 hours/day Type of Home: House Home Access: Stairs to enter [employer] of Steps: 1   Home Layout: One level     Bathroom Shower/Tub: Animal nutritionist: Standard     Home Equipment: None          Prior Functioning/Environment Prior Level of Function : Independent/Modified Independent               ADLs Comments: works in Comptroller        OT Problem List: Decreased strength;Decreased range of motion;Decreased activity tolerance;Impaired balance (sitting and/or standing);Decreased coordination;Decreased cognition;Decreased safety awareness;Decreased knowledge of use of DME or AE;Obesity;Impaired UE functional use      OT Treatment/Interventions: Self-care/ADL training;Therapeutic exercise;Neuromuscular education;DME and/or AE instruction;Therapeutic activities;Cognitive remediation/compensation;Patient/family education;Balance training    OT Goals(Current goals can be found in the care plan section) Acute Rehab OT Goals Patient Stated Goal: to get back to normal OT Goal Formulation: With patient Time For Goal Achievement: 10/29/21 Potential to Achieve Goals: Good ADL Goals Pt Will Perform Eating: with min assist;with adaptive utensils;sitting Pt Will Perform Grooming: with min assist;with adaptive equipment;sitting Pt Will Perform Upper Body Bathing: with mod assist;sitting Pt Will Transfer to Toilet: with mod assist;squat pivot transfer;bedside commode Pt/caregiver will Perform Home Exercise Program: Increased ROM;Increased strength;Right Upper extremity;Left upper extremity;With Supervision;With written HEP provided  OT Frequency: Min 3X/week   Barriers to D/C:            Co-evaluation              AM-PAC OT "6 Clicks" Daily Activity     Outcome Measure Help from another person eating meals?: A Lot Help from another person taking care of  personal grooming?: A Lot Help from another person toileting, which includes using toliet, bedpan, or urinal?: Total Help from another person bathing (including washing, rinsing, drying)?: Total Help from another person to put on and taking off  regular upper body clothing?: Total Help from another person to put on and taking off regular lower body clothing?: Total 6 Click Score: 8   End of Session Nurse Communication: Mobility status  Activity Tolerance: Patient limited by fatigue Patient left: in chair;with call bell/phone within reach;with family/visitor present  OT Visit Diagnosis: Unsteadiness on feet (R26.81);Muscle weakness (generalized) (M62.81)                Time: 8657-8469 OT Time Calculation (min): 14 min Charges:  OT General Charges $OT Visit: 1 Visit OT Evaluation $OT Eval Moderate Complexity: 1 Mod  Nilsa Nutting., OTR/L Acute Rehabilitation Services Pager [phone] Office [phone]   Lucille Passy M 10/15/2021, 1:16 PM

## 2021-10-15 NOTE — Progress Notes (Signed)
[facility]  NAME:  Sara Chan, MRN:  [id number], DOB:  30-Aug-1971, LOS: 9 ADMISSION DATE:  10/06/2021, CONSULTATION DATE:  10/06/2021 REFERRING MD:  EDP, CHIEF COMPLAINT:  AHRF, Hypertensive emergency   History of Present Illness:  50 year old female who is known to have flu approximately 2 months ago and has never returned to her normal pulmonary baseline.  Patient has been experiencing increasing shortness of breath of the last 2 months without fevers chills or sweats.  Presnted to ED. Initially treated with steroids and bronchodilators.  Was going to have CT Chest for further evaluation but became more hypoxemic and hypercapnic reqiuiring bipap and then intubation after receiving some sedation for scan. An arterial line was placed and her BP was over 300 SBP. Nitro drip started.  Elevate legs  Pertinent  Medical History   Past Medical History:  Diagnosis Date   Arthritis    Diabetes mellitus without complication ([place])    Hypertension    Significant Hospital Events: Including procedures, antibiotic start and stop dates in addition to other pertinent events   12/21 presented to the ED with respiratory concerns, initially requiring biPAP but then intubated after receiving sedation for scan; noted to be in hypertensive emergency. PCCM consulted for admission to ICU 12/23 NMB initaited for hypercapnia, hypoxemia, wheezing concerning for severe asthma 12/25 NMB discontinued, ABGs and bronchospasm improved 12/28 still with significant secretions overnight 12/29 less sedated, following commands no overnight events 12/30 extubated 12/29,  Interim History / Subjective:   No overnight events Awake alert and interactive Denies specific complaints  Objective   Blood pressure (!) 158/99, pulse 93, temperature 99.2 F (37.3 C), temperature source Oral, resp. rate 16, height 5\' 2"  (1.575 m), weight 122.3 kg, SpO2 100 %.    Vent Mode: CPAP;PSV FiO2 (%):  [40 %] 40 % PEEP:  [5 cmH20] 5  cmH20 Pressure Support:  [5 cmH20] 5 cmH20   Intake/Output Summary (Last 24 hours) at 10/15/2021 0845 Last data filed at 10/15/2021 0700 Gross per 24 hour  Intake 1143.04 ml  Output 1500 ml  Net -356.96 ml   Filed Weights   10/11/21 0500 10/12/21 0500 10/15/21 0447  Weight: 125.3 kg 122.7 kg 122.3 kg    Examination: Gen:   Does not appear to be in distress HEENT: On nasal cannula Lungs:   Mild wheezing CV:         S1-S2 appreciated Abd:      Bowel sounds appreciated Ext: Mild edema, no clubbing Skin:      Warm and dry; no rashes Neuro:   Sedated   Labs/imaging reviewed: Chest x-ray 12/27 right upper lobe atelectasis improved-reviewed by myself on nasal cannula Hypokalemia   Resolved Hospital Problem list   Hypophosphatemia Hypertensive emergency Circulatory shock  Assessment & Plan:   Acute on chronic hypoxic and hypercapnic respiratory failure Recent influenza A respiratory infection Acute severe exacerbation of asthma Mucous plugging -Chest x-ray improved -Able to tolerate extubation -On oxygen supplementation -Bronchodilators  Acute pulmonary edema -Lasix held secondary to increase in BUN/creatinine -We will reinitiate home antihypertensives, was on mild diuretic -Restart Norvasc  Acute kidney injury -Avoid nephrotoxic medications -Monitor and replete electrolytes -BUN/creatinine continues to improve  Severe deconditioning -We will consult PT OT -Will likely need significant assistance as she was sick for few weeks prior to coming into the hospital  Diabetes -Continue SSI  Continue oxygen supplementation Continue bronchodilators PT OT eval and treat Transfer to medical floor   Best Practice (right click and "Reselect  all SmartList Selections" daily)   Diet/type: Regular consistency (see orders) DVT prophylaxis: prophylactic heparin  GI prophylaxis: PPI Lines: N/A Foley:  N/A Code Status:  full code Last date of multidisciplinary goals  of care discussion [husband updated at the bedside 12/26]  Labs   CBC: Recent Labs  Lab 10/11/21 0446 10/12/21 0411 10/12/21 0440 10/13/21 0412 10/14/21 0535 10/15/21 0434  WBC 14.6*  --  13.9* 11.3* 9.3 11.1*  HGB 10.3* 11.2* 10.1* 10.0* 10.5* 10.4*  HCT 30.6* 33.0* 30.2* 30.8* 32.5* 33.4*  MCV 83.2  --  83.4 85.1 85.8 88.4  PLT 283  --  294 343 349 094    Basic Metabolic Panel: Recent Labs  Lab 10/08/21 2119 10/09/21 0452 10/11/21 0446 10/12/21 0411 10/12/21 0440 10/12/21 0942 10/13/21 0412 10/14/21 0535 10/15/21 0434  NA  --    < > 141 141 141  --  147* 143 139  K  --    < > 4.3 3.4* 3.2* 3.6 3.0* 3.2* 3.3*  CL  --    < > 102  --  100  --  102 105 102  CO2  --    < > 28  --  29  --  28 27 27   GLUCOSE  --    < > 134*  --  143*  --  91 230* 162*  BUN  --    < > 64*  --  76*  --  87* 84* 69*  CREATININE  --    < > 1.99*  --  2.01*  --  1.90* 1.66* 1.26*  CALCIUM  --    < > 8.7*  --  8.8*  --  9.0 9.2 9.1  MG 2.8*  --   --   --   --   --   --   --   --   PHOS 6.4*  --   --   --   --   --   --   --   --    < > = values in this interval not displayed.   GFR: Estimated Creatinine Clearance: 65.1 mL/min (A) (by C-G formula based on SCr of 1.26 mg/dL (H)). Recent Labs  Lab 10/08/21 1340 10/09/21 0530 10/12/21 0440 10/13/21 0412 10/14/21 0535 10/15/21 0434  WBC  --    < > 13.9* 11.3* 9.3 11.1*  LATICACIDVEN 1.7  --   --   --   --   --    < > = values in this interval not displayed.    Liver Function Tests: No results for input(s): AST, ALT, ALKPHOS, BILITOT, PROT, ALBUMIN in the last 168 hours.  No results for input(s): LIPASE, AMYLASE in the last 168 hours. No results for input(s): AMMONIA in the last 168 hours.  ABG    Component Value Date/Time   PHART 7.495 (H) 10/12/2021 0411   PCO2ART 40.7 10/12/2021 0411   PO2ART 77 (L) 10/12/2021 0411   HCO3 31.2 (H) 10/12/2021 0411   TCO2 32 10/12/2021 0411   ACIDBASEDEF 1.0 10/10/2021 0404   O2SAT 96.0  10/12/2021 0411     Coagulation Profile: No results for input(s): INR, PROTIME in the last 168 hours.   Cardiac Enzymes: No results for input(s): CKTOTAL, CKMB, CKMBINDEX, TROPONINI in the last 168 hours.  HbA1C: Hgb A1c MFr Bld  Date/Time Value Ref Range Status  10/06/2021 01:16 PM 9.9 (H) 4.8 - 5.6 % Final    Comment:    (NOTE) Pre diabetes:  5.7%-6.4%  Diabetes:              >6.4%  Glycemic control for   <7.0% adults with diabetes     CBG: Recent Labs  Lab 10/14/21 1518 10/14/21 1936 10/14/21 2329 10/15/21 0334 10/15/21 0723  GLUCAP 186* 120* 174* 140* 132*   The patient is critically ill with multiple organ systems failure and requires high complexity decision making for assessment and support, frequent evaluation and titration of therapies, application of advanced monitoring technologies and extensive interpretation of multiple databases. Critical Care Time devoted to patient care services described in this note independent of APP/resident time (if applicable)  is 30 minutes.   Sherrilyn Rist MD [place] Personal pager: See Amion If unanswered, please page CCM On-call: [phone]

## 2021-10-15 NOTE — Progress Notes (Signed)
[facility] ADULT ICU REPLACEMENT PROTOCOL   The patient does apply for the [facility] Adult ICU Electrolyte Replacment Protocol based on the criteria listed below:   1.Exclusion criteria: TCTS patients, ECMO patients, and Dialysis patients 2. Is GFR >/= 30 ml/min? Yes.    Patient's GFR today is 51 3. Is SCr </= 2? Yes.   Patient's SCr is 1.26 mg/dL 4. Did SCr increase >/= 0.5 in 24 hours? No. 5.Pt's weight >40kg  Yes.   6. Abnormal electrolyte(s):  K 3.3  7. Electrolytes replaced per protocol 8.  Call MD STAT for K+ </= 2.5, Phos </= 1, or Mag </= 1 Physician:  R. Staci Righter R Agapita Savarino 10/15/2021 5:22 AM

## 2021-10-15 NOTE — Progress Notes (Signed)
° °  Inpatient Rehab Admissions Coordinator :  Per therapy recommendations, patient was screened for CIR candidacy by Danne Baxter RN MSN.  At this time patient appears to be a potential candidate for CIR, BUT her [employer] in nonparticipating or not contracted at this time . May be a [facility]. I will not place a rehab consult at this time. Please call me with any questions.  Danne Baxter RN MSN Admissions Coordinator [phone]

## 2021-10-16 DIAGNOSIS — J9601 Acute respiratory failure with hypoxia: Secondary | ICD-10-CM | POA: Diagnosis not present

## 2021-10-16 DIAGNOSIS — J9602 Acute respiratory failure with hypercapnia: Secondary | ICD-10-CM | POA: Diagnosis not present

## 2021-10-16 LAB — GLUCOSE, CAPILLARY
Glucose-Capillary: 137 mg/dL — ABNORMAL HIGH (ref 70–99)
Glucose-Capillary: 139 mg/dL — ABNORMAL HIGH (ref 70–99)
Glucose-Capillary: 145 mg/dL — ABNORMAL HIGH (ref 70–99)
Glucose-Capillary: 151 mg/dL — ABNORMAL HIGH (ref 70–99)
Glucose-Capillary: 156 mg/dL — ABNORMAL HIGH (ref 70–99)
Glucose-Capillary: 230 mg/dL — ABNORMAL HIGH (ref 70–99)

## 2021-10-16 LAB — BASIC METABOLIC PANEL
Anion gap: 12 (ref 5–15)
BUN: 47 mg/dL — ABNORMAL HIGH (ref 6–20)
CO2: 24 mmol/L (ref 22–32)
Calcium: 9.4 mg/dL (ref 8.9–10.3)
Chloride: 102 mmol/L (ref 98–111)
Creatinine, Ser: 1.12 mg/dL — ABNORMAL HIGH (ref 0.44–1.00)
GFR, Estimated: 59 mL/min — ABNORMAL LOW (ref >60)
Glucose, Bld: 144 mg/dL — ABNORMAL HIGH (ref 70–99)
Potassium: 3.8 mmol/L (ref 3.5–5.1)
Sodium: 138 mmol/L (ref 135–145)

## 2021-10-16 LAB — MAGNESIUM: Magnesium: 2.3 mg/dL (ref 1.7–2.4)

## 2021-10-16 LAB — VITAMIN B12: Vitamin B-12: 775 pg/mL (ref 180–914)

## 2021-10-16 LAB — IRON AND TIBC
Iron: 67 ug/dL (ref 28–170)
Saturation Ratios: 28 % (ref 10.4–31.8)
TIBC: 238 ug/dL — ABNORMAL LOW (ref 250–450)
UIBC: 171 ug/dL

## 2021-10-16 LAB — FERRITIN: Ferritin: 682 ng/mL — ABNORMAL HIGH (ref 11–307)

## 2021-10-16 MED ORDER — DICLOFENAC SODIUM 1 % EX GEL
2.0000 g | Freq: Four times a day (QID) | CUTANEOUS | Status: DC
  Administered 2021-10-16 – 2021-10-17 (×3): 2 g via TOPICAL
  Filled 2021-10-16: qty 100

## 2021-10-16 MED ORDER — INSULIN ASPART 100 UNIT/ML IJ SOLN
0.0000 [IU] | Freq: Three times a day (TID) | INTRAMUSCULAR | Status: DC
  Administered 2021-10-16: 2 [IU] via SUBCUTANEOUS
  Administered 2021-10-17: 3 [IU] via SUBCUTANEOUS
  Administered 2021-10-17: 2 [IU] via SUBCUTANEOUS
  Administered 2021-10-17: 3 [IU] via SUBCUTANEOUS
  Administered 2021-10-18: 8 [IU] via SUBCUTANEOUS
  Administered 2021-10-18 – 2021-10-19 (×4): 3 [IU] via SUBCUTANEOUS

## 2021-10-16 MED ORDER — INSULIN ASPART 100 UNIT/ML IJ SOLN: 0.0000 [IU] | Freq: Every day | INTRAMUSCULAR | Status: DC

## 2021-10-16 MED ORDER — FUROSEMIDE 10 MG/ML IJ SOLN
20.0000 mg | Freq: Once | INTRAMUSCULAR | Status: AC
  Administered 2021-10-16: 20 mg via INTRAVENOUS
  Filled 2021-10-16: qty 2

## 2021-10-16 NOTE — Progress Notes (Signed)
Progress Note    Sara Chan  UVO:[id number] DOB: October 15, 1971  DOA: 10/06/2021 PCP: Dineen Kid, MD    Brief Narrative:     Medical records reviewed and are as summarized below:  Sara Chan is an 50 y.o. female who is known to have flu approximately 2 months ago and has never returned to her normal pulmonary baseline.  Patient has been experiencing increasing shortness of breath of the last 2 months without fevers chills or sweats.  Presnted to ED. Initially treated with steroids and bronchodilators.  Was going to have CT Chest for further evaluation but became more hypoxemic and hypercapnic reqiuiring bipap and then intubation after receiving some sedation for scan. An arterial line was placed and her BP was over 300   Assessment/Plan:   Principal Problem:   Respiratory failure (HCC)   Acute on chronic hypoxic and hypercapnic respiratory failure Recent influenza A respiratory infection Acute severe exacerbation of asthma with Mucous plugging -Chest x-ray improved -extubated -weaned to RA -Bronchodilators   Acute pulmonary edema -We will reinitiate home antihypertensives, was on mild diuretic -Restart Norvasc   Acute kidney injury -resolving   Severe deconditioning -We will consult PT OT- CIR vs SNF   Diabetes -Continue SSI but change to with meals  Morbid obesity Body mass index is 49.35 kg/m.   Family Communication/Anticipated D/C date and plan/Code Status   DVT prophylaxis: heparin Code Status: Full Code.  Family Communication: at bedside Disposition Plan: Status is: Inpatient  Remains inpatient appropriate because: needs safe d/c plan         Medical Consultants:   PCCM     Subjective:   C/o pain in b/l feet Swelling better  Objective:    Vitals:   10/16/21 0952 10/16/21 1119 10/16/21 1124 10/16/21 1440  BP: 115/68 121/74 121/74   Pulse:  98 99   Resp:  (!) 35 (!) 21   Temp:  98.8 F (37.1 C) 98.8 F (37.1 C)   TempSrc:   Oral    SpO2:  95%  100%  Weight:      Height:        Intake/Output Summary (Last 24 hours) at 10/16/2021 1500 Last data filed at 10/16/2021 1300 Gross per 24 hour  Intake 340 ml  Output 1150 ml  Net -810 ml   Filed Weights   10/12/21 0500 10/15/21 0447 10/16/21 0020  Weight: 122.7 kg 122.3 kg 122.4 kg    Exam:  General: Appearance:    Severely obese female in no acute distress     Lungs:     Diminished, respirations unlabored  Heart:    Normal heart rate.    MS:   All extremities are intact.    Neurologic:   Awake, alert     Data Reviewed:   I have personally reviewed following labs and imaging studies:  Labs: Labs show the following:   Basic Metabolic Panel: Recent Labs  Lab 10/12/21 0440 10/12/21 0942 10/13/21 0412 10/14/21 0535 10/15/21 0434 10/16/21 0417  NA 141  --  147* 143 139 138  K 3.2*   < > 3.0* 3.2* 3.3* 3.8  CL 100  --  102 105 102 102  CO2 29  --  28 27 27 24   GLUCOSE 143*  --  91 230* 162* 144*  BUN 76*  --  87* 84* 69* 47*  CREATININE 2.01*  --  1.90* 1.66* 1.26* 1.12*  CALCIUM 8.8*  --  9.0 9.2 9.1 9.4  MG  --   --   --   --   --  2.3   < > = values in this interval not displayed.   GFR Estimated Creatinine Clearance: 73.3 mL/min (A) (by C-G formula based on SCr of 1.12 mg/dL (H)). Liver Function Tests: No results for input(s): AST, ALT, ALKPHOS, BILITOT, PROT, ALBUMIN in the last 168 hours. No results for input(s): LIPASE, AMYLASE in the last 168 hours. No results for input(s): AMMONIA in the last 168 hours. Coagulation profile No results for input(s): INR, PROTIME in the last 168 hours.  CBC: Recent Labs  Lab 10/11/21 0446 10/12/21 0411 10/12/21 0440 10/13/21 0412 10/14/21 0535 10/15/21 0434  WBC 14.6*  --  13.9* 11.3* 9.3 11.1*  HGB 10.3* 11.2* 10.1* 10.0* 10.5* 10.4*  HCT 30.6* 33.0* 30.2* 30.8* 32.5* 33.4*  MCV 83.2  --  83.4 85.1 85.8 88.4  PLT 283  --  294 343 349 346   Cardiac Enzymes: No results for input(s):  CKTOTAL, CKMB, CKMBINDEX, TROPONINI in the last 168 hours. BNP (last 3 results) No results for input(s): PROBNP in the last 8760 hours. CBG: Recent Labs  Lab 10/15/21 2056 10/16/21 0017 10/16/21 0417 10/16/21 0805 10/16/21 1116  GLUCAP 139* 151* 145* 137* 230*   D-Dimer: No results for input(s): DDIMER in the last 72 hours. Hgb A1c: No results for input(s): HGBA1C in the last 72 hours. Lipid Profile: Recent Labs    10/14/21 0535  TRIG 395*   Thyroid function studies: No results for input(s): TSH, T4TOTAL, T3FREE, THYROIDAB in the last 72 hours.  Invalid input(s): FREET3 Anemia work up: Recent Labs    10/16/21 1133  VITAMINB12 775  FERRITIN 682*  TIBC 238*  IRON 67   Sepsis Labs: Recent Labs  Lab 10/12/21 0440 10/13/21 0412 10/14/21 0535 10/15/21 0434  WBC 13.9* 11.3* 9.3 11.1*    Microbiology No results found for this or any previous visit (from the past 240 hour(s)).  Procedures and diagnostic studies:  No results found.  Medications:    albuterol  2.5 mg Nebulization Q6H   amLODipine  5 mg Oral BID   Chlorhexidine Gluconate Cloth  6 each Topical Daily   feeding supplement (GLUCERNA SHAKE)  237 mL Oral TID BM   heparin  5,000 Units Subcutaneous Q8H   losartan  100 mg Oral Daily   And   hydrochlorothiazide  25 mg Oral Daily   insulin aspart  0-20 Units Subcutaneous Q4H   insulin detemir  12 Units Subcutaneous BID   labetalol  20 mg Intravenous Once   mouth rinse  15 mL Mouth Rinse BID   multivitamin with minerals  1 tablet Oral Daily   sodium chloride flush  10-40 mL Intracatheter Q12H   Continuous Infusions:  sodium chloride Stopped (10/08/21 0634)   sodium chloride Stopped (10/06/21 2216)     LOS: 10 days   Geradine Girt  [facility]   How to contact the [facility] Attending or Consulting provider [place] or covering provider during after hours [place], for this patient?  Check the care team in [facility] and look for a)  attending/consulting TRH provider listed and b) the [facility] team listed Log into [email or web address] and use [place]'s universal password to access. If you do not have the password, please contact the hospital operator. Locate the [facility] provider you are looking for under [facility] and page to a number that you can be directly reached. If you still have difficulty reaching the provider, please page the [facility] (Director on Call) for the Hospitalists listed on amion  for assistance.  10/16/2021, 3:00 PM

## 2021-10-16 NOTE — Progress Notes (Signed)
Occupational Therapy Treatment Patient Details Name: Sara Chan MRN: [id number] DOB: 1970-12-27 Today's Date: 10/16/2021   History of present illness 50 yo admitted 12/21 with respiratory difficulty who received sedation for CT scan and required intubation after also with HTN urgency. Extubated 12/29. PMhx: flu in Oct 2022 with pt not recovering to pulmonary baseline, DM, HTN, arthritis   OT comments  Pt in session took increase time to arouse but then willing and motivated to participate. Pt was able to complete bed mobility with mod A with HOB elevated, sit to stand transfers with increase in time, cues and blocking LE when standing with min to mod assist due to posterior/anterior lean. Pt was able to then take 2-3 steps at the EOB with use of RW. Pt required when resting at EOB completed self feeding with min to moderate assist due to decrease in strength/coordination of RUE. Pt currently with functional limitations due to the deficits listed below (see OT Problem List).  Pt will benefit from skilled OT to increase their safety and independence with ADL and functional mobility for ADL to facilitate discharge to venue listed below.     Recommendations for follow up therapy are one component of a multi-disciplinary discharge planning process, led by the attending physician.  Recommendations may be updated based on patient status, additional functional criteria and insurance authorization.    Follow Up Recommendations  Acute inpatient rehab (3hours/day)    Assistance Recommended at Discharge Frequent or constant Supervision/Assistance  Equipment Recommendations  None recommended by OT    Recommendations for Other Services Rehab consult    Precautions / Restrictions Precautions Precautions: Fall Precaution Comments: bil UE ataxia, decrease sensation to BLE Restrictions Weight Bearing Restrictions: No       Mobility Bed Mobility Overal bed mobility: Needs Assistance Bed Mobility:  Supine to Sit;Sit to Supine     Supine to sit: Mod assist;HOB elevated Sit to supine: Min assist;HOB elevated        Transfers Overall transfer level: Needs assistance Equipment used: Rolling walker (2 wheels) Transfers: Sit to/from Stand Sit to Stand: Min assist;From elevated surface                 Balance Overall balance assessment: Needs assistance   Sitting balance-Leahy Scale: Fair Sitting balance - Comments: Pt able to complete self feeding at EOB for couple sips of liquids   Standing balance support: Bilateral upper extremity supported Standing balance-Leahy Scale: Poor Standing balance comment: physical assist for balance in standing                           ADL either performed or assessed with clinical judgement   ADL Overall ADL's : Needs assistance/impaired Eating/Feeding: Cueing for safety;Cueing for sequencing;Sitting;Moderate assistance   Grooming: Wash/dry hands;Wash/dry face;Minimal assistance;Cueing for safety;Cueing for sequencing;Sitting   Upper Body Bathing: Maximal assistance;Cueing for safety;Cueing for sequencing;Sitting   Lower Body Bathing: Maximal assistance;Cueing for safety;Cueing for sequencing;Sit to/from stand   Upper Body Dressing : Maximal assistance;Cueing for safety;Cueing for sequencing;Sitting   Lower Body Dressing: Maximal assistance;Cueing for safety;Cueing for sequencing;Sit to/from stand   Toilet Transfer: Maximal assistance;Cueing for safety;Cueing for sequencing   Toileting- Clothing Manipulation and Hygiene: Total assistance;Cueing for safety;Cueing for sequencing;Sit to/from stand       Functional mobility during ADLs: Moderate assistance;Cueing for safety;Cueing for sequencing;Rolling walker (2 wheels)      Extremity/Trunk Assessment Upper Extremity Assessment Upper Extremity Assessment: RUE deficits/detail;LUE deficits/detail RUE Deficits /  Details: Pt was able to complete shoulder B AROM in siting  about 25 degrees in each plane for couple seconds LUE Deficits / Details: Pt was able to complete shoulder B AROM in siting about 25 degrees in each plane for couple seconds   Lower Extremity Assessment Lower Extremity Assessment: Defer to PT evaluation        Vision       Perception     Praxis      Cognition Arousal/Alertness: Awake/alert Behavior During Therapy: Flat affect Overall Cognitive Status: Impaired/Different from baseline Area of Impairment: Attention                   Current Attention Level: Sustained;Focused   Following Commands: Follows multi-step commands inconsistently   Awareness: Intellectual Problem Solving: Slow processing;Requires verbal cues;Requires tactile cues General Comments: Pt needs cues to increase in attention to tasks          Exercises     Shoulder Instructions       General Comments      Pertinent Vitals/ Pain       Pain Assessment: No/denies pain Facial Expression: Relaxed, neutral Body Movements: Absence of movements Muscle Tension: Relaxed Compliance with ventilator (intubated pts.): N/A Vocalization (extubated pts.): N/A CPOT Total: 0  Home Living                                          Prior Functioning/Environment              Frequency  Min 3X/week        Progress Toward Goals  OT Goals(current goals can now be found in the care plan section)  Progress towards OT goals: Progressing toward goals  Acute Rehab OT Goals Patient Stated Goal: to be able to get up OT Goal Formulation: With patient Time For Goal Achievement: 10/29/21 Potential to Achieve Goals: Good ADL Goals Pt Will Perform Eating: with min assist;with adaptive utensils;sitting Pt Will Perform Grooming: with min assist;with adaptive equipment;sitting Pt Will Perform Upper Body Bathing: with mod assist;sitting Pt Will Transfer to Toilet: with mod assist;squat pivot transfer;bedside commode Pt/caregiver will  Perform Home Exercise Program: Increased ROM;Increased strength;Right Upper extremity;Left upper extremity;With Supervision;With written HEP provided  Plan Discharge plan remains appropriate    Co-evaluation                 AM-PAC OT "6 Clicks" Daily Activity     Outcome Measure   Help from another person eating meals?: A Little Help from another person taking care of personal grooming?: A Lot Help from another person toileting, which includes using toliet, bedpan, or urinal?: Total Help from another person bathing (including washing, rinsing, drying)?: A Lot Help from another person to put on and taking off regular upper body clothing?: A Lot Help from another person to put on and taking off regular lower body clothing?: A Lot 6 Click Score: 12    End of Session Equipment Utilized During Treatment: Gait belt;Rolling walker (2 wheels)  OT Visit Diagnosis: Unsteadiness on feet (R26.81);Muscle weakness (generalized) (M62.81)   Activity Tolerance Patient limited by fatigue   Patient Left in bed;with call bell/phone within reach;with bed alarm set   Nurse Communication          Time: [phone] OT Time Calculation (min): 48 min  Charges: OT General Charges $OT Visit: 1 Visit OT Treatments $Self Care/Home  Management : 38-52 mins  Joeseph Amor OTR/L  Acute Rehab Services  [phone] office number [phone] pager number   Joeseph Amor 10/16/2021, 9:57 AM

## 2021-10-17 ENCOUNTER — Encounter (HOSPITAL_COMMUNITY): Payer: Self-pay | Admitting: Internal Medicine

## 2021-10-17 DIAGNOSIS — Z978 Presence of other specified devices: Secondary | ICD-10-CM

## 2021-10-17 DIAGNOSIS — E1169 Type 2 diabetes mellitus with other specified complication: Secondary | ICD-10-CM

## 2021-10-17 DIAGNOSIS — E662 Morbid (severe) obesity with alveolar hypoventilation: Secondary | ICD-10-CM

## 2021-10-17 LAB — GLUCOSE, CAPILLARY
Glucose-Capillary: 126 mg/dL — ABNORMAL HIGH (ref 70–99)
Glucose-Capillary: 132 mg/dL — ABNORMAL HIGH (ref 70–99)
Glucose-Capillary: 165 mg/dL — ABNORMAL HIGH (ref 70–99)
Glucose-Capillary: 170 mg/dL — ABNORMAL HIGH (ref 70–99)
Glucose-Capillary: 123 mg/dL — ABNORMAL HIGH (ref 70–99)

## 2021-10-17 LAB — CBC
HCT: 33.9 % — ABNORMAL LOW (ref 36.0–46.0)
Hemoglobin: 10.6 g/dL — ABNORMAL LOW (ref 12.0–15.0)
MCH: 27.2 pg (ref 26.0–34.0)
MCHC: 31.3 g/dL (ref 30.0–36.0)
MCV: 87.1 fL (ref 80.0–100.0)
Platelets: 359 10*3/uL (ref 150–400)
RBC: 3.89 MIL/uL (ref 3.87–5.11)
RDW: 15.4 % (ref 11.5–15.5)
WBC: 9.9 10*3/uL (ref 4.0–10.5)
nRBC: 0 % (ref 0.0–0.2)

## 2021-10-17 LAB — BASIC METABOLIC PANEL
Anion gap: 9 (ref 5–15)
BUN: 40 mg/dL — ABNORMAL HIGH (ref 6–20)
CO2: 26 mmol/L (ref 22–32)
Calcium: 9.3 mg/dL (ref 8.9–10.3)
Chloride: 100 mmol/L (ref 98–111)
Creatinine, Ser: 1.14 mg/dL — ABNORMAL HIGH (ref 0.44–1.00)
GFR, Estimated: 58 mL/min — ABNORMAL LOW (ref >60)
Glucose, Bld: 145 mg/dL — ABNORMAL HIGH (ref 70–99)
Potassium: 3.7 mmol/L (ref 3.5–5.1)
Sodium: 135 mmol/L (ref 135–145)

## 2021-10-17 MED ORDER — INSULIN DETEMIR 100 UNIT/ML ~~LOC~~ SOLN
15.0000 [IU] | Freq: Two times a day (BID) | SUBCUTANEOUS | Status: DC
  Administered 2021-10-17 – 2021-10-19 (×4): 15 [IU] via SUBCUTANEOUS
  Filled 2021-10-17 (×5): qty 0.15

## 2021-10-17 MED ORDER — GABAPENTIN 300 MG PO CAPS: 600.0000 mg | ORAL_CAPSULE | ORAL | Status: DC

## 2021-10-17 MED ORDER — ALBUTEROL SULFATE (2.5 MG/3ML) 0.083% IN NEBU
2.5000 mg | INHALATION_SOLUTION | Freq: Three times a day (TID) | RESPIRATORY_TRACT | Status: DC
  Administered 2021-10-18 (×3): 2.5 mg via RESPIRATORY_TRACT
  Filled 2021-10-17 (×4): qty 3

## 2021-10-17 MED ORDER — FUROSEMIDE 10 MG/ML IJ SOLN
20.0000 mg | Freq: Once | INTRAMUSCULAR | Status: AC
  Administered 2021-10-17: 20 mg via INTRAVENOUS
  Filled 2021-10-17: qty 2

## 2021-10-17 MED ORDER — GABAPENTIN 300 MG PO CAPS
600.0000 mg | ORAL_CAPSULE | Freq: Every morning | ORAL | Status: DC
  Administered 2021-10-17 – 2021-10-19 (×3): 600 mg via ORAL
  Filled 2021-10-17 (×2): qty 2

## 2021-10-17 MED ORDER — BUDESONIDE 0.25 MG/2ML IN SUSP
0.2500 mg | Freq: Two times a day (BID) | RESPIRATORY_TRACT | Status: DC
  Administered 2021-10-17 – 2021-10-18 (×3): 0.25 mg via RESPIRATORY_TRACT
  Filled 2021-10-17 (×4): qty 2

## 2021-10-17 MED ORDER — GABAPENTIN 300 MG PO CAPS
900.0000 mg | ORAL_CAPSULE | Freq: Every evening | ORAL | Status: DC
  Administered 2021-10-17 – 2021-10-18 (×2): 900 mg via ORAL
  Filled 2021-10-17 (×2): qty 3

## 2021-10-17 NOTE — TOC Progression Note (Signed)
Transition of Care [facility]) - Progression Note    Patient Details  Name: Sara Chan MRN: [id number] Date of Birth: September 01, 1971  Transition of Care [facility] CM/SW [place], [place] Phone Number: 10/17/2021, 1:56 PM  Clinical Narrative:    CSW contacted admissions at [facility] inpatient rehab in [place] to inquire about accepting pt's insurance. Admissions explained that with this type of insurance the pt has be to be reviewed and denied by our CIR and another inpatient rehab in order for them to qualify at [facility]. The insurance needs 2 denials in order for [facility] to start claim, admissions stated that most pt's with this plan may not understand how it works.         Expected Discharge Plan and Services                                                 Social Determinants of Health (SDOH) Interventions    Readmission Risk Interventions No flowsheet data found.

## 2021-10-17 NOTE — Progress Notes (Signed)
Progress Note    Sara Chan  DXI:[id number] DOB: 02-11-1971  DOA: 10/06/2021 PCP: Dineen Kid, MD    Brief Narrative:     Medical records reviewed and are as summarized below:  Sara Chan is an 51 y.o. female who is known to have flu approximately 2 months ago and has never returned to her normal pulmonary baseline.  Patient has been experiencing increasing shortness of breath of the last 2 months without fevers chills or sweats.  Presnted to ED. Initially treated with steroids and bronchodilators.  Was going to have CT Chest for further evaluation but became more hypoxemic and hypercapnic reqiuiring bipap and then intubation after receiving some sedation for scan. An arterial line was placed and her BP was over 300.  Needs IR vs SNF    Assessment/Plan:   Principal Problem:   Respiratory failure (Circle)   Acute on chronic hypoxic and hypercapnic respiratory failure Recent influenza A respiratory infection Acute severe exacerbation of asthma with Mucous plugging -Chest x-ray improved but some atelectasis- add incentive spirometry -extubated -weaned to RA -Bronchodilators -add Pulmicort   Acute pulmonary edema -We will reinitiate home antihypertensives, was on mild diuretic -Restart Norvasc -IV lasix dosed daily   Acute kidney injury -resolving   Severe deconditioning -We will consult PT OT- CIR vs SNF   Diabetes -Continue SSI but change to with meals  Morbid obesity Body mass index is 49.56 kg/m.   Family Communication/Anticipated D/C date and plan/Code Status   DVT prophylaxis: heparin Code Status: Full Code.  Family Communication: at bedside Disposition Plan: Status is: Inpatient  Remains inpatient appropriate because: needs safe d/c plan-SNF vs IR         Medical Consultants:   PCCM     Subjective:   Take Neurontin at home for her feet C/o occasional wheezing Does not like the CPAP here  Objective:    Vitals:   10/17/21 0529  10/17/21 0820 10/17/21 1030 10/17/21 1429  BP:  (!) 141/92 140/85   Pulse:   89   Resp:   18   Temp:  98 F (36.7 C)    TempSrc:  Oral    SpO2:   100% 100%  Weight: 122.9 kg     Height:        Intake/Output Summary (Last 24 hours) at 10/17/2021 1447 Last data filed at 10/17/2021 1029 Gross per 24 hour  Intake 600 ml  Output 1300 ml  Net -700 ml   Filed Weights   10/15/21 0447 10/16/21 0020 10/17/21 0529  Weight: 122.3 kg 122.4 kg 122.9 kg    Exam:  General: Appearance:    Severely obese female in no acute distress- somewhat anxious     Lungs:     [place] on side of face, respirations unlabored- upper airway wheeze  Heart:    Normal heart rate.    MS:   All extremities are intact.    Neurologic:   Awake, alert, oriented x 3. No apparent focal neurological           defect.          Data Reviewed:   I have personally reviewed following labs and imaging studies:  Labs: Labs show the following:   Basic Metabolic Panel: Recent Labs  Lab 10/13/21 0412 10/14/21 0535 10/15/21 0434 10/16/21 0417 10/17/21 0417  NA 147* 143 139 138 135  K 3.0* 3.2* 3.3* 3.8 3.7  CL 102 105 102 102 100  CO2 28 27 27 24  26  GLUCOSE 91 230* 162* 144* 145*  BUN 87* 84* 69* 47* 40*  CREATININE 1.90* 1.66* 1.26* 1.12* 1.14*  CALCIUM 9.0 9.2 9.1 9.4 9.3  MG  --   --   --  2.3  --    GFR Estimated Creatinine Clearance: 72.2 mL/min (A) (by C-G formula based on SCr of 1.14 mg/dL (H)). Liver Function Tests: No results for input(s): AST, ALT, ALKPHOS, BILITOT, PROT, ALBUMIN in the last 168 hours. No results for input(s): LIPASE, AMYLASE in the last 168 hours. No results for input(s): AMMONIA in the last 168 hours. Coagulation profile No results for input(s): INR, PROTIME in the last 168 hours.  CBC: Recent Labs  Lab 10/12/21 0440 10/13/21 0412 10/14/21 0535 10/15/21 0434 10/17/21 0417  WBC 13.9* 11.3* 9.3 11.1* 9.9  HGB 10.1* 10.0* 10.5* 10.4* 10.6*  HCT 30.2* 30.8* 32.5* 33.4* 33.9*   MCV 83.4 85.1 85.8 88.4 87.1  PLT 294 343 349 346 359   Cardiac Enzymes: No results for input(s): CKTOTAL, CKMB, CKMBINDEX, TROPONINI in the last 168 hours. BNP (last 3 results) No results for input(s): PROBNP in the last 8760 hours. CBG: Recent Labs  Lab 10/16/21 1614 10/16/21 2042 10/17/21 0615 10/17/21 0814 10/17/21 1119  GLUCAP 139* 156* 126* 132* 165*   D-Dimer: No results for input(s): DDIMER in the last 72 hours. Hgb A1c: No results for input(s): HGBA1C in the last 72 hours. Lipid Profile: No results for input(s): CHOL, HDL, LDLCALC, TRIG, CHOLHDL, LDLDIRECT in the last 72 hours.  Thyroid function studies: No results for input(s): TSH, T4TOTAL, T3FREE, THYROIDAB in the last 72 hours.  Invalid input(s): FREET3 Anemia work up: Recent Labs    10/16/21 1133  VITAMINB12 775  FERRITIN 682*  TIBC 238*  IRON 67   Sepsis Labs: Recent Labs  Lab 10/13/21 0412 10/14/21 0535 10/15/21 0434 10/17/21 0417  WBC 11.3* 9.3 11.1* 9.9    Microbiology No results found for this or any previous visit (from the past 240 hour(s)).  Procedures and diagnostic studies:  No results found.  Medications:    albuterol  2.5 mg Nebulization Q6H   amLODipine  5 mg Oral BID   feeding supplement (GLUCERNA SHAKE)  237 mL Oral TID BM   furosemide  20 mg Intravenous Once   gabapentin  600 mg Oral q morning   gabapentin  900 mg Oral QPM   heparin  5,000 Units Subcutaneous Q8H   losartan  100 mg Oral Daily   And   hydrochlorothiazide  25 mg Oral Daily   insulin aspart  0-15 Units Subcutaneous TID WC   insulin aspart  0-5 Units Subcutaneous QHS   insulin detemir  12 Units Subcutaneous BID   mouth rinse  15 mL Mouth Rinse BID   multivitamin with minerals  1 tablet Oral Daily   Continuous Infusions:     LOS: 11 days   Geradine Girt  [facility]   How to contact the [facility] Attending or Consulting provider [place] or covering provider during after hours [place], for  this patient?  Check the care team in [facility] and look for a) attending/consulting TRH provider listed and b) the [facility] team listed Log into [email or web address] and use [place]'s universal password to access. If you do not have the password, please contact the hospital operator. Locate the [facility] provider you are looking for under [facility] and page to a number that you can be directly reached. If you still have difficulty reaching  the provider, please page the [facility] (Director on Call) for the Hospitalists listed on amion for assistance.  10/17/2021, 2:47 PM

## 2021-10-17 NOTE — TOC Progression Note (Signed)
Transition of Care [facility]) - Progression Note    Patient Details  Name: Sara Chan MRN: [id number] Date of Birth: June 13, 1971  Transition of Care [facility]) CM/SW [place] Phone Number: [phone] 10/17/2021, 2:15 PM  Clinical Narrative:     CSW attempted to meet with pt to discuss inpt rehab options. Pt was asleep and snoring and did not awake to her name being called.  TOC team will continue to assist with discharge planning needs.        Expected Discharge Plan and Services                                                 Social Determinants of Health (SDOH) Interventions    Readmission Risk Interventions No flowsheet data found.

## 2021-10-17 NOTE — Progress Notes (Signed)
Pt declined CPAP tonight. °

## 2021-10-17 NOTE — Progress Notes (Signed)
Pt has home CPAP order. Water given to pt for home unit.

## 2021-10-18 LAB — BASIC METABOLIC PANEL
Anion gap: 15 (ref 5–15)
BUN: 39 mg/dL — ABNORMAL HIGH (ref 6–20)
CO2: 24 mmol/L (ref 22–32)
Calcium: 9.6 mg/dL (ref 8.9–10.3)
Chloride: 96 mmol/L — ABNORMAL LOW (ref 98–111)
Creatinine, Ser: 1.26 mg/dL — ABNORMAL HIGH (ref 0.44–1.00)
GFR, Estimated: 51 mL/min — ABNORMAL LOW (ref >60)
Glucose, Bld: 138 mg/dL — ABNORMAL HIGH (ref 70–99)
Potassium: 3.3 mmol/L — ABNORMAL LOW (ref 3.5–5.1)
Sodium: 135 mmol/L (ref 135–145)

## 2021-10-18 LAB — CBC
HCT: 33.6 % — ABNORMAL LOW (ref 36.0–46.0)
Hemoglobin: 10.5 g/dL — ABNORMAL LOW (ref 12.0–15.0)
MCH: 27 pg (ref 26.0–34.0)
MCHC: 31.3 g/dL (ref 30.0–36.0)
MCV: 86.4 fL (ref 80.0–100.0)
Platelets: 369 10*3/uL (ref 150–400)
RBC: 3.89 MIL/uL (ref 3.87–5.11)
RDW: 15.2 % (ref 11.5–15.5)
WBC: 11 10*3/uL — ABNORMAL HIGH (ref 4.0–10.5)
nRBC: 0 % (ref 0.0–0.2)

## 2021-10-18 LAB — GLUCOSE, CAPILLARY
Glucose-Capillary: 167 mg/dL — ABNORMAL HIGH (ref 70–99)
Glucose-Capillary: 263 mg/dL — ABNORMAL HIGH (ref 70–99)
Glucose-Capillary: 180 mg/dL — ABNORMAL HIGH (ref 70–99)
Glucose-Capillary: 126 mg/dL — ABNORMAL HIGH (ref 70–99)

## 2021-10-18 MED ORDER — POTASSIUM CHLORIDE CRYS ER 20 MEQ PO TBCR
40.0000 meq | EXTENDED_RELEASE_TABLET | Freq: Once | ORAL | Status: AC
  Administered 2021-10-18: 40 meq via ORAL
  Filled 2021-10-18: qty 2

## 2021-10-18 MED ORDER — LOSARTAN POTASSIUM 50 MG PO TABS
100.0000 mg | ORAL_TABLET | Freq: Every day | ORAL | Status: DC
  Administered 2021-10-19: 100 mg via ORAL
  Filled 2021-10-18: qty 2

## 2021-10-18 MED ORDER — GABAPENTIN 300 MG PO CAPS
300.0000 mg | ORAL_CAPSULE | Freq: Every day | ORAL | Status: DC
  Administered 2021-10-18: 300 mg via ORAL
  Filled 2021-10-18 (×3): qty 1

## 2021-10-18 MED ORDER — HYDROCHLOROTHIAZIDE 25 MG PO TABS
25.0000 mg | ORAL_TABLET | Freq: Every day | ORAL | Status: DC
  Administered 2021-10-19: 25 mg via ORAL
  Filled 2021-10-18: qty 1

## 2021-10-18 NOTE — TOC Progression Note (Addendum)
Transition of Care [facility]) - Progression Note    Patient Details  Name: Sara Chan MRN: [id number] Date of Birth: [birth year]  Transition of Care [facility]) CM/SW Contact  Zenon Mayo, RN Phone Number: 10/18/2021, 4:42 PM  Clinical Narrative:    NCM spoke with patient , she states it is ok to make referral to [facility], NCM made referral to [facility] [place] for CIR thru the hub.        Expected Discharge Plan and Services                                                 Social Determinants of Health (SDOH) Interventions    Readmission Risk Interventions No flowsheet data found.

## 2021-10-18 NOTE — Progress Notes (Signed)
Pt has home CPAP.  

## 2021-10-18 NOTE — Progress Notes (Signed)
Physical Therapy Treatment Patient Details Name: Sara Chan MRN: [id number] DOB: Jun 29, 1971 Today's Date: 10/18/2021   History of Present Illness 51 yo admitted 12/21 with respiratory difficulty who received sedation for CT scan and required intubation after also with HTN urgency. Extubated 12/29. PMhx: flu in Oct 2022 with pt not recovering to pulmonary baseline, DM, HTN, arthritis    PT Comments    Patient received sitting up on side of bed with visitor present. Patient agrees to PT. Fatigued, but reports she took a shower this morning with husbands assist. Patient is mod independent with bed mobility, transfers with min assist.  Ambulated 30 feet with min assist and RW. Required 2 seated rest breaks due to fatigue. Patient progressing well toward goals and may be able to go home at discharge if assistance available for ambulation and depending on continued progress.       Recommendations for follow up therapy are one component of a multi-disciplinary discharge planning process, led by the attending physician.  Recommendations may be updated based on patient status, additional functional criteria and insurance authorization.  Follow Up Recommendations  Home health PT     Assistance Recommended at Discharge Frequent or constant Supervision/Assistance  Equipment Recommendations  BSC/3in1;Rolling walker (2 wheels)    Recommendations for Other Services       Precautions / Restrictions Precautions Precautions: Fall Precaution Comments: B feet sore Restrictions Weight Bearing Restrictions: No     Mobility  Bed Mobility Overal bed mobility: Modified Independent Bed Mobility: Supine to Sit;Sit to Supine     Supine to sit: Modified independent (Device/Increase time);HOB elevated Sit to supine: Modified independent (Device/Increase time)        Transfers Overall transfer level: Needs assistance Equipment used: Rolling walker (2 wheels) Transfers: Sit to/from Stand Sit to  Stand: Min guard           General transfer comment: min assist sit to stand from bed and chair    Ambulation/Gait Ambulation/Gait assistance: Min assist Gait Distance (Feet): 30 Feet Assistive device: Rolling walker (2 wheels) Gait Pattern/deviations: Step-through pattern;Decreased step length - right;Decreased step length - left;Shuffle Gait velocity: decr     General Gait Details: ambulated 30 feet with min assist and RW. Required two seated rests during this distance due to fatigue. Patient needs chair nearby with gait attempts for safety.   Stairs             Wheelchair Mobility    Modified Rankin (Stroke Patients Only)       Balance Overall balance assessment: Needs assistance Sitting-balance support: Feet supported Sitting balance-Leahy Scale: Good     Standing balance support: Bilateral upper extremity supported;During functional activity;Reliant on assistive device for balance Standing balance-Leahy Scale: Poor Standing balance comment: physical assist for balance in standing                            Cognition Arousal/Alertness: Awake/alert Behavior During Therapy: WFL for tasks assessed/performed Overall Cognitive Status: No family/caregiver present to determine baseline cognitive functioning Area of Impairment: Safety/judgement                       Following Commands: Follows one step commands consistently Safety/Judgement: Decreased awareness of deficits;Decreased awareness of safety Awareness: Intellectual Problem Solving: Requires verbal cues;Requires tactile cues          Exercises      General Comments        Pertinent  Vitals/Pain Pain Assessment: Faces Faces Pain Scale: Hurts little more Facial Expression: Relaxed, neutral Body Movements: Absence of movements Muscle Tension: Relaxed Compliance with ventilator (intubated pts.): N/A Vocalization (extubated pts.): N/A CPOT Total: 0 Pain Location: B  feet Pain Descriptors / Indicators: Discomfort;Sore Pain Intervention(s): Monitored during session    Home Living                          Prior Function            PT Goals (current goals can now be found in the care plan section) Acute Rehab PT Goals Patient Stated Goal: return to home and cooking PT Goal Formulation: With patient Time For Goal Achievement: 10/29/21 Potential to Achieve Goals: Good Progress towards PT goals: Progressing toward goals    Frequency    Min 3X/week      PT Plan Discharge plan needs to be updated    Co-evaluation              AM-PAC PT "6 Clicks" Mobility   Outcome Measure  Help needed turning from your back to your side while in a flat bed without using bedrails?: A Little Help needed moving from lying on your back to sitting on the side of a flat bed without using bedrails?: A Little Help needed moving to and from a bed to a chair (including a wheelchair)?: A Little Help needed standing up from a chair using your arms (e.g., wheelchair or bedside chair)?: A Little Help needed to walk in hospital room?: A Little Help needed climbing 3-5 steps with a railing? : A Lot 6 Click Score: 17    End of Session Equipment Utilized During Treatment: Gait belt Activity Tolerance: Patient limited by fatigue Patient left: in bed;with call bell/phone within reach Nurse Communication: Mobility status PT Visit Diagnosis: Other abnormalities of gait and mobility (R26.89);Difficulty in walking, not elsewhere classified (R26.2);Muscle weakness (generalized) (M62.81);Other symptoms and signs involving the nervous system (R29.898)     Time: 1400-1420 PT Time Calculation (min) (ACUTE ONLY): 20 min  Charges:  $Gait Training: 8-22 mins                     Larena Ohnemus, PT, GCS 10/18/21,2:29 PM

## 2021-10-18 NOTE — Progress Notes (Signed)
RT at bedside this AM for morning round breathing tx. RT found pt sleeping on room air with SpO2 of 95%. Pt does have home CPAP at bedside but was not wearing it.

## 2021-10-18 NOTE — Progress Notes (Signed)
Progress Note    Sara Chan  IEP:[id number] DOB: Oct 02, 1971  DOA: 10/06/2021 PCP: Dineen Kid, MD    Brief Narrative:     Medical records reviewed and are as summarized below:  Sara Chan is an 51 y.o. female who is known to have flu approximately 2 months ago and has never returned to her normal pulmonary baseline.  Patient has been experiencing increasing shortness of breath of the last 2 months without fevers chills or sweats.  Presnted to ED. Initially treated with steroids and bronchodilators.  Was going to have CT Chest for further evaluation but became more hypoxemic and hypercapnic reqiuiring bipap and then intubation after receiving some sedation for scan. An arterial line was placed and her BP was over 300.  Has progressed so may be able to go home with home health.      Assessment/Plan:   Principal Problem:   Respiratory failure (HCC)   Acute on chronic hypoxic and hypercapnic respiratory failure Recent influenza A respiratory infection Acute severe exacerbation of asthma with Mucous plugging -Chest x-ray improved but some atelectasis- add incentive spirometry -weaned to RA -home O2 study -Bronchodilators -add Pulmicort   Acute pulmonary edema -diuresed well  HTN -now hypotensive -adjusted meds- monitor BP   Acute kidney injury -resolving   Hypokalemia -replete -BMp in AM  Severe deconditioning -We will consult PT OT- home health?   Diabetes -Continue SSI but change to with meals  Morbid obesity Body mass index is 48.95 kg/m.   Family Communication/Anticipated D/C date and plan/Code Status   DVT prophylaxis: heparin Code Status: Full Code.  Family Communication: at bedside Disposition Plan: Status is: Inpatient  Remains inpatient appropriate because: home health         Medical Consultants:   PCCM     Subjective:   Still with pain in her feet  Objective:    Vitals:   10/18/21 0403 10/18/21 0805 10/18/21 1127  10/18/21 1352  BP: 114/65  101/79   Pulse: (!) 103 89 98 (!) 106  Resp: 14 17 16  (!) 21  Temp: 97.6 F (36.4 C)  97.7 F (36.5 C)   TempSrc: Oral  Oral   SpO2: 95% 95% 94%   Weight: 121.4 kg     Height:        Intake/Output Summary (Last 24 hours) at 10/18/2021 1606 Last data filed at 10/18/2021 1245 Gross per 24 hour  Intake 343 ml  Output 1100 ml  Net -757 ml   Filed Weights   10/16/21 0020 10/17/21 0529 10/18/21 0403  Weight: 122.4 kg 122.9 kg 121.4 kg    Exam:  General: Appearance:    Severely obese female in no acute distress     Lungs:      respirations unlabored  Heart:    Tachycardic.   MS:   All extremities are intact.    Neurologic:   Awake, alert, oriented x 3. No apparent focal neurological           defect.            Data Reviewed:   I have personally reviewed following labs and imaging studies:  Labs: Labs show the following:   Basic Metabolic Panel: Recent Labs  Lab 10/14/21 0535 10/15/21 0434 10/16/21 0417 10/17/21 0417 10/18/21 0138  NA 143 139 138 135 135  K 3.2* 3.3* 3.8 3.7 3.3*  CL 105 102 102 100 96*  CO2 27 27 24 26 24   GLUCOSE 230* 162* 144* 145* 138*  BUN 84* 69* 47* 40* 39*  CREATININE 1.66* 1.26* 1.12* 1.14* 1.26*  CALCIUM 9.2 9.1 9.4 9.3 9.6  MG  --   --  2.3  --   --    GFR Estimated Creatinine Clearance: 64.8 mL/min (A) (by C-G formula based on SCr of 1.26 mg/dL (H)). Liver Function Tests: No results for input(s): AST, ALT, ALKPHOS, BILITOT, PROT, ALBUMIN in the last 168 hours. No results for input(s): LIPASE, AMYLASE in the last 168 hours. No results for input(s): AMMONIA in the last 168 hours. Coagulation profile No results for input(s): INR, PROTIME in the last 168 hours.  CBC: Recent Labs  Lab 10/13/21 0412 10/14/21 0535 10/15/21 0434 10/17/21 0417 10/18/21 0138  WBC 11.3* 9.3 11.1* 9.9 11.0*  HGB 10.0* 10.5* 10.4* 10.6* 10.5*  HCT 30.8* 32.5* 33.4* 33.9* 33.6*  MCV 85.1 85.8 88.4 87.1 86.4  PLT 343  349 346 359 369   Cardiac Enzymes: No results for input(s): CKTOTAL, CKMB, CKMBINDEX, TROPONINI in the last 168 hours. BNP (last 3 results) No results for input(s): PROBNP in the last 8760 hours. CBG: Recent Labs  Lab 10/17/21 1551 10/17/21 2137 10/18/21 0613 10/18/21 1240 10/18/21 1545  GLUCAP 170* 123* 167* 263* 180*   D-Dimer: No results for input(s): DDIMER in the last 72 hours. Hgb A1c: No results for input(s): HGBA1C in the last 72 hours. Lipid Profile: No results for input(s): CHOL, HDL, LDLCALC, TRIG, CHOLHDL, LDLDIRECT in the last 72 hours.  Thyroid function studies: No results for input(s): TSH, T4TOTAL, T3FREE, THYROIDAB in the last 72 hours.  Invalid input(s): FREET3 Anemia work up: Recent Labs    10/16/21 1133  VITAMINB12 775  FERRITIN 682*  TIBC 238*  IRON 67   Sepsis Labs: Recent Labs  Lab 10/14/21 0535 10/15/21 0434 10/17/21 0417 10/18/21 0138  WBC 9.3 11.1* 9.9 11.0*    Microbiology No results found for this or any previous visit (from the past 240 hour(s)).  Procedures and diagnostic studies:  No results found.  Medications:    albuterol  2.5 mg Nebulization TID   budesonide (PULMICORT) nebulizer solution  0.25 mg Nebulization BID   feeding supplement (GLUCERNA SHAKE)  237 mL Oral TID BM   gabapentin  600 mg Oral q morning   gabapentin  900 mg Oral QPM   heparin  5,000 Units Subcutaneous Q8H   [START ON 10/19/2021] losartan  100 mg Oral Daily   And   [START ON 10/19/2021] hydrochlorothiazide  25 mg Oral Daily   insulin aspart  0-15 Units Subcutaneous TID WC   insulin aspart  0-5 Units Subcutaneous QHS   insulin detemir  15 Units Subcutaneous BID   mouth rinse  15 mL Mouth Rinse BID   multivitamin with minerals  1 tablet Oral Daily   Continuous Infusions:     LOS: 12 days   Geradine Girt  [facility]   How to contact the [facility] Attending or Consulting provider [place] or covering provider during after hours [place], for  this patient?  Check the care team in [facility] and look for a) attending/consulting [facility] provider listed and b) the [facility] team listed Log into [email or web address] and use [place]'s universal password to access. If you do not have the password, please contact the hospital operator. Locate the [facility] provider you are looking for under [facility] and page to a number that you can be directly reached. If you still have difficulty reaching the provider, please page the [facility] (  Director on Call) for the Hospitalists listed on amion for assistance.  10/18/2021, 4:06 PM

## 2021-10-19 ENCOUNTER — Other Ambulatory Visit (HOSPITAL_COMMUNITY): Payer: Self-pay

## 2021-10-19 LAB — BASIC METABOLIC PANEL
Anion gap: 11 (ref 5–15)
BUN: 39 mg/dL — ABNORMAL HIGH (ref 6–20)
CO2: 26 mmol/L (ref 22–32)
Calcium: 9.2 mg/dL (ref 8.9–10.3)
Chloride: 99 mmol/L (ref 98–111)
Creatinine, Ser: 1.18 mg/dL — ABNORMAL HIGH (ref 0.44–1.00)
GFR, Estimated: 56 mL/min — ABNORMAL LOW (ref >60)
Glucose, Bld: 183 mg/dL — ABNORMAL HIGH (ref 70–99)
Potassium: 3.7 mmol/L (ref 3.5–5.1)
Sodium: 136 mmol/L (ref 135–145)

## 2021-10-19 LAB — GLUCOSE, CAPILLARY
Glucose-Capillary: 177 mg/dL — ABNORMAL HIGH (ref 70–99)
Glucose-Capillary: 169 mg/dL — ABNORMAL HIGH (ref 70–99)

## 2021-10-19 MED ORDER — ALBUTEROL SULFATE (2.5 MG/3ML) 0.083% IN NEBU
2.5000 mg | INHALATION_SOLUTION | RESPIRATORY_TRACT | 0 refills | Status: DC | PRN
  Filled 2021-10-19: qty 90, 5d supply, fill #0

## 2021-10-19 MED ORDER — BUDESONIDE 0.25 MG/2ML IN SUSP
0.2500 mg | Freq: Two times a day (BID) | RESPIRATORY_TRACT | 0 refills | Status: DC
  Filled 2021-10-19: qty 120, 30d supply, fill #0

## 2021-10-19 MED ORDER — INSULIN DEGLUDEC 100 UNIT/ML ~~LOC~~ SOPN: 20.0000 [IU] | PEN_INJECTOR | Freq: Every day | SUBCUTANEOUS | Status: DC

## 2021-10-19 MED ORDER — SEMGLEE (YFGN) 100 UNIT/ML ~~LOC~~ SOPN: 20.0000 [IU] | PEN_INJECTOR | Freq: Every day | SUBCUTANEOUS | Status: DC

## 2021-10-19 NOTE — Progress Notes (Signed)
PT Cancellation Note  Patient Details Name: Sara Chan MRN: [id number] DOB: 19-Aug-1971   Cancelled Treatment:    Reason Eval/Treat Not Completed: Fatigue/lethargy limiting ability to participate. Patient up in recliner. Reports she is too tired to walk again now, planning to go home today and wants to save some energy.    Sabiha Sura 10/19/2021, 11:04 AM

## 2021-10-19 NOTE — Progress Notes (Signed)
Occupational Therapy Treatment Patient Details Name: Sara Chan MRN: [id number] DOB: 1970/11/17 Today's Date: 10/19/2021   History of present illness 51 yo admitted 12/21 with respiratory difficulty who received sedation for CT scan and required intubation after also with HTN urgency. Extubated 12/29. PMhx: flu in Oct 2022 with pt not recovering to pulmonary baseline, DM, HTN, arthritis   OT comments  Pt presented sitting at EOB and reported they are feeling a lot better. Pt at this time would like to complete rehabilitation at home level with family support. Pt was able to complete started in a standing position but required sitting to complete oral care and face washing at sink level. Pt then was able to complete about 3-5 mins standing tasks with BUE reliant on walker and max HR at 131. Pt was educated in session on compensations to decrease demand of tasks and tasking frequent seat recovery periods. Pt currently with functional limitations due to the deficits listed below (see OT Problem List).  Pt will benefit from skilled OT to increase their safety and independence with ADL and functional mobility for ADL to facilitate discharge to venue listed below.     Recommendations for follow up therapy are one component of a multi-disciplinary discharge planning process, led by the attending physician.  Recommendations may be updated based on patient status, additional functional criteria and insurance authorization.    Follow Up Recommendations  Home health OT    Assistance Recommended at Discharge Frequent or constant Supervision/Assistance  Patient can return home with the following  A little help with walking and/or transfers;A lot of help with bathing/dressing/bathroom;Assistance with cooking/housework;Assist for transportation   Equipment Recommendations  BSC/3in1;Tub/shower seat    Recommendations for Other Services      Precautions / Restrictions Precautions Precautions:  Fall Precaution Comments: B feet sore Restrictions Weight Bearing Restrictions: No       Mobility Bed Mobility Overal bed mobility:  (presented sitting at EOB)                  Transfers Overall transfer level: Needs assistance Equipment used: Rolling walker (2 wheels) Transfers: Sit to/from Stand Sit to Stand: Min guard                 Balance Overall balance assessment: Needs assistance Sitting-balance support: Feet supported Sitting balance-Leahy Scale: Good     Standing balance support: Bilateral upper extremity supported Standing balance-Leahy Scale: Poor Standing balance comment: fair to poor as needed forearms when completion of oral care in standing                           ADL either performed or assessed with clinical judgement   ADL Overall ADL's : Needs assistance/impaired Eating/Feeding: Modified independent;Cueing for safety;Cueing for sequencing;Sitting   Grooming: Wash/dry hands;Wash/dry face;Min guard;Cueing for safety;Cueing for sequencing;Sitting;Standing   Upper Body Bathing: Minimal assistance;Cueing for safety;Cueing for sequencing;Sitting   Lower Body Bathing: Moderate assistance;Cueing for safety;Cueing for sequencing;Sit to/from stand   Upper Body Dressing : Minimal assistance;Cueing for safety;Cueing for sequencing;Sitting   Lower Body Dressing: Moderate assistance;Cueing for safety;Cueing for sequencing;Sit to/from stand   Toilet Transfer: Moderate assistance;Cueing for safety;Cueing for sequencing;Rolling walker (2 wheels);BSC/3in1   Toileting- Clothing Manipulation and Hygiene: Moderate assistance;Cueing for sequencing;Sit to/from stand       Functional mobility during ADLs: Cueing for safety;Cueing for sequencing;Rolling walker (2 wheels);Min guard General ADL Comments: Pt needs cues for positioning due to pain in BLE  Extremity/Trunk Assessment Upper Extremity Assessment Upper Extremity Assessment: RUE  deficits/detail;LUE deficits/detail RUE Deficits / Details: Pt noted increase in control of BUE with ADL tasks and no noted decrease in coordination with tasks LUE Deficits / Details: Pt noted increase in control of BUE with ADL tasks and no noted decrease in coordination with tasks   Lower Extremity Assessment Lower Extremity Assessment: Defer to PT evaluation        Vision       Perception     Praxis      Cognition Arousal/Alertness: Awake/alert Behavior During Therapy: WFL for tasks assessed/performed Overall Cognitive Status: No family/caregiver present to determine baseline cognitive functioning Area of Impairment: Safety/judgement                             Problem Solving: Requires verbal cues General Comments: cues on pacing self with actitiy          Exercises     Shoulder Instructions       General Comments max HR in session 133    Pertinent Vitals/ Pain       Pain Assessment: 0-10 Pain Score: 4  Facial Expression: Relaxed, neutral Body Movements: Absence of movements Muscle Tension: Relaxed Compliance with ventilator (intubated pts.): N/A Vocalization (extubated pts.): N/A CPOT Total: 0 Pain Location: B feet Pain Descriptors / Indicators: Aching;Discomfort Pain Intervention(s): Monitored during session;Limited activity within patient's tolerance  Home Living                                          Prior Functioning/Environment              Frequency  Min 3X/week        Progress Toward Goals  OT Goals(current goals can now be found in the care plan section)  Progress towards OT goals: Progressing toward goals  Acute Rehab OT Goals Patient Stated Goal: to go home for rehab OT Goal Formulation: With patient Time For Goal Achievement: 10/29/21 Potential to Achieve Goals: Good ADL Goals Pt Will Perform Eating: sitting;Independently Pt Will Perform Grooming: with adaptive equipment;sitting;with min  guard assist Pt Will Perform Upper Body Bathing: sitting;with min guard assist Pt Will Transfer to Toilet: with min guard assist;regular height toilet Pt/caregiver will Perform Home Exercise Program: Increased ROM;Increased strength;Right Upper extremity;Left upper extremity;With Supervision;With written HEP provided  Plan Discharge plan needs to be updated    Co-evaluation                 AM-PAC OT "6 Clicks" Daily Activity     Outcome Measure   Help from another person eating meals?: A Little Help from another person taking care of personal grooming?: A Little Help from another person toileting, which includes using toliet, bedpan, or urinal?: A Lot Help from another person bathing (including washing, rinsing, drying)?: A Lot Help from another person to put on and taking off regular upper body clothing?: A Little Help from another person to put on and taking off regular lower body clothing?: A Lot 6 Click Score: 15    End of Session Equipment Utilized During Treatment: Gait belt;Rolling walker (2 wheels)  OT Visit Diagnosis: Unsteadiness on feet (R26.81);Muscle weakness (generalized) (M62.81)   Activity Tolerance Patient limited by fatigue   Patient Left in chair;with call bell/phone within reach;with nursing/sitter in room   Nurse  Communication  (hr)        Time: 9432-0037 OT Time Calculation (min): 32 min  Charges: OT General Charges $OT Visit: 1 Visit OT Treatments $Self Care/Home Management : 23-37 mins  Joeseph Amor OTR/L  Acute Rehab Services  [phone] office number [phone] pager number   Joeseph Amor 10/19/2021, 10:33 AM

## 2021-10-19 NOTE — Discharge Summary (Signed)
Physician Discharge Summary  Sara Chan DGL:[id number] DOB: November 21, 1970 DOA: 10/06/2021  PCP: Dineen Kid, MD  Admit date: 10/06/2021 Discharge date: 10/19/2021  Admitted From: home Discharge disposition: home   Recommendations for Outpatient Follow-Up:   Adjust insulin as an outpatient BMP 1 week Outpatient pulm follow up   Discharge Diagnosis:   Principal Problem:   Respiratory failure [facility])    Discharge Condition: Improved.  Diet recommendation: Low sodium, heart healthy. Carb mod  Wound care: None.  Code status: Full.   History of Present Illness:   This is a 51 yo woman with history of HTN, Obesity and diabetes who had recent influenza A infeciton. Hasn't improved and has had ongoing respiratory symptoms with cough and dyspnea since then. Presnted to ED. Initially treated with steroids and bronchodilators.  Was going to have CT Chest for further evaluation but became more hypoxemic and hypercapnic reqiuiring bipap and then intubation after receiving some sedation for scan. An arterial line was placed and her BP was over 300 SBP. Nitro drip started.      Hospital Course by Problem:   Acute on chronic hypoxic and hypercapnic respiratory failure Recent influenza A respiratory infection Acute severe exacerbation of asthma with Mucous plugging -Chest x-ray improved but some atelectasis- added incentive spirometry/mobilizations -weaned to RA -Bronchodilators/nebulizer   Acute pulmonary edema -diuresed well   HTN -now hypotensive -adjusted meds-continue to adjust as an outpatient   Acute kidney injury -resolved BMP 1 week   Hypokalemia -repleted   Severe deconditioning -PT OT- home health   Diabetes -resume home meds at lower dose-- adjust as able  Morbid obesity Body mass index is 48.95 kg/m. Encourage weight loss    Medical Consultants:   PCCM   Discharge Exam:   Vitals:   10/18/21 2016 10/19/21 0500  BP:  115/62  Pulse:  88   Resp:  18  Temp:  97.8 F (36.6 C)  SpO2: 98% 97%   Vitals:   10/18/21 1655 10/18/21 1937 10/18/21 2016 10/19/21 0500  BP: 101/79 132/77  115/62  Pulse: (!) 105 (!) 103  88  Resp: 20   18  Temp: 97.7 F (36.5 C) 99.1 F (37.3 C)  97.8 F (36.6 C)  TempSrc:  Oral  Oral  SpO2:  98% 98% 97%  Weight:      Height:        General exam: Appears calm and comfortable.    The results of significant diagnostics from this hospitalization (including imaging, microbiology, ancillary and laboratory) are listed below for reference.     Procedures and Diagnostic Studies:   DG CHEST PORT 1 VIEW  Result Date: 10/07/2021 CLINICAL DATA:  Check endotracheal tube placement EXAM: PORTABLE CHEST 1 VIEW COMPARISON:  10/06/2021 FINDINGS: Endotracheal tube is noted in satisfactory position. Gastric catheter extends into the stomach. Cardiac shadow is stable but accentuated by the portable technique. The overall inspiratory effort is poor with crowding of the vascular markings. No focal infiltrate is noted. IMPRESSION: Tubes and lines in satisfactory position. Poor inspiratory effort with crowding of the vascular markings. Electronically Signed   By: Inez Catalina M.D.   On: 10/07/2021 23:53   DG Chest Portable 1 View  Result Date: 10/06/2021 CLINICAL DATA:  Intubation EXAM: PORTABLE CHEST 1 VIEW COMPARISON:  Earlier same day FINDINGS: Endotracheal tube is in the right proximal mainstem bronchus. Withdraw 3 cm to be above the carina. Orogastric or nasogastric tube enters the abdomen. Artifact overlies the chest. The lungs  are clear. IMPRESSION: Endotracheal tube in the proximal right mainstem bronchus. Withdraw 3 cm to be above the carina. Lungs clear. Electronically Signed   By: Nelson Chimes M.D.   On: 10/06/2021 10:32   DG Chest Port 1 View  Result Date: 10/06/2021 CLINICAL DATA:  Shortness of breath. EXAM: PORTABLE CHEST 1 VIEW COMPARISON:  10/05/2021 FINDINGS: The lungs are clear without focal  pneumonia, edema, pneumothorax or pleural effusion. Cardiopericardial silhouette is at upper limits of normal for size. The visualized bony structures of the thorax show no acute abnormality. Telemetry leads overlie the chest. IMPRESSION: No acute cardiopulmonary findings. Electronically Signed   By: Misty Stanley M.D.   On: 10/06/2021 06:51   ECHOCARDIOGRAM COMPLETE  Result Date: 10/06/2021    ECHOCARDIOGRAM REPORT   Patient Name:   Sara Chan Date of Exam: 10/06/2021 Medical Rec #:  [id number]    Height:       62.0 in Accession #:    [id number]   Weight:       277.0 lb Date of Birth:  1971/05/05    BSA:          2.195 m Patient Age:    60 years     BP:           124/58 mmHg Patient Gender: F            HR:           108 bpm. Exam Location:  Inpatient Procedure: 2D Echo Indications:    dyspnea  History:        Patient has no prior history of Echocardiogram examinations.  Sonographer:    Johny Chess RDCS Referring Phys: [place] Comments: Echo performed with patient supine and on artificial respirator and patient is morbidly obese. Image acquisition challenging due to patient body habitus. IMPRESSIONS  1. Left ventricular ejection fraction, by estimation, is 60 to 65%. The left ventricle has normal function. The left ventricle has no regional wall motion abnormalities. Indeterminate diastolic filling due to E-A fusion.  2. Right ventricular systolic function is normal. The right ventricular size is normal. There is normal pulmonary artery systolic pressure.  3. The mitral valve is normal in structure. No evidence of mitral valve regurgitation. No evidence of mitral stenosis.  4. The aortic valve is normal in structure. Aortic valve regurgitation is mild. No aortic stenosis is present.  5. The inferior vena cava is normal in size with greater than 50% respiratory variability, suggesting right atrial pressure of 3 mmHg. FINDINGS  Left Ventricle: Left ventricular ejection fraction,  by estimation, is 60 to 65%. The left ventricle has normal function. The left ventricle has no regional wall motion abnormalities. The left ventricular internal cavity size was normal in size. There is  no left ventricular hypertrophy. Indeterminate diastolic filling due to E-A fusion. Right Ventricle: The right ventricular size is normal. No increase in right ventricular wall thickness. Right ventricular systolic function is normal. There is normal pulmonary artery systolic pressure. The tricuspid regurgitant velocity is 2.50 m/s, and  with an assumed right atrial pressure of 10 mmHg, the estimated right ventricular systolic pressure is 82.9 mmHg. Left Atrium: Left atrial size was normal in size. Right Atrium: Right atrial size was normal in size. Pericardium: There is no evidence of pericardial effusion. Mitral Valve: The mitral valve is normal in structure. No evidence of mitral valve regurgitation. No evidence of mitral valve stenosis. Tricuspid Valve: The tricuspid valve is normal in structure.  Tricuspid valve regurgitation is mild . No evidence of tricuspid stenosis. Aortic Valve: The aortic valve is normal in structure. Aortic valve regurgitation is mild. No aortic stenosis is present. Pulmonic Valve: The pulmonic valve was normal in structure. Pulmonic valve regurgitation is not visualized. No evidence of pulmonic stenosis. Aorta: The aortic root is normal in size and structure. Venous: IVC assessment for right atrial pressure unable to be performed due to mechanical ventilation. The inferior vena cava is normal in size with greater than 50% respiratory variability, suggesting right atrial pressure of 3 mmHg. IAS/Shunts: No atrial level shunt detected by color flow Doppler.  LEFT VENTRICLE PLAX 2D LVOT diam:     1.90 cm   Diastology LV SV:         52        LV e' lateral: 8.81 cm/s LV SV Index:   24 LVOT Area:     2.84 cm  RIGHT VENTRICLE             IVC RV S prime:     15.20 cm/s  IVC diam: 2.30 cm TAPSE  (M-mode): 2.0 cm LEFT ATRIUM             Index        RIGHT ATRIUM           Index LA Vol (A2C):   55.6 ml 25.32 ml/m  RA Area:     13.10 cm LA Vol (A4C):   42.1 ml 19.18 ml/m  RA Volume:   29.60 ml  13.48 ml/m LA Biplane Vol: 50.3 ml 22.91 ml/m  AORTIC VALVE LVOT Vmax:   118.00 cm/s LVOT Vmean:  74.500 cm/s LVOT VTI:    0.185 m  AORTA Ao Root diam: 2.70 cm Ao Asc diam:  3.40 cm TRICUSPID VALVE TR Peak grad:   25.0 mmHg TR Vmax:        250.00 cm/s  SHUNTS Systemic VTI:  0.18 m Systemic Diam: 1.90 cm Mihai Croitoru MD Electronically signed by Sanda Klein MD Signature Date/Time: 10/06/2021/3:05:16 PM    Final      Labs:   Basic Metabolic Panel: Recent Labs  Lab 10/15/21 0434 10/16/21 0417 10/17/21 0417 10/18/21 0138 10/19/21 0357  NA 139 138 135 135 136  K 3.3* 3.8 3.7 3.3* 3.7  CL 102 102 100 96* 99  CO2 27 24 26 24 26   GLUCOSE 162* 144* 145* 138* 183*  BUN 69* 47* 40* 39* 39*  CREATININE 1.26* 1.12* 1.14* 1.26* 1.18*  CALCIUM 9.1 9.4 9.3 9.6 9.2  MG  --  2.3  --   --   --    GFR Estimated Creatinine Clearance: 69.2 mL/min (A) (by C-G formula based on SCr of 1.18 mg/dL (H)). Liver Function Tests: No results for input(s): AST, ALT, ALKPHOS, BILITOT, PROT, ALBUMIN in the last 168 hours. No results for input(s): LIPASE, AMYLASE in the last 168 hours. No results for input(s): AMMONIA in the last 168 hours. Coagulation profile No results for input(s): INR, PROTIME in the last 168 hours.  CBC: Recent Labs  Lab 10/13/21 0412 10/14/21 0535 10/15/21 0434 10/17/21 0417 10/18/21 0138  WBC 11.3* 9.3 11.1* 9.9 11.0*  HGB 10.0* 10.5* 10.4* 10.6* 10.5*  HCT 30.8* 32.5* 33.4* 33.9* 33.6*  MCV 85.1 85.8 88.4 87.1 86.4  PLT 343 349 346 359 369   Cardiac Enzymes: No results for input(s): CKTOTAL, CKMB, CKMBINDEX, TROPONINI in the last 168 hours. BNP: Invalid input(s): POCBNP CBG: Recent Labs  Lab 10/18/21  1240 10/18/21 1545 10/18/21 2049 10/19/21 0623 10/19/21 1113   GLUCAP 263* 180* 126* 177* 169*   D-Dimer No results for input(s): DDIMER in the last 72 hours. Hgb A1c No results for input(s): HGBA1C in the last 72 hours. Lipid Profile No results for input(s): CHOL, HDL, LDLCALC, TRIG, CHOLHDL, LDLDIRECT in the last 72 hours. Thyroid function studies No results for input(s): TSH, T4TOTAL, T3FREE, THYROIDAB in the last 72 hours.  Invalid input(s): FREET3 Anemia work up No results for input(s): VITAMINB12, FOLATE, FERRITIN, TIBC, IRON, RETICCTPCT in the last 72 hours. Microbiology No results found for this or any previous visit (from the past 240 hour(s)).   Discharge Instructions:   Discharge Instructions     Diet - low sodium heart healthy   Complete by: As directed    Diet Carb Modified   Complete by: As directed    Discharge instructions   Complete by: As directed    You have multiple medical issues that require close monitoring: -check you blood sugars at least BID (fasting and 2 hours after a meal)-- bring log to PCP to adjust medicaitons -wear CPAP nightly and while napping -can use albuterol inhaler while out and nebulizer while in your home  Home health   Increase activity slowly   Complete by: As directed       Allergies as of 10/19/2021       Reactions   Codeine Nausea And Vomiting   Flagyl [metronidazole] Hives        Medication List     STOP taking these medications    amLODipine 5 MG tablet Commonly known as: NORVASC   dextromethorphan-guaiFENesin 30-600 MG 12hr tablet Commonly known as: MUCINEX DM   doxycycline 100 MG capsule Commonly known as: VIBRAMYCIN   HYDROcodone-acetaminophen 5-325 MG tablet Commonly known as: NORCO/VICODIN   meloxicam 15 MG tablet Commonly known as: MOBIC   metFORMIN 1000 MG tablet Commonly known as: GLUCOPHAGE   naproxen 500 MG tablet Commonly known as: NAPROSYN   promethazine-dextromethorphan 6.25-15 MG/5ML syrup Commonly known as: PROMETHAZINE-DM       TAKE  these medications    acetaminophen 650 MG CR tablet Commonly known as: TYLENOL Take 650 mg by mouth every 8 (eight) hours as needed for pain.   albuterol 108 (90 Base) MCG/ACT inhaler Commonly known as: VENTOLIN HFA Inhale 2 puffs into the lungs every 6 (six) hours as needed for wheezing or shortness of breath. What changed: Another medication with the same name was added. Make sure you understand how and when to take each.   albuterol (2.5 MG/3ML) 0.083% nebulizer solution Commonly known as: PROVENTIL use 1 vial (2.5 mg total) by nebulization every 2 (two) hours as needed for wheezing or shortness of breath. What changed: You were already taking a medication with the same name, and this prescription was added. Make sure you understand how and when to take each.   amitriptyline 10 MG tablet Commonly known as: ELAVIL Take 10 mg by mouth at bedtime.   benzonatate 200 MG capsule Commonly known as: TESSALON Take 200 mg by mouth 3 (three) times daily as needed for cough.   gabapentin 300 MG capsule Commonly known as: NEURONTIN Take 600-900 mg by mouth See admin instructions. 600mg  oral in the morning 900mg  oral in the evening   guaiFENesin 100 MG/5ML liquid Commonly known as: ROBITUSSIN Take 200 mg by mouth 3 (three) times daily as needed for cough.   Invokamet 50-500 MG Tabs Generic drug: Canagliflozin-metFORMIN HCl Take 1  tablet by mouth daily.   losartan-hydrochlorothiazide 100-25 MG tablet Commonly known as: HYZAAR Take 1 tablet by mouth daily.   pravastatin 40 MG tablet Commonly known as: PRAVACHOL Take 40 mg by mouth daily.   Semglee (yfgn) 100 UNIT/ML Pen Generic drug: insulin glargine-yfgn Inject 20 Units into the skin at bedtime. What changed: how much to take               Durable Medical Equipment  (From admission, onward)           Start     Ordered   10/19/21 1030  For home use only DME 3 n 1  Once        10/19/21 1029   10/19/21 1028  For  home use only DME Walker rolling  Once       Question Answer Comment  Walker: With 5 Inch Wheels   Patient needs a walker to treat with the following condition Weakness      10/19/21 1028   10/18/21 1603  For home use only DME Nebulizer machine  Once       Question Answer Comment  Patient needs a nebulizer to treat with the following condition Wheezing   Length of Need Lifetime      10/18/21 1603            Follow-up Information     Via, Lennette Bihari, MD .   Specialty: Family Medicine Why: THe patient is going to make appointment. Contact information: [place] [address] [phone]                  Time coordinating discharge: 35 min  Signed:  Geradine Girt DO  [facility] 10/19/2021, 12:07 PM

## 2021-10-19 NOTE — TOC Progression Note (Addendum)
Transition of Care [facility]) - Progression Note    Patient Details  Name: Sara Chan MRN: [id number] Date of Birth: Jun 26, 1971  [facility]) CM/SW Contact  Zenon Mayo, RN Phone Number: 10/19/2021, 12:08 PM  Clinical Narrative:    Patient has insurance that will only let her have agency that is in network with [facility] .  NCM made referral to [facility] with [employer] for rolling walker, 3 n 1, and neb machine.  [facility] will have this delivered to the room. NCM offered choice to patient for HHPT, [place], she states she does not have a preference.  NCM made referral to [facility] with [facility], awaiting to hear back.  Tommi Rumps can not take referral.  NCM notified patient, NCM asked if she would like to do outpatient physical therapy and outpatient occupational therapy, she states yes that will be fine.  NCM gave her a script from the MD and faxed it over to [facility] [phone],  phone is [phone].         Expected Discharge Plan and Services           Expected Discharge Date: 10/19/21                                     Social Determinants of Health (SDOH) Interventions    Readmission Risk Interventions No flowsheet data found.

## 2021-10-19 NOTE — Progress Notes (Signed)
Inpatient Diabetes Program Recommendations  AACE/ADA: New Consensus Statement on Inpatient Glycemic Control (2015)  Target Ranges:  Prepandial:   less than 140 mg/dL      Peak postprandial:   less than 180 mg/dL (1-2 hours)      Critically ill patients:  140 - 180 mg/dL   Lab Results  Component Value Date   GLUCAP 177 (H) 10/19/2021   HGBA1C 9.9 (H) 10/06/2021    Review of Glycemic Control  Diabetes history: DM2 Outpatient Diabetes medications: Metformin 1000 mg BID, Tresiba 40 units QD Current orders for Inpatient glycemic control: Levemir 15 units BID, Novolog 0-15 units TID and 0-5 qhs  Spoke with patient at bedside.  Reviewed patient's current A1c of 909% (average blood sugar of 237 mg/dl).  Explained what a A1c is and what it measures. Also reviewed goal A1c with patient, importance of good glucose control @ home, and blood sugar goals.  She confirms above home medications.  Denies difficulty obtaining medications and does not skip doses.    Current with PCP, Eldridge Abrahams at [facility].  She does not check her CBG regularly but states she will start.  Denies hypoglycemia and is aware of signs, symptoms and treatment.    Drinks Zero sugar beverages.  Educated on The Plate Method, portion control, CHO's, impact of exercise on blood glucose.   Will continue to follow while inpatient.  Thank you, Reche Dixon, MSN, RN Diabetes Coordinator Inpatient Diabetes Program [phone] (team pager from 8a-5p)

## 2021-11-04 ENCOUNTER — Other Ambulatory Visit: Payer: Self-pay

## 2021-11-04 ENCOUNTER — Encounter (HOSPITAL_COMMUNITY): Payer: Self-pay

## 2021-11-04 ENCOUNTER — Observation Stay (HOSPITAL_COMMUNITY)
Admission: EM | Admit: 2021-11-04 | Discharge: 2021-11-05 | Disposition: A | Discharge status: Home / Self Care | Payer: BLUE CROSS/BLUE SHIELD | Attending: Internal Medicine | Admitting: Internal Medicine

## 2021-11-04 DIAGNOSIS — Z79899 Other long term (current) drug therapy: Secondary | ICD-10-CM | POA: Insufficient documentation

## 2021-11-04 DIAGNOSIS — E1169 Type 2 diabetes mellitus with other specified complication: Secondary | ICD-10-CM | POA: Insufficient documentation

## 2021-11-04 DIAGNOSIS — E119 Type 2 diabetes mellitus without complications: Secondary | ICD-10-CM

## 2021-11-04 DIAGNOSIS — Z7984 Long term (current) use of oral hypoglycemic drugs: Secondary | ICD-10-CM | POA: Diagnosis not present

## 2021-11-04 DIAGNOSIS — J4550 Severe persistent asthma, uncomplicated: Secondary | ICD-10-CM | POA: Diagnosis not present

## 2021-11-04 DIAGNOSIS — R0602 Shortness of breath: Secondary | ICD-10-CM | POA: Diagnosis present

## 2021-11-04 DIAGNOSIS — Z20822 Contact with and (suspected) exposure to covid-19: Secondary | ICD-10-CM | POA: Insufficient documentation

## 2021-11-04 DIAGNOSIS — Z794 Long term (current) use of insulin: Secondary | ICD-10-CM | POA: Insufficient documentation

## 2021-11-04 DIAGNOSIS — I10 Essential (primary) hypertension: Secondary | ICD-10-CM | POA: Insufficient documentation

## 2021-11-04 DIAGNOSIS — G4733 Obstructive sleep apnea (adult) (pediatric): Secondary | ICD-10-CM

## 2021-11-04 DIAGNOSIS — Z87891 Personal history of nicotine dependence: Secondary | ICD-10-CM | POA: Insufficient documentation

## 2021-11-04 DIAGNOSIS — I2693 Single subsegmental pulmonary embolism without acute cor pulmonale: Secondary | ICD-10-CM | POA: Diagnosis not present

## 2021-11-04 DIAGNOSIS — E1159 Type 2 diabetes mellitus with other circulatory complications: Secondary | ICD-10-CM | POA: Diagnosis present

## 2021-11-04 DIAGNOSIS — E669 Obesity, unspecified: Secondary | ICD-10-CM | POA: Diagnosis not present

## 2021-11-04 DIAGNOSIS — E785 Hyperlipidemia, unspecified: Secondary | ICD-10-CM | POA: Diagnosis not present

## 2021-11-04 DIAGNOSIS — I2699 Other pulmonary embolism without acute cor pulmonale: Secondary | ICD-10-CM | POA: Diagnosis present

## 2021-11-04 DIAGNOSIS — Z9989 Dependence on other enabling machines and devices: Secondary | ICD-10-CM

## 2021-11-04 HISTORY — DX: Obstructive sleep apnea (adult) (pediatric): G47.33

## 2021-11-04 HISTORY — DX: Type 2 diabetes mellitus without complications: E11.9

## 2021-11-04 HISTORY — DX: Type 2 diabetes mellitus with other circulatory complications: E11.59

## 2021-11-04 HISTORY — DX: Type 2 diabetes mellitus with other specified complication: E11.69

## 2021-11-04 LAB — RESP PANEL BY RT-PCR (FLU A&B, COVID) ARPGX2
Influenza A by PCR: NEGATIVE
Influenza B by PCR: NEGATIVE
SARS Coronavirus 2 by RT PCR: NEGATIVE

## 2021-11-04 LAB — CBC WITH DIFFERENTIAL/PLATELET
Abs Immature Granulocytes: 0.01 K/uL (ref 0.00–0.07)
Basophils Absolute: 0 K/uL (ref 0.0–0.1)
Basophils Relative: 0 %
Eosinophils Absolute: 1 K/uL — ABNORMAL HIGH (ref 0.0–0.5)
Eosinophils Relative: 14 %
HCT: 37.2 % (ref 36.0–46.0)
Hemoglobin: 11.8 g/dL — ABNORMAL LOW (ref 12.0–15.0)
Immature Granulocytes: 0 %
Lymphocytes Relative: 34 %
Lymphs Abs: 2.3 K/uL (ref 0.7–4.0)
MCH: 27.2 pg (ref 26.0–34.0)
MCHC: 31.7 g/dL (ref 30.0–36.0)
MCV: 85.7 fL (ref 80.0–100.0)
Monocytes Absolute: 0.5 K/uL (ref 0.1–1.0)
Monocytes Relative: 8 %
Neutro Abs: 2.9 K/uL (ref 1.7–7.7)
Neutrophils Relative %: 44 %
Platelets: 299 K/uL (ref 150–400)
RBC: 4.34 MIL/uL (ref 3.87–5.11)
RDW: 14.3 % (ref 11.5–15.5)
WBC: 6.7 K/uL (ref 4.0–10.5)
nRBC: 0 % (ref 0.0–0.2)

## 2021-11-04 LAB — BASIC METABOLIC PANEL WITH GFR
Anion gap: 13 (ref 5–15)
BUN: 9 mg/dL (ref 6–20)
CO2: 27 mmol/L (ref 22–32)
Calcium: 9.4 mg/dL (ref 8.9–10.3)
Chloride: 96 mmol/L — ABNORMAL LOW (ref 98–111)
Creatinine, Ser: 0.99 mg/dL (ref 0.44–1.00)
GFR, Estimated: >60 mL/min
Glucose, Bld: 170 mg/dL — ABNORMAL HIGH (ref 70–99)
Potassium: 3.7 mmol/L (ref 3.5–5.1)
Sodium: 136 mmol/L (ref 135–145)

## 2021-11-04 LAB — TROPONIN I (HIGH SENSITIVITY)
Troponin I (High Sensitivity): 11 ng/L (ref <18)
Troponin I (High Sensitivity): 13 ng/L

## 2021-11-04 LAB — BRAIN NATRIURETIC PEPTIDE: B Natriuretic Peptide: 14.8 pg/mL (ref 0.0–100.0)

## 2021-11-04 MED ORDER — BENZONATATE 100 MG PO CAPS
100.0000 mg | ORAL_CAPSULE | Freq: Once | ORAL | Status: AC
  Administered 2021-11-04: 100 mg via ORAL
  Filled 2021-11-04: qty 1

## 2021-11-04 MED ORDER — HEPARIN (PORCINE) 25000 UT/250ML-% IV SOLN
1250.0000 [IU]/h | INTRAVENOUS | Status: DC
  Administered 2021-11-04: 1250 [IU]/h via INTRAVENOUS
  Filled 2021-11-04: qty 250

## 2021-11-04 MED ORDER — HEPARIN BOLUS VIA INFUSION
4450.0000 [IU] | Freq: Once | INTRAVENOUS | Status: AC
  Administered 2021-11-04: 4450 [IU] via INTRAVENOUS
  Filled 2021-11-04: qty 4450

## 2021-11-04 MED ORDER — ACETAMINOPHEN 500 MG PO TABS
1000.0000 mg | ORAL_TABLET | Freq: Once | ORAL | Status: AC
  Administered 2021-11-04: 1000 mg via ORAL
  Filled 2021-11-04: qty 2

## 2021-11-04 NOTE — Progress Notes (Signed)
ANTICOAGULATION CONSULT NOTE - Initial Consult  Pharmacy Consult for IV heparin Indication: pulmonary embolus  Allergies  Allergen Reactions   Codeine Nausea And Vomiting   Flagyl [Metronidazole] Hives    Patient Measurements: Height: 5' (152.4 cm) Weight: 112.5 kg (248 lb) IBW/kg (Calculated) : 45.5 Heparin Dosing Weight: 73.6  Vital Signs: Temp: 99 F (37.2 C) (01/19 1825) Temp Source: Oral (01/19 1825) BP: 122/85 (01/19 2021) Pulse Rate: 123 (01/19 2021)  Labs: Recent Labs    11/04/21 1836  HGB 11.8*  HCT 37.2  PLT 299  CREATININE 0.99  TROPONINIHS 11    Estimated Creatinine Clearance: 75 mL/min (by C-G formula based on SCr of 0.99 mg/dL).   Medical History: Past Medical History:  Diagnosis Date   Arthritis    Diabetes mellitus without complication ([place])    Hypertension    Assessment: 53 YOF presenting to the ED with shortness of breath after being advised to come to hospital from outside clinic for PE. CT angio positive for nonocclusive thrombus in the right upper lobe segmental branch.  No history of anticoagulation prior to admission. Pharmacy to dose IV heparin.   Hgb 11.8 and decreased but appears to be stable and at baseline for her, platelets are within normal limits. Renal function appears to be at baseline.   Goal of Therapy:  Heparin level 0.3-0.7 units/ml Monitor platelets by anticoagulation protocol: Yes   Plan:  IV heparin 4450 unit bolus x1 Start IV heparin gtt @ 1250 units/h 6h heparin level check Daily heparin level, CBC Monitor for signs and symptoms of bleeding Follow-up long-term AC plan  Thank you for involving pharmacy in this patient's care.  Elita Quick, PharmD PGY1 Ambulatory Care Pharmacy Resident 11/04/2021 9:30 PM  **Pharmacist phone directory can be found on [email or web address] listed under [place]**

## 2021-11-04 NOTE — H&P (Signed)
History and Physical    Darl Brisbin ALP:[id number] DOB: 09-12-1971 DOA: 11/04/2021  PCP: Dineen Kid, MD  Patient coming from: Home  I have personally briefly reviewed patient's old medical records in [place]  Chief Complaint: PE  HPI: Sara Chan is a 51 y.o. female with medical history significant for asthma, T2DM, HTN, HLD, obesity, OSA on CPAP who presented to the ED for evaluation of PE.  Patient recently admitted 10/06/2021-10/19/2021 for hypoxic respiratory failure due to severe asthma exacerbation with mucous plugging and pulmonary edema.  She was initially intubated and admitted to the ICU.  She was extubated 12/29 and ultimately discharged on room air.  Patient states she was doing well until few days ago when she developed frequent nonproductive cough with chest congestion.  She has had some shortness of breath which she has been managing with her home nebulizers.  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  She called her PCP who arranged for her to undergo right lower extremity venous ultrasound and CTA chest.  RLE ultrasound was negative for evidence of DVT but limited study.  CTA chest was positive for nonocclusive thrombus in the right upper lobe segmental branch.  Dilated main pulmonary artery noted.  Images and report available in PACS. Patient was advised to come to the ED for further evaluation.  ED Course:  Initial vitals showed BP 132/79, pulse 128, RR 18, temp 9 9.0 F, SPO2 95% on room air.  Labs show WBC 6.7, hemoglobin 11.8, platelets 290,000, sodium 136, potassium 3.7, bicarb 27, BUN 9, creatinine 0.99, serum glucose 170, troponin 11 > 13, BNP 14.8.  SARS-CoV-2 and influenza PCR negative.  Patient was started on IV heparin and the hospitalist service was consulted to admit for further evaluation and management.  Review of Systems: All  systems reviewed and are negative except as documented in history of present illness above.   Past Medical History:  Diagnosis Date   Arthritis    Diabetes mellitus without complication ([place])    Hyperlipidemia associated with type 2 diabetes mellitus ([place])    Hypertension    Hypertension associated with diabetes ([place])    OSA on CPAP    Type 2 diabetes mellitus ([place])     Past Surgical History:  Procedure Laterality Date   ADENOIDECTOMY     BREAST REDUCTION SURGERY     CESAREAN SECTION     NASAL SEPTUM SURGERY     NOVASURE ABLATION     TONSILLECTOMY      Social History:  reports that she quit smoking about 7 years ago. She has never used smokeless tobacco. She reports that she does not drink alcohol and does not use drugs.  Allergies  Allergen Reactions   Codeine Nausea And Vomiting   Flagyl [Metronidazole] Hives    History reviewed. No pertinent family history.   Prior to Admission medications   Medication Sig Start Date End Date Taking? Authorizing Provider  acetaminophen (TYLENOL) 650 MG CR tablet Take 650 mg by mouth every 8 (eight) hours as needed for pain.   Yes [provider]  albuterol (VENTOLIN HFA) 108 (90 Base) MCG/ACT inhaler Inhale 2 puffs into the lungs every 6 (six) hours as needed for wheezing or shortness of breath. 10/05/21  Yes [provider]  amitriptyline (ELAVIL) 10 MG tablet Take 10 mg by mouth at bedtime. 10/05/21  Yes [provider]  benzonatate (TESSALON) 200 MG capsule Take 200 mg by mouth 3 (three) times daily as needed for cough. 10/05/21  Yes [provider]  BIOTIN PO Take 1 capsule by mouth daily.   Yes [provider]  cyanocobalamin 1000 MCG tablet Take 1 tablet by mouth daily.   Yes [provider]  gabapentin (NEURONTIN) 300 MG capsule Take 300 mg by mouth See admin instructions. 900 mg in the morning. 600 mg in the afternoon. 900 mg at bedtime 09/14/21  Yes [provider]   guaiFENesin (ROBITUSSIN) 100 MG/5ML liquid Take 200 mg by mouth 3 (three) times daily as needed for cough.   Yes [provider]  Insulin Glargine (SEMGLEE [place]) Inject 20 Units into the skin daily.   Yes [provider]  ipratropium-albuterol (DUONEB) 0.5-2.5 (3) MG/3ML SOLN Inhale 3 mLs into the lungs as needed. 11/04/21  Yes [provider]  losartan-hydrochlorothiazide (HYZAAR) 100-25 MG tablet Take 1 tablet by mouth daily. 06/02/21  Yes [provider]  metFORMIN (GLUCOPHAGE) 1000 MG tablet Take 500 mg by mouth 2 (two) times daily. 11/02/21  Yes [provider]  methocarbamol (ROBAXIN) 500 MG tablet Take 500 mg by mouth 3 (three) times daily as needed for muscle spasms. 08/02/21  Yes [provider]  metoprolol succinate (TOPROL-XL) 25 MG 24 hr tablet Take 25 mg by mouth daily. 06/29/21  Yes [provider]  pravastatin (PRAVACHOL) 40 MG tablet Take 40 mg by mouth daily.  12/01/16  Yes [provider]  insulin degludec (TRESIBA) 100 UNIT/ML FlexTouch Pen Inject 20 Units into the skin daily. Patient not taking: Reported on 11/04/2021 10/19/21   Geradine Girt, DO    Physical Exam: Vitals:   11/04/21 2145 11/04/21 2200 11/04/21 2230 11/05/21 0015  BP: 131/73 125/63 (!) 131/59 130/66  Pulse: (!) 124 (!) 118 (!) 118 (!) 114  Resp: (!) 21 (!) 23 (!) 27 18  Temp:      TempSrc:      SpO2: 97% 91% 98% 98%  Weight:      Height:       Constitutional: Obese woman resting in bed, NAD, calm, comfortable Eyes: PERRL, lids and conjunctivae normal ENMT: Mucous membranes are moist. Posterior pharynx clear of any exudate or lesions.Normal dentition.  Neck: normal, supple, no masses. Respiratory: Distant breath sounds with faint end expiratory wheezing. Normal respiratory effort. No accessory muscle use.  Cardiovascular: Tachycardic, no murmurs / rubs / gallops. No extremity edema. 2+ pedal pulses. Abdomen: no tenderness, no masses  palpated. No hepatosplenomegaly. Bowel sounds positive.  Musculoskeletal: no clubbing / cyanosis. No joint deformity upper and lower extremities. Good ROM, no contractures. Normal muscle tone.  Skin: no rashes, lesions, ulcers. No induration Neurologic: CN 2-12 grossly intact. Sensation intact. Strength 5/5 in all 4.  Psychiatric: Normal judgment and insight. Alert and oriented x 3. Normal mood.   Labs on Admission: I have personally reviewed following labs and imaging studies  CBC: Recent Labs  Lab 11/04/21 1836  WBC 6.7  NEUTROABS 2.9  HGB 11.8*  HCT 37.2  MCV 85.7  PLT 500   Basic Metabolic Panel: Recent Labs  Lab 11/04/21 1836  NA 136  K 3.7  CL 96*  CO2 27  GLUCOSE 170*  BUN 9  CREATININE 0.99  CALCIUM 9.4   GFR: Estimated Creatinine Clearance: 75 mL/min (by C-G formula based on SCr of 0.99 mg/dL). Liver Function Tests: No results for input(s): AST, ALT, ALKPHOS, BILITOT, PROT, ALBUMIN in the  last 168 hours. No results for input(s): LIPASE, AMYLASE in the last 168 hours. No results for input(s): AMMONIA in the last 168 hours. Coagulation Profile: No results for input(s): INR, PROTIME in the last 168 hours. Cardiac Enzymes: No results for input(s): CKTOTAL, CKMB, CKMBINDEX, TROPONINI in the last 168 hours. BNP (last 3 results) No results for input(s): PROBNP in the last 8760 hours. HbA1C: No results for input(s): HGBA1C in the last 72 hours. CBG: No results for input(s): GLUCAP in the last 168 hours. Lipid Profile: No results for input(s): CHOL, HDL, LDLCALC, TRIG, CHOLHDL, LDLDIRECT in the last 72 hours. Thyroid Function Tests: No results for input(s): TSH, T4TOTAL, FREET4, T3FREE, THYROIDAB in the last 72 hours. Anemia Panel: No results for input(s): VITAMINB12, FOLATE, FERRITIN, TIBC, IRON, RETICCTPCT in the last 72 hours. Urine analysis:    Component Value Date/Time   COLORURINE STRAW (A) 10/06/2021 0949   APPEARANCEUR CLEAR 10/06/2021 0949   LABSPEC  1.029 10/06/2021 0949   PHURINE 5.0 10/06/2021 0949   GLUCOSEU >=500 (A) 10/06/2021 0949   HGBUR NEGATIVE 10/06/2021 0949   BILIRUBINUR NEGATIVE 10/06/2021 0949   KETONESUR 20 (A) 10/06/2021 0949   PROTEINUR 100 (A) 10/06/2021 0949   UROBILINOGEN 1.0 12/13/2014 2340   NITRITE NEGATIVE 10/06/2021 0949   LEUKOCYTESUR NEGATIVE 10/06/2021 0949    Radiological Exams on Admission: CLINICAL DATA: Shortness of breath. Respiratory failure.  EXAM: CT ANGIOGRAPHY CHEST WITH CONTRAST  TECHNIQUE: Multidetector CT imaging of the chest was performed using the standard protocol during bolus administration of intravenous contrast. Multiplanar CT image reconstructions and MIPs were obtained to evaluate the vascular anatomy.  RADIATION DOSE REDUCTION: This exam was performed according to the departmental dose-optimization program which includes automated exposure control, adjustment of the mA and/or kV according to patient size and/or use of iterative reconstruction technique.  CONTRAST: 100 mL Omnipaque 350  COMPARISON: None.  FINDINGS: Cardiovascular: Satisfactory opacification of the pulmonary arteries to the segmental level. Nonocclusive thrombus in the right upper lobe segmental branch. Normal heart size. No pericardial effusion. Dilated main pulmonary artery measuring 3.5 cm. Coronary artery atherosclerosis. Thoracic aortic atherosclerosis.  Mediastinum/Nodes: No enlarged mediastinal, hilar, or axillary lymph nodes. Thyroid gland, trachea, and esophagus demonstrate no significant findings.  Lungs/Pleura: Lungs are clear. No pleural effusion or pneumothorax.  Upper Abdomen: No acute abnormality.  Musculoskeletal: No chest wall abnormality. No acute or significant osseous findings.  Review of the MIP images confirms the above findings.  IMPRESSION: 1. Nonocclusive thrombus in the right upper lobe segmental branch. 2. Dilated main pulmonary artery which can be seen in the  setting of pulmonary arterial hypertension. 3. Aortic Atherosclerosis (ICD10-I70.0).  Critical Value/emergent results were called by telephone at the time of interpretation on 11/04/2021 at 4:11 pm to provider [facility] , who verbally acknowledged these results.  Electronically Signed By: Kathreen Devoid M.D. On: 11/04/2021 16:11   EKG: Personally reviewed. Sinus tachycardia, rate 130, no acute ischemic changes.  Rate was faster on prior EKG.  Assessment/Plan Principal Problem:   Acute pulmonary embolism without acute cor pulmonale (HCC) Active Problems:   Asthma in adult, severe persistent, uncomplicated   Type 2 diabetes mellitus ([place])   Hypertension associated with diabetes ([place])   Hyperlipidemia associated with type 2 diabetes mellitus ([place])   OSA on CPAP   Brianny Soulliere is a 51 y.o. female with medical history significant for asthma, T2DM, HTN, HLD, obesity, OSA on CPAP who is admitted with acute PE.  * Acute pulmonary embolism without acute cor  pulmonale ([place])- (present on admission) Nonocclusive PE right upper lobe segmental branch seen on outpatient CTA chest.  Patient remains tachycardic but otherwise hemodynamically stable, no evidence of right heart strain.  Suspect provoked in setting of recent hospitalization with ICU stay. -Continue IV heparin -Can potentially transition to [place] and discharge tomorrow  Asthma in adult, severe persistent, uncomplicated- (present on admission) Currently stable.  Continue DuoNeb and albuterol as needed.  OSA on CPAP Continue CPAP nightly.  Hyperlipidemia associated with type 2 diabetes mellitus ([place])- (present on admission) Continue pravastatin.  Hypertension associated with diabetes ([place])- (present on admission) Continue Toprol-XL, losartan, HCTZ.  Type 2 diabetes mellitus (HCC) Hold metformin, continue insulin Semglee 20 units daily and SSI.   DVT prophylaxis: IV heparin  Code Status: Full code, confirmed with patient on  admission Family Communication: Discussed with patient, she has discussed with family Disposition Plan: From home and likely discharged home pending clinical progress Consults called: None Level of care: Telemetry Cardiac Admission status:  Status is: Observation  The patient remains OBS appropriate and will d/c before 2 midnights.  Zada Finders MD [facility]  If 7PM-7AM, please contact night-coverage [email or web address]  11/05/2021, 1:41 AM

## 2021-11-04 NOTE — ED Provider Notes (Signed)
[facility] EMERGENCY DEPARTMENT Provider Note   CSN: [id number] Arrival date & time: 11/04/21  1729     History  Chief Complaint  Patient presents with   Shortness of Breath    Sara Chan is a 51 y.o. female.  The history is provided by the patient, medical records and the spouse. No language interpreter was used.  Shortness of Breath    This is a 51 year old female significant history of obesity, hypertension, previously admitted to the hospital from 12/21 and discharged on 10/19/2021 due to acute respiratory failure in the setting of influenza A infection.  During hospital course, she was requiring BiPAP and then intubation after receiving sedation for scan.  Patient was subsequently discharged home after she was doing better, however, for the past few days she report her cough returns with increased shortness of breath as well as having pain to her right leg.  She was seen by her primary care provider for her complaint today and she underwent a venous Doppler ultrasound of her right lower extremity as well as a chest CT angiogram.  She was noted to have a blood clot in her lung and was recommended to come to the ER for further assessment.  She still endorse persistent coughing but denies hemoptysis.  She endorsed shortness of breath, she denies fever, nausea vomiting diarrhea abdominal pain.    Home Medications Prior to Admission medications   Medication Sig Start Date End Date Taking? Authorizing Provider  acetaminophen (TYLENOL) 650 MG CR tablet Take 650 mg by mouth every 8 (eight) hours as needed for pain.    [provider]  albuterol (PROVENTIL) (2.5 MG/3ML) 0.083% nebulizer solution use 1 vial (2.5 mg total) by nebulization every 2 (two) hours as needed for wheezing or shortness of breath. 10/19/21   Geradine Girt, DO  albuterol (VENTOLIN HFA) 108 (90 Base) MCG/ACT inhaler Inhale 2 puffs into the lungs every 6 (six) hours as needed for wheezing or  shortness of breath. 10/05/21   [provider]  amitriptyline (ELAVIL) 10 MG tablet Take 10 mg by mouth at bedtime. 10/05/21   [provider]  benzonatate (TESSALON) 200 MG capsule Take 200 mg by mouth 3 (three) times daily as needed for cough. 10/05/21   [provider]  Canagliflozin-metFORMIN HCl (INVOKAMET) 50-500 MG TABS Take 1 tablet by mouth daily.    [provider]  gabapentin (NEURONTIN) 300 MG capsule Take 600-900 mg by mouth See admin instructions. 600mg  oral in the morning 900mg  oral in the evening 09/14/21   [provider]  guaiFENesin (ROBITUSSIN) 100 MG/5ML liquid Take 200 mg by mouth 3 (three) times daily as needed for cough.    [provider]  insulin degludec (TRESIBA) 100 UNIT/ML FlexTouch Pen Inject 20 Units into the skin daily. 10/19/21   Geradine Girt, DO  losartan-hydrochlorothiazide (HYZAAR) 100-25 MG tablet Take 1 tablet by mouth daily. 06/02/21   [provider]  pravastatin (PRAVACHOL) 40 MG tablet Take 40 mg by mouth daily.  12/01/16   [provider]      Allergies    Codeine and Flagyl [metronidazole]    Review of Systems   Review of Systems  Respiratory:  Positive for shortness of breath.   All other systems reviewed and are negative.  Physical Exam Updated Vital Signs BP 122/85 (BP Location: Right Arm)    Pulse (!) 123    Temp 99 F (37.2 C) (Oral)    Resp 15  Ht 5' (1.524 m)    Wt 112.5 kg    SpO2 94%    BMI 48.43 kg/m  Physical Exam Vitals and nursing note reviewed.  Constitutional:      General: She is not in acute distress.    Appearance: She is well-developed. She is obese.  HENT:     Head: Atraumatic.  Eyes:     Conjunctiva/sclera: Conjunctivae normal.  Cardiovascular:     Rate and Rhythm: Tachycardia present.  Pulmonary:     Effort: Pulmonary effort is normal.     Breath sounds: No decreased breath sounds, wheezing, rhonchi or rales.  Musculoskeletal:     Cervical  back: Neck supple.     Right lower leg: Tenderness (Tenderness to palpation of right lower extremity around the calf region but no significant edema erythema or warmth appreciated.  Intact DP pulse.) present.  Skin:    Findings: No rash.  Neurological:     Mental Status: She is alert and oriented to person, place, and time.  Psychiatric:        Mood and Affect: Mood normal.    ED Results / Procedures / Treatments   Labs (all labs ordered are listed, but only abnormal results are displayed) Labs Reviewed  CBC WITH DIFFERENTIAL/PLATELET - Abnormal; Notable for the following components:      Result Value   Hemoglobin 11.8 (*)    Eosinophils Absolute 1.0 (*)    All other components within normal limits  BASIC METABOLIC PANEL - Abnormal; Notable for the following components:   Chloride 96 (*)    Glucose, Bld 170 (*)    All other components within normal limits  RESP PANEL BY RT-PCR (FLU A&B, COVID) [id number]  BRAIN NATRIURETIC PEPTIDE  TROPONIN I (HIGH SENSITIVITY)  TROPONIN I (HIGH SENSITIVITY)    EKG EKG Interpretation  Date/Time:  Thursday November 04 2021 18:25:46 EST Ventricular Rate:  130 PR Interval:  164 QRS Duration: 72 QT Interval:  266 QTC Calculation: 391 R Axis:   38 Text Interpretation: Sinus tachycardia Low voltage QRS Cannot rule out Anterior infarct , age undetermined Abnormal ECG When compared with ECG of 08-Oct-2021 01:52, PREVIOUS ECG IS PRESENT When compared to prior, similar rachycardia. . No STEMI Confirmed by Antony Blackbird [phone]) on 11/04/2021 8:44:39 PM  Radiology No results found. Joaquin Courts, MD - 11/04/2021  Formatting of this note might be different from the original.  CLINICAL DATA:  Shortness of breath. Respiratory failure.   EXAM:  CT ANGIOGRAPHY CHEST WITH CONTRAST   TECHNIQUE:  Multidetector CT imaging of the chest was performed using the  standard protocol during bolus administration of intravenous  contrast. Multiplanar CT image  reconstructions and MIPs were  obtained to evaluate the vascular anatomy.   RADIATION DOSE REDUCTION: This exam was performed according to the  departmental dose-optimization program which includes automated  exposure control, adjustment of the mA and/or kV according to  patient size and/or use of iterative reconstruction technique.   CONTRAST:  100 mL Omnipaque 350   COMPARISON:  None.   FINDINGS:  Cardiovascular: Satisfactory opacification of the pulmonary arteries  to the segmental level. Nonocclusive thrombus in the right upper  lobe segmental branch. Normal heart size. No pericardial effusion.  Dilated main pulmonary artery measuring 3.5 cm. Coronary artery  atherosclerosis. Thoracic aortic atherosclerosis.   Mediastinum/Nodes: No enlarged mediastinal, hilar, or axillary lymph  nodes. Thyroid gland, trachea, and esophagus demonstrate no  significant findings.   Lungs/Pleura: Lungs are clear. No pleural  effusion or pneumothorax.   Upper Abdomen: No acute abnormality.   Musculoskeletal: No chest wall abnormality. No acute or significant  osseous findings.   Review of the MIP images confirms the above findings.   IMPRESSION:  1. Nonocclusive thrombus in the right upper lobe segmental branch.  2. Dilated main pulmonary artery which can be seen in the setting of  pulmonary arterial hypertension.  3. Aortic Atherosclerosis (ICD10-I70.0).   Critical Value/emergent results were called by telephone at the time  of interpretation on 11/04/2021 at 4:11 pm to provider [facility] ,  who verbally acknowledged these results.    Electronically Signed    By: Kathreen Devoid M.D.    On: 11/04/2021 16:11 Exam End: 11/04/21 15:49   Specimen Collected: 11/04/21 15:54 Last Resulted: 11/04/21 16:11  Received From: [facility]   [place] LOWER EXTREMITY RIGHT VENOUS DUPLEX  Anatomical Region Laterality Modality  Vascular -- Ultrasound  Thigh -- --  Leg -- --    Narrative  CLINICAL DATA:  Right calf pain   EXAM:  RIGHT LOWER EXTREMITY VENOUS DOPPLER ULTRASOUND   TECHNIQUE:  Gray-scale sonography with graded compression, as well as color  Doppler and duplex ultrasound were performed to evaluate the lower  extremity deep venous systems from the level of the common femoral  vein and including the common femoral, femoral, profunda femoral,  popliteal and calf veins including the posterior tibial, peroneal  and gastrocnemius veins when visible. The superficial great  saphenous vein was also interrogated. Spectral Doppler was utilized  to evaluate flow at rest and with distal augmentation maneuvers in  the common femoral, femoral and popliteal veins.   COMPARISON:  None.   FINDINGS:  Vein compressibility was not able to be performed at all levels due  to body habitus.   Contralateral Common Femoral Vein: Respiratory phasicity is normal  and symmetric with the symptomatic side. No evidence of thrombus.   Common Femoral Vein: No evidence of thrombus. Normal  compressibility, respiratory phasicity and response to augmentation.   Saphenofemoral Junction: Not well visualized.   Profunda Femoral Vein: Not well visualized.   Femoral Vein: No evidence of thrombus. Normal respiratory phasicity  and response to augmentation.   Popliteal Vein: No evidence of thrombus. Normal respiratory  phasicity and response to augmentation.   Calf Veins: The posterior tibial veins are well visualized, no  evidence of thrombus. Normal flow on color Doppler imaging. Further  evaluation of the calf veins limited due to pain.   Other Findings:  None.   IMPRESSION:  No evidence of deep venous thrombosis, although somewhat limited  exam due to body habitus and discomfort.    Electronically Signed    By: Albin Felling M.D.    On: 11/04/2021 15:40  Procedures .Critical Care Performed by: Domenic Moras, PA-C Authorized by: Domenic Moras, PA-C   Critical  care provider statement:    Critical care time (minutes):  40   Critical care was time spent personally by me on the following activities:  Development of treatment plan with patient or surrogate, discussions with consultants, evaluation of patient's response to treatment, examination of patient, ordering and review of laboratory studies, ordering and review of radiographic studies, ordering and performing treatments and interventions, pulse oximetry, re-evaluation of patient's condition and review of old charts    Medications Ordered in ED Medications  heparin ADULT infusion 100 units/mL (25000 units/282mL) (1,250 Units/hr Intravenous New Bag/Given 11/04/21 2136)  sodium chloride flush (NS) 0.9 % injection  3 mL (has no administration in time range)  acetaminophen (TYLENOL) tablet 650 mg (has no administration in time range)    Or  acetaminophen (TYLENOL) suppository 650 mg (has no administration in time range)  ondansetron (ZOFRAN) tablet 4 mg (has no administration in time range)    Or  ondansetron (ZOFRAN) injection 4 mg (has no administration in time range)  senna-docusate (Senokot-S) tablet 1 tablet (has no administration in time range)  amitriptyline (ELAVIL) tablet 10 mg (has no administration in time range)  albuterol (VENTOLIN HFA) 108 (90 Base) MCG/ACT inhaler 2 puff (has no administration in time range)  benzonatate (TESSALON) capsule 200 mg (has no administration in time range)  gabapentin (NEURONTIN) capsule 300 mg (has no administration in time range)  guaiFENesin (ROBITUSSIN) 100 MG/5ML liquid 200 mg (has no administration in time range)  ipratropium-albuterol (DUONEB) 0.5-2.5 (3) MG/3ML nebulizer solution 3 mL (has no administration in time range)  losartan-hydrochlorothiazide (HYZAAR) 100-25 MG per tablet 1 tablet (has no administration in time range)  metoprolol succinate (TOPROL-XL) 24 hr tablet 25 mg (has no administration in time range)  pravastatin (PRAVACHOL) tablet 40  mg (has no administration in time range)  insulin glargine (LANTUS) injection 20 Units (has no administration in time range)  insulin aspart (novoLOG) injection 0-9 Units (has no administration in time range)  heparin bolus via infusion 4,450 Units (4,450 Units Intravenous Bolus from Bag 11/04/21 2136)  benzonatate (TESSALON) capsule 100 mg (100 mg Oral Given 11/04/21 2133)  acetaminophen (TYLENOL) tablet 1,000 mg (1,000 mg Oral Given 11/04/21 2134)    ED Course/ Medical Decision Making/ A&P                           Medical Decision Making Amount and/or Complexity of Data Reviewed Labs: ordered.  Risk OTC drugs. Prescription drug management. Decision regarding hospitalization.   BP 122/85 (BP Location: Right Arm)    Pulse (!) 123    Temp 99 F (37.2 C) (Oral)    Resp 15    Ht 5' (1.524 m)    Wt 112.5 kg    SpO2 94%    BMI 48.43 kg/m   9:26 PM This is a 51 year old female with history of diabetes, hypertension, previously admitted to the hospital for a prolonged course after she developed complication from influenza A leading to acute respiratory failure at one point she required BiPAP and intubation after she was receiving sedation to have chest CT scan.  She is here for evaluation and management of new PE when she reported having persistent coughing and increased shortness of breath for the past few days.  She was seen at her doctor's office and had a venous Doppler ultrasound of her right lower extremities without evidence of DVT however a chest CTA obtained which shows a nonocclusive thrombus within the right upper lobe segment branch.  This patient presents to the ED for concern of PE, this involves an extensive number of treatment options, and is a complaint that carries with it a high risk of complications and morbidity.  The differential diagnosis includes PE, right heart strain, acs, dissection, pna, ptx  Co morbidities that complicate the patient evaluation DM,  obesity Additional history obtained:  Additional history obtained from husband at bedside External records from outside source obtained and reviewed including notes from PCP's office from today as well as imaging obtained   Lab Tests:  I Ordered, and personally interpreted labs.  The pertinent results include:  normal BNP, doubt CHF. Normal troponin  Imaging Studies ordered:  I reviewed imaging studies including chest CTA and RLE venous doppler study I independently visualized and interpreted imaging which showed nonocclusive thrombus within the right upper lobe segment branch I agree with the radiologist interpretation  Cardiac Monitoring:  The patient was maintained on a cardiac monitor.  I personally viewed and interpreted the cardiac monitored which showed an underlying rhythm of: sinus tach, similar to prior  Medicines ordered and prescription drug management:  I ordered medication including heparin  for acute PE Reevaluation of the patient after these medicines showed that the patient improved I have reviewed the patients home medicines and have made adjustments as needed  Test Considered:   Critical Interventions: assess for right heart strain and started heparin  Consultations Obtained:  I requested consultation with the [facility] Dr. Posey Pronto,  and discussed lab and imaging findings as well as pertinent plan - they recommend: seeing pt in the ER to help determine if she needs admission versus starting on a DOAC and discharge home.  Pt overall doing well, however she is still tachycardic at this time.   Problem List / ED Course: acute PE  Reevaluation:  After the interventions noted above, I reevaluated the patient and found that they have :improved   Dispostion:  After consideration of the diagnostic results and the patients response to treatment, I feel that the patent would benefit from hospital admission for further management.  Hospitalist Dr. Posey Pronto  agrees         Final Clinical Impression(s) / ED Diagnoses Final diagnoses:  Single subsegmental pulmonary embolism without acute cor pulmonale [facility])    Rx / DC Orders ED Discharge Orders     None         Domenic Moras, PA-C 11/05/21 0010    Tegeler, Gwenyth Allegra, MD 11/05/21 0020

## 2021-11-04 NOTE — ED Provider Triage Note (Signed)
Emergency Medicine Provider Triage Evaluation Note  Sara Chan , a 51 y.o. female  was evaluated in triage.  Pt complains of blood clot to the right lungs.  Was sent here by her primary care doctor, feeling short of breath and coughing for the last 2 days..  Review of Systems  Positive: SOB Negative: SYNCOPE  Physical Exam  BP 132/79 (BP Location: Right Arm)    Pulse (!) 128    Temp 99 F (37.2 C) (Oral)    Resp 18    Ht 5' (1.524 m)    Wt 112.5 kg    SpO2 95%    BMI 48.43 kg/m  Gen:   Awake, no distress   Resp:  Normal effort  MSK:   Moves extremities without difficulty  Other:  Tachycardic  Medical Decision Making  Medically screening exam initiated at 6:30 PM.  Appropriate orders placed.  Sara Chan was informed that the remainder of the evaluation will be completed by another provider, this initial triage assessment does not replace that evaluation, and the importance of remaining in the ED until their evaluation is complete.  Will check labs and troponin BNP to evaluate for right heart strain.  Cta: 1. Nonocclusive thrombus in the right upper lobe segmental branch. 2. Dilated main pulmonary artery which can be seen in the setting of pulmonary arterial hypertension. 3. Aortic Atherosclerosis (ICD10-I70.0).   Sherrill Raring, PA-C 11/04/21 1834

## 2021-11-04 NOTE — ED Triage Notes (Signed)
Pt arrived POV from drs office c/o [facility]. Pt was told by dr to come here that she had a blood clot in the right lung. Pt denies any pain at this time.

## 2021-11-05 ENCOUNTER — Other Ambulatory Visit (HOSPITAL_COMMUNITY): Payer: Self-pay

## 2021-11-05 DIAGNOSIS — I2699 Other pulmonary embolism without acute cor pulmonale: Secondary | ICD-10-CM | POA: Diagnosis not present

## 2021-11-05 LAB — CBC
HCT: 35 % — ABNORMAL LOW (ref 36.0–46.0)
Hemoglobin: 11.1 g/dL — ABNORMAL LOW (ref 12.0–15.0)
MCH: 27.2 pg (ref 26.0–34.0)
MCHC: 31.7 g/dL (ref 30.0–36.0)
MCV: 85.8 fL (ref 80.0–100.0)
Platelets: 282 10*3/uL (ref 150–400)
RBC: 4.08 MIL/uL (ref 3.87–5.11)
RDW: 14.4 % (ref 11.5–15.5)
WBC: 7.8 10*3/uL (ref 4.0–10.5)
nRBC: 0 % (ref 0.0–0.2)

## 2021-11-05 LAB — BASIC METABOLIC PANEL
Anion gap: 11 (ref 5–15)
BUN: 11 mg/dL (ref 6–20)
CO2: 26 mmol/L (ref 22–32)
Calcium: 9.1 mg/dL (ref 8.9–10.3)
Chloride: 98 mmol/L (ref 98–111)
Creatinine, Ser: 0.97 mg/dL (ref 0.44–1.00)
GFR, Estimated: >60 mL/min (ref >60)
Glucose, Bld: 123 mg/dL — ABNORMAL HIGH (ref 70–99)
Potassium: 3.4 mmol/L — ABNORMAL LOW (ref 3.5–5.1)
Sodium: 135 mmol/L (ref 135–145)

## 2021-11-05 LAB — CBG MONITORING, ED
Glucose-Capillary: 138 mg/dL — ABNORMAL HIGH (ref 70–99)
Glucose-Capillary: 132 mg/dL — ABNORMAL HIGH (ref 70–99)

## 2021-11-05 LAB — HEPARIN LEVEL (UNFRACTIONATED)
Heparin Unfractionated: 0.45 IU/mL (ref 0.30–0.70)
Heparin Unfractionated: 0.38 IU/mL (ref 0.30–0.70)

## 2021-11-05 MED ORDER — METOPROLOL SUCCINATE ER 25 MG PO TB24
25.0000 mg | ORAL_TABLET | Freq: Every day | ORAL | Status: DC
  Administered 2021-11-05: 25 mg via ORAL
  Filled 2021-11-05: qty 1

## 2021-11-05 MED ORDER — PRAVASTATIN SODIUM 40 MG PO TABS
40.0000 mg | ORAL_TABLET | Freq: Every day | ORAL | Status: DC
  Administered 2021-11-05: 40 mg via ORAL
  Filled 2021-11-05: qty 1

## 2021-11-05 MED ORDER — GABAPENTIN 300 MG PO CAPS
900.0000 mg | ORAL_CAPSULE | Freq: Two times a day (BID) | ORAL | Status: DC
  Administered 2021-11-05 (×2): 900 mg via ORAL
  Filled 2021-11-05 (×2): qty 3

## 2021-11-05 MED ORDER — SENNOSIDES-DOCUSATE SODIUM 8.6-50 MG PO TABS: 1.0000 | ORAL_TABLET | Freq: Every evening | ORAL | Status: DC | PRN

## 2021-11-05 MED ORDER — GABAPENTIN 300 MG PO CAPS: 600.0000 mg | ORAL_CAPSULE | Freq: Every day | ORAL | Status: DC

## 2021-11-05 MED ORDER — POTASSIUM CHLORIDE CRYS ER 20 MEQ PO TBCR
40.0000 meq | EXTENDED_RELEASE_TABLET | Freq: Once | ORAL | Status: AC
  Administered 2021-11-05: 40 meq via ORAL
  Filled 2021-11-05: qty 2

## 2021-11-05 MED ORDER — LOSARTAN POTASSIUM-HCTZ 100-25 MG PO TABS
1.0000 | ORAL_TABLET | Freq: Every day | ORAL | Status: DC
Start: 2021-11-05 — End: 2021-11-05

## 2021-11-05 MED ORDER — ONDANSETRON HCL 4 MG/2ML IJ SOLN: 4.0000 mg | Freq: Four times a day (QID) | INTRAMUSCULAR | Status: DC | PRN

## 2021-11-05 MED ORDER — HYDROCHLOROTHIAZIDE 25 MG PO TABS
25.0000 mg | ORAL_TABLET | Freq: Every day | ORAL | Status: DC
  Administered 2021-11-05: 25 mg via ORAL
  Filled 2021-11-05: qty 1

## 2021-11-05 MED ORDER — ACETAMINOPHEN 325 MG PO TABS: 650.0000 mg | ORAL_TABLET | Freq: Four times a day (QID) | ORAL | Status: DC | PRN

## 2021-11-05 MED ORDER — BENZONATATE 100 MG PO CAPS: 200.0000 mg | ORAL_CAPSULE | Freq: Three times a day (TID) | ORAL | Status: DC | PRN

## 2021-11-05 MED ORDER — GABAPENTIN 300 MG PO CAPS
300.0000 mg | ORAL_CAPSULE | ORAL | Status: DC
Start: 2021-11-05 — End: 2021-11-05

## 2021-11-05 MED ORDER — INSULIN ASPART 100 UNIT/ML IJ SOLN
0.0000 [IU] | Freq: Three times a day (TID) | INTRAMUSCULAR | Status: DC
  Administered 2021-11-05 (×2): 1 [IU] via SUBCUTANEOUS

## 2021-11-05 MED ORDER — SODIUM CHLORIDE 0.9% FLUSH: 3.0000 mL | Freq: Two times a day (BID) | INTRAVENOUS | Status: DC

## 2021-11-05 MED ORDER — APIXABAN 5 MG PO TABS: 5.0000 mg | ORAL_TABLET | Freq: Two times a day (BID) | ORAL | Status: DC

## 2021-11-05 MED ORDER — ALBUTEROL SULFATE (2.5 MG/3ML) 0.083% IN NEBU: 3.0000 mL | INHALATION_SOLUTION | Freq: Four times a day (QID) | RESPIRATORY_TRACT | Status: DC | PRN

## 2021-11-05 MED ORDER — APIXABAN (ELIQUIS) VTE STARTER PACK (10MG AND 5MG): ORAL_TABLET | ORAL | 0 refills | Status: DC

## 2021-11-05 MED ORDER — AMITRIPTYLINE HCL 10 MG PO TABS
10.0000 mg | ORAL_TABLET | Freq: Every day | ORAL | Status: DC
  Administered 2021-11-05: 10 mg via ORAL
  Filled 2021-11-05 (×2): qty 1

## 2021-11-05 MED ORDER — INSULIN GLARGINE 100 UNIT/ML ~~LOC~~ SOLN
20.0000 [IU] | Freq: Every day | SUBCUTANEOUS | Status: DC
  Administered 2021-11-05: 20 [IU] via SUBCUTANEOUS
  Filled 2021-11-05: qty 0.2

## 2021-11-05 MED ORDER — APIXABAN 5 MG PO TABS
10.0000 mg | ORAL_TABLET | Freq: Two times a day (BID) | ORAL | Status: DC
  Administered 2021-11-05: 10 mg via ORAL
  Filled 2021-11-05: qty 2

## 2021-11-05 MED ORDER — GUAIFENESIN 100 MG/5ML PO LIQD
200.0000 mg | Freq: Three times a day (TID) | ORAL | Status: DC | PRN
  Filled 2021-11-05: qty 10

## 2021-11-05 MED ORDER — ACETAMINOPHEN 650 MG RE SUPP: 650.0000 mg | Freq: Four times a day (QID) | RECTAL | Status: DC | PRN

## 2021-11-05 MED ORDER — IPRATROPIUM-ALBUTEROL 0.5-2.5 (3) MG/3ML IN SOLN: 3.0000 mL | Freq: Four times a day (QID) | RESPIRATORY_TRACT | Status: DC | PRN

## 2021-11-05 MED ORDER — LOSARTAN POTASSIUM 50 MG PO TABS
100.0000 mg | ORAL_TABLET | Freq: Every day | ORAL | Status: DC
  Administered 2021-11-05: 100 mg via ORAL
  Filled 2021-11-05: qty 2

## 2021-11-05 MED ORDER — ONDANSETRON HCL 4 MG PO TABS: 4.0000 mg | ORAL_TABLET | Freq: Four times a day (QID) | ORAL | Status: DC | PRN

## 2021-11-05 NOTE — Progress Notes (Signed)
ANTICOAGULATION CONSULT NOTE - Initial Consult  Pharmacy Consult for heparin Indication: pulmonary embolus  Allergies  Allergen Reactions   Codeine Nausea And Vomiting   Flagyl [Metronidazole] Hives    Patient Measurements: Height: 5' (152.4 cm) Weight: 112.5 kg (248 lb) IBW/kg (Calculated) : 45.5 Heparin Dosing Weight: 73.6  Vital Signs: Temp: 98.2 F (36.8 C) (01/20 0758) Temp Source: Oral (01/20 0758) BP: 147/84 (01/20 0830) Pulse Rate: 116 (01/20 0830)  Labs: Recent Labs    11/04/21 1836 11/04/21 2138 11/05/21 0310 11/05/21 0846  HGB 11.8*  --  11.1*  --   HCT 37.2  --  35.0*  --   PLT 299  --  282  --   HEPARINUNFRC  --   --  0.45 0.38  CREATININE 0.99  --  0.97  --   TROPONINIHS 11 13  --   --      Estimated Creatinine Clearance: 76.6 mL/min (by C-G formula based on SCr of 0.97 mg/dL).   Assessment: 61 YOF presenting to the ED with shortness of breath after being advised to come to hospital from outside clinic for PE. CT angio positive for nonocclusive thrombus in the right upper lobe segmental branch.  No history of anticoagulation prior to admission. Pharmacy to dose IV heparin.   Heparin level remains therapeutic at 0.38. No bleeding noted. P  Goal of Therapy:  Heparin level 0.3-0.7 units/ml Monitor platelets by anticoagulation protocol: Yes   Plan:  Continue heparin gtt 1250 units/hr Daily heparin level and CBC  Salome Arnt, PharmD, BCPS Clinical Pharmacist Please see AMION for all pharmacy numbers 11/05/2021 9:55 AM

## 2021-11-05 NOTE — Assessment & Plan Note (Signed)
Continue CPAP nightly. °

## 2021-11-05 NOTE — Assessment & Plan Note (Signed)
Currently stable.  Continue DuoNeb and albuterol as needed.

## 2021-11-05 NOTE — Discharge Summary (Signed)
Physician Discharge Summary  Sara Chan WNU:[id number] DOB: [birth year] DOA: 11/04/2021  PCP: Dineen Kid, MD  Admit date: 11/04/2021 Discharge date: 11/05/2021  Admitted From: Home Disposition: Home     Discharge Diagnoses:  Principal Problem:   Acute pulmonary embolism without acute cor pulmonale (HCC) Active Problems:   Type 2 diabetes mellitus ([place])   Hypertension associated with diabetes ([place])   Hyperlipidemia associated with type 2 diabetes mellitus ([place])   OSA on CPAP   Asthma in adult, severe persistent, uncomplicated     Brief Summary: Sara Chan is a 51 y.o. female with medical history significant for asthma, T2DM, HTN, HLD, obesity, OSA on CPAP who presented to the ED for evaluation of PE.  Patient recently admitted 10/06/2021-10/19/2021 for hypoxic respiratory failure due to severe asthma exacerbation with mucous plugging and pulmonary edema.  She was initially intubated and admitted to the ICU.  She was extubated 12/29 and ultimately discharged on room air.  Patient states she was doing well until few days ago when she developed frequent nonproductive cough with chest congestion.  She has had some shortness of breath which she has been managing with her home nebulizers.  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  She called her PCP who arranged for her to undergo right lower extremity venous ultrasound and CTA chest.  RLE ultrasound was negative for evidence of DVT but limited study.  CTA chest was positive for nonocclusive thrombus in the right upper lobe segmental branch.  Dilated main pulmonary artery noted.  Images and report available in PACS. Patient was advised to come to the ED for further evaluation.  Hospital Course:  Acute PE - Initiated on heparin - No complaints of chest pain, pulse ox is greater than 90% on exertion, -she has some  tachycardia but also states that she is in a great deal of pain due to exacerbation of her chronic pain from laying on the stretcher - Blood pressures are stable, she is not hypoxic on exertion or short of breath -Troponin have been negative - Transition to Eliquis after verification of insurance coverage-patient is in agreement with this plan  Essential hypertension - Can continue home medications   Discharge Exam: Vitals:   11/05/21 1030 11/05/21 1100  BP: 135/70 (!) 141/91  Pulse: (!) 101 100  Resp: 17 15  Temp:    SpO2: 97% 97%   Vitals:   11/05/21 0930 11/05/21 1000 11/05/21 1030 11/05/21 1100  BP: 135/82 123/66 135/70 (!) 141/91  Pulse: (!) 107 (!) 107 (!) 101 100  Resp: 17 (!) 23 17 15   Temp:      TempSrc:      SpO2: 99% 96% 97% 97%  Weight:      Height:        General: Pt is alert, awake, not in acute distress Cardiovascular: RRR, S1/S2 +, no rubs, no gallops Respiratory: CTA bilaterally, no wheezing, no rhonchi Abdominal: Soft, NT, ND, bowel sounds + Extremities: no edema, no cyanosis   Discharge Instructions   Allergies as of 11/05/2021       Reactions   Codeine Nausea And Vomiting   Flagyl [metronidazole] Hives        Medication List     TAKE these medications    acetaminophen 650 MG CR tablet Commonly known as: TYLENOL Take 650 mg by mouth every 8 (eight) hours as needed for pain.  albuterol 108 (90 Base) MCG/ACT inhaler Commonly known as: VENTOLIN HFA Inhale 2 puffs into the lungs every 6 (six) hours as needed for wheezing or shortness of breath.   amitriptyline 10 MG tablet Commonly known as: ELAVIL Take 10 mg by mouth at bedtime.   Apixaban Starter Pack (10mg  and 5mg ) Commonly known as: ELIQUIS STARTER PACK Take as directed on package: start with two-5mg  tablets twice daily for 7 days. On day 8, switch to one-5mg  tablet twice daily.   benzonatate 200 MG capsule Commonly known as: TESSALON Take 200 mg by mouth 3 (three) times daily  as needed for cough.   BIOTIN PO Take 1 capsule by mouth daily.   cyanocobalamin 1000 MCG tablet Take 1 tablet by mouth daily.   gabapentin 300 MG capsule Commonly known as: NEURONTIN Take 300 mg by mouth See admin instructions. 900 mg in the morning. 600 mg in the afternoon. 900 mg at bedtime   guaiFENesin 100 MG/5ML liquid Commonly known as: ROBITUSSIN Take 200 mg by mouth 3 (three) times daily as needed for cough.   ipratropium-albuterol 0.5-2.5 (3) MG/3ML Soln Commonly known as: DUONEB Inhale 3 mLs into the lungs as needed.   losartan-hydrochlorothiazide 100-25 MG tablet Commonly known as: HYZAAR Take 1 tablet by mouth daily.   metFORMIN 1000 MG tablet Commonly known as: GLUCOPHAGE Take 500 mg by mouth 2 (two) times daily.   methocarbamol 500 MG tablet Commonly known as: ROBAXIN Take 500 mg by mouth 3 (three) times daily as needed for muscle spasms.   metoprolol succinate 25 MG 24 hr tablet Commonly known as: TOPROL-XL Take 25 mg by mouth daily.   pravastatin 40 MG tablet Commonly known as: PRAVACHOL Take 40 mg by mouth daily.   SEMGLEE [place] Inject 20 Units into the skin daily.        Allergies  Allergen Reactions   Codeine Nausea And Vomiting   Flagyl [Metronidazole] Hives      DG CHEST PORT 1 VIEW  Result Date: 10/12/2021 CLINICAL DATA:  Respiratory failure EXAM: PORTABLE CHEST 1 VIEW COMPARISON:  Two days prior FINDINGS: Endotracheal tube appears to terminate 2 cm from the level of the carina. There is a right-sided approach central venous catheter with tip terminating within the right atrium. There is an enteric tube coursing past the diaphragm and distal tip not visualized. The heart appears at the upper limit of normal. No pleural effusion. No pneumothorax. Right upper lobe collapse is now improved. Left lung opacities appear improved. Linear subsegmental atelectasis in the right mid lung. No acute osseous abnormality. IMPRESSION: 1. Endotracheal tube  terminates 2 cm from the level of the carina. Other support lines and tubes as described. 2. Improved aeration of both lungs with interval resolution of previously seen right upper lobe collapse. There is small volume subsegmental atelectasis in the right mid lung. Electronically Signed   By: Albin Felling M.D.   On: 10/12/2021 09:31   DG Chest Port 1 View  Result Date: 10/10/2021 CLINICAL DATA:  51 year old female with hypoxia. Respiratory failure. Intubated. EXAM: PORTABLE CHEST 1 VIEW COMPARISON:  Portable chest 10/08/2021 and earlier. FINDINGS: Portable AP view at 0446 hours. Endotracheal tube tip remains in good position between the clavicles and carina. Enteric tube courses to the abdomen. Stable right IJ central line, although catheter tip projects at the right atrium level. Confluent new right upper lobe collapse with upward bowing of the minor fissure. Hyperinflated right lower lung. No superimposed pneumothorax. Left hilum also elevated, but no  confluent left lung opacity by portable exam. No pneumothorax. No acute osseous abnormality identified. Paucity of bowel gas in the upper abdomen. IMPRESSION: 1. Right upper lobe collapse is new since 10/08/2021. ETT remains in good position. 2. Note that right IJ central line tip at the right atrium level. Recommend retraction of 1-2 cm for more optimal placement. 3. Stable enteric tube. These results will be called to the ordering clinician or representative by the Radiologist Assistant, and communication documented in the PACS or [employer]. Electronically Signed   By: Genevie Ann M.D.   On: 10/10/2021 05:02   DG CHEST PORT 1 VIEW  Result Date: 10/08/2021 CLINICAL DATA:  Central line placement EXAM: PORTABLE CHEST 1 VIEW COMPARISON:  10/07/2021 FINDINGS: Endotracheal tube and NG tube unchanged. Introduction central venous line with tip at the cavoatrial junction. No pneumothorax. No pulmonary edema. IMPRESSION: Introduction of RIGHT central venous  line without complication. No interval change otherwise Electronically Signed   By: Suzy Bouchard M.D.   On: 10/08/2021 15:13   DG CHEST PORT 1 VIEW  Result Date: 10/07/2021 CLINICAL DATA:  Check endotracheal tube placement EXAM: PORTABLE CHEST 1 VIEW COMPARISON:  10/06/2021 FINDINGS: Endotracheal tube is noted in satisfactory position. Gastric catheter extends into the stomach. Cardiac shadow is stable but accentuated by the portable technique. The overall inspiratory effort is poor with crowding of the vascular markings. No focal infiltrate is noted. IMPRESSION: Tubes and lines in satisfactory position. Poor inspiratory effort with crowding of the vascular markings. Electronically Signed   By: Inez Catalina M.D.   On: 10/07/2021 23:53   ECHOCARDIOGRAM COMPLETE  Result Date: 10/06/2021    ECHOCARDIOGRAM REPORT   Patient Name:   Sara Chan Date of Exam: 10/06/2021 Medical Rec #:  [id number]    Height:       62.0 in Accession #:    [id number]   Weight:       277.0 lb Date of Birth:  02-Oct-1971    BSA:          2.195 m Patient Age:    87 years     BP:           124/58 mmHg Patient Gender: F            HR:           108 bpm. Exam Location:  Inpatient Procedure: 2D Echo Indications:    dyspnea  History:        Patient has no prior history of Echocardiogram examinations.  Sonographer:    Johny Chess RDCS Referring Phys: [place] Comments: Echo performed with patient supine and on artificial respirator and patient is morbidly obese. Image acquisition challenging due to patient body habitus. IMPRESSIONS  1. Left ventricular ejection fraction, by estimation, is 60 to 65%. The left ventricle has normal function. The left ventricle has no regional wall motion abnormalities. Indeterminate diastolic filling due to E-A fusion.  2. Right ventricular systolic function is normal. The right ventricular size is normal. There is normal pulmonary artery systolic pressure.  3. The mitral valve  is normal in structure. No evidence of mitral valve regurgitation. No evidence of mitral stenosis.  4. The aortic valve is normal in structure. Aortic valve regurgitation is mild. No aortic stenosis is present.  5. The inferior vena cava is normal in size with greater than 50% respiratory variability, suggesting right atrial pressure of 3 mmHg. FINDINGS  Left Ventricle: Left ventricular ejection fraction, by estimation,  is 60 to 65%. The left ventricle has normal function. The left ventricle has no regional wall motion abnormalities. The left ventricular internal cavity size was normal in size. There is  no left ventricular hypertrophy. Indeterminate diastolic filling due to E-A fusion. Right Ventricle: The right ventricular size is normal. No increase in right ventricular wall thickness. Right ventricular systolic function is normal. There is normal pulmonary artery systolic pressure. The tricuspid regurgitant velocity is 2.50 m/s, and  with an assumed right atrial pressure of 10 mmHg, the estimated right ventricular systolic pressure is 17.0 mmHg. Left Atrium: Left atrial size was normal in size. Right Atrium: Right atrial size was normal in size. Pericardium: There is no evidence of pericardial effusion. Mitral Valve: The mitral valve is normal in structure. No evidence of mitral valve regurgitation. No evidence of mitral valve stenosis. Tricuspid Valve: The tricuspid valve is normal in structure. Tricuspid valve regurgitation is mild . No evidence of tricuspid stenosis. Aortic Valve: The aortic valve is normal in structure. Aortic valve regurgitation is mild. No aortic stenosis is present. Pulmonic Valve: The pulmonic valve was normal in structure. Pulmonic valve regurgitation is not visualized. No evidence of pulmonic stenosis. Aorta: The aortic root is normal in size and structure. Venous: IVC assessment for right atrial pressure unable to be performed due to mechanical ventilation. The inferior vena cava is  normal in size with greater than 50% respiratory variability, suggesting right atrial pressure of 3 mmHg. IAS/Shunts: No atrial level shunt detected by color flow Doppler.  LEFT VENTRICLE PLAX 2D LVOT diam:     1.90 cm   Diastology LV SV:         52        LV e' lateral: 8.81 cm/s LV SV Index:   24 LVOT Area:     2.84 cm  RIGHT VENTRICLE             IVC RV S prime:     15.20 cm/s  IVC diam: 2.30 cm TAPSE (M-mode): 2.0 cm LEFT ATRIUM             Index        RIGHT ATRIUM           Index LA Vol (A2C):   55.6 ml 25.32 ml/m  RA Area:     13.10 cm LA Vol (A4C):   42.1 ml 19.18 ml/m  RA Volume:   29.60 ml  13.48 ml/m LA Biplane Vol: 50.3 ml 22.91 ml/m  AORTIC VALVE LVOT Vmax:   118.00 cm/s LVOT Vmean:  74.500 cm/s LVOT VTI:    0.185 m  AORTA Ao Root diam: 2.70 cm Ao Asc diam:  3.40 cm TRICUSPID VALVE TR Peak grad:   25.0 mmHg TR Vmax:        250.00 cm/s  SHUNTS Systemic VTI:  0.18 m Systemic Diam: 1.90 cm Dani Gobble Croitoru MD Electronically signed by Sanda Klein MD Signature Date/Time: 10/06/2021/3:05:16 PM    Final    [place] EKG SITE RITE  Result Date: 10/12/2021 If Site Rite image not attached, placement could not be confirmed due to current cardiac rhythm.    The results of significant diagnostics from this hospitalization (including imaging, microbiology, ancillary and laboratory) are listed below for reference.     Microbiology: Recent Results (from the past 240 hour(s))  Resp Panel by RT-PCR (Flu A&B, Covid) Nasopharyngeal Swab     Status: None   Collection Time: 11/04/21  9:54 PM   Specimen: Nasopharyngeal Swab; Nasopharyngeal(NP)  swabs in vial transport medium  Result Value Ref Range Status   SARS Coronavirus 2 by RT PCR NEGATIVE NEGATIVE Final    Comment: (NOTE) SARS-CoV-2 target nucleic acids are NOT DETECTED.  The SARS-CoV-2 RNA is generally detectable in upper respiratory specimens during the acute phase of infection. The lowest concentration of SARS-CoV-2 viral copies this assay can  detect is 138 copies/mL. A negative result does not preclude SARS-Cov-2 infection and should not be used as the sole basis for treatment or other patient management decisions. A negative result may occur with  improper specimen collection/handling, submission of specimen other than nasopharyngeal swab, presence of viral mutation(s) within the areas targeted by this assay, and inadequate number of viral copies(<138 copies/mL). A negative result must be combined with clinical observations, patient history, and epidemiological information. The expected result is Negative.  Fact Sheet for Patients:  EntrepreneurPulse.com.au  Fact Sheet for Healthcare Providers:  IncredibleEmployment.be  This test is no t yet approved or cleared by the [place] FDA and  has been authorized for detection and/or diagnosis of SARS-CoV-2 by FDA under an Emergency Use Authorization (EUA). This EUA will remain  in effect (meaning this test can be used) for the duration of the COVID-19 declaration under Section 564(b)(1) of the Act, 21 U.S.C.section 360bbb-3(b)(1), unless the authorization is terminated  or revoked sooner.       Influenza A by PCR NEGATIVE NEGATIVE Final   Influenza B by PCR NEGATIVE NEGATIVE Final    Comment: (NOTE) The Xpert Xpress SARS-CoV-2/FLU/RSV plus assay is intended as an aid in the diagnosis of influenza from Nasopharyngeal swab specimens and should not be used as a sole basis for treatment. Nasal washings and aspirates are unacceptable for Xpert Xpress SARS-CoV-2/FLU/RSV testing.  Fact Sheet for Patients: EntrepreneurPulse.com.au  Fact Sheet for Healthcare Providers: IncredibleEmployment.be  This test is not yet approved or cleared by the [place] FDA and has been authorized for detection and/or diagnosis of SARS-CoV-2 by FDA under an Emergency Use Authorization (EUA). This EUA will remain in  effect (meaning this test can be used) for the duration of the COVID-19 declaration under Section 564(b)(1) of the Act, 21 U.S.C. section 360bbb-3(b)(1), unless the authorization is terminated or revoked.  Performed at [facility], [place] [address] [place], [place] [address]      Labs: BNP (last 3 results) Recent Labs    10/06/21 0620 10/08/21 0120 11/04/21 1836  BNP 27.2 21.2 66.0   Basic Metabolic Panel: Recent Labs  Lab 11/04/21 1836 11/05/21 0310  NA 136 135  K 3.7 3.4*  CL 96* 98  CO2 27 26  GLUCOSE 170* 123*  BUN 9 11  CREATININE 0.99 0.97  CALCIUM 9.4 9.1   Liver Function Tests: No results for input(s): AST, ALT, ALKPHOS, BILITOT, PROT, ALBUMIN in the last 168 hours. No results for input(s): LIPASE, AMYLASE in the last 168 hours. No results for input(s): AMMONIA in the last 168 hours. CBC: Recent Labs  Lab 11/04/21 1836 11/05/21 0310  WBC 6.7 7.8  NEUTROABS 2.9  --   HGB 11.8* 11.1*  HCT 37.2 35.0*  MCV 85.7 85.8  PLT 299 282   Cardiac Enzymes: No results for input(s): CKTOTAL, CKMB, CKMBINDEX, TROPONINI in the last 168 hours. BNP: Invalid input(s): POCBNP CBG: Recent Labs  Lab 11/05/21 0755 11/05/21 1138  GLUCAP 138* 132*   D-Dimer No results for input(s): DDIMER in the last 72 hours. Hgb A1c No results for input(s): HGBA1C in the last  72 hours. Lipid Profile No results for input(s): CHOL, HDL, LDLCALC, TRIG, CHOLHDL, LDLDIRECT in the last 72 hours. Thyroid function studies No results for input(s): TSH, T4TOTAL, T3FREE, THYROIDAB in the last 72 hours.  Invalid input(s): FREET3 Anemia work up No results for input(s): VITAMINB12, FOLATE, FERRITIN, TIBC, IRON, RETICCTPCT in the last 72 hours. Urinalysis    Component Value Date/Time   COLORURINE STRAW (A) 10/06/2021 0949   APPEARANCEUR CLEAR 10/06/2021 0949   LABSPEC 1.029 10/06/2021 0949   PHURINE 5.0 10/06/2021 0949   GLUCOSEU >=500 (A) 10/06/2021 0949   HGBUR NEGATIVE  10/06/2021 0949   BILIRUBINUR NEGATIVE 10/06/2021 0949   KETONESUR 20 (A) 10/06/2021 0949   PROTEINUR 100 (A) 10/06/2021 0949   UROBILINOGEN 1.0 12/13/2014 2340   NITRITE NEGATIVE 10/06/2021 0949   LEUKOCYTESUR NEGATIVE 10/06/2021 0949   Sepsis Labs Invalid input(s): PROCALCITONIN,  WBC,  LACTICIDVEN Microbiology Recent Results (from the past 240 hour(s))  Resp Panel by RT-PCR (Flu A&B, Covid) Nasopharyngeal Swab     Status: None   Collection Time: 11/04/21  9:54 PM   Specimen: Nasopharyngeal Swab; Nasopharyngeal(NP) swabs in vial transport medium  Result Value Ref Range Status   SARS Coronavirus 2 by RT PCR NEGATIVE NEGATIVE Final    Comment: (NOTE) SARS-CoV-2 target nucleic acids are NOT DETECTED.  The SARS-CoV-2 RNA is generally detectable in upper respiratory specimens during the acute phase of infection. The lowest concentration of SARS-CoV-2 viral copies this assay can detect is 138 copies/mL. A negative result does not preclude SARS-Cov-2 infection and should not be used as the sole basis for treatment or other patient management decisions. A negative result may occur with  improper specimen collection/handling, submission of specimen other than nasopharyngeal swab, presence of viral mutation(s) within the areas targeted by this assay, and inadequate number of viral copies(<138 copies/mL). A negative result must be combined with clinical observations, patient history, and epidemiological information. The expected result is Negative.  Fact Sheet for Patients:  EntrepreneurPulse.com.au  Fact Sheet for Healthcare Providers:  IncredibleEmployment.be  This test is no t yet approved or cleared by the [place] FDA and  has been authorized for detection and/or diagnosis of SARS-CoV-2 by FDA under an Emergency Use Authorization (EUA). This EUA will remain  in effect (meaning this test can be used) for the duration of the COVID-19  declaration under Section 564(b)(1) of the Act, 21 U.S.C.section 360bbb-3(b)(1), unless the authorization is terminated  or revoked sooner.       Influenza A by PCR NEGATIVE NEGATIVE Final   Influenza B by PCR NEGATIVE NEGATIVE Final    Comment: (NOTE) The Xpert Xpress SARS-CoV-2/FLU/RSV plus assay is intended as an aid in the diagnosis of influenza from Nasopharyngeal swab specimens and should not be used as a sole basis for treatment. Nasal washings and aspirates are unacceptable for Xpert Xpress SARS-CoV-2/FLU/RSV testing.  Fact Sheet for Patients: EntrepreneurPulse.com.au  Fact Sheet for Healthcare Providers: IncredibleEmployment.be  This test is not yet approved or cleared by the [place] FDA and has been authorized for detection and/or diagnosis of SARS-CoV-2 by FDA under an Emergency Use Authorization (EUA). This EUA will remain in effect (meaning this test can be used) for the duration of the COVID-19 declaration under Section 564(b)(1) of the Act, 21 U.S.C. section 360bbb-3(b)(1), unless the authorization is terminated or revoked.  Performed at [facility], [place] [address], [place], [place] [address]        SIGNED:   Debbe Odea, MD  [facility] 11/05/2021, 1:10 PM

## 2021-11-05 NOTE — TOC Benefit Eligibility Note (Signed)
Patient Teacher, English as a foreign language completed.    The patient is currently admitted and upon discharge could be taking Eliquis 5 mg.  The current 30 day co-pay is, $20.00.   The patient is insured through [employer] [place] Patient Advocate Specialist [place] Patient Advocate Team Direct Number: [phone]  Fax: [phone]

## 2021-11-05 NOTE — Progress Notes (Signed)
ANTICOAGULATION CONSULT NOTE  Pharmacy Consult for heparin Indication: pulmonary embolus  Allergies  Allergen Reactions   Codeine Nausea And Vomiting   Flagyl [Metronidazole] Hives    Patient Measurements: Height: 5' (152.4 cm) Weight: 112.5 kg (248 lb) IBW/kg (Calculated) : 45.5 Heparin Dosing Weight: 73.6  Vital Signs: Temp: 98.2 F (36.8 C) (01/20 0758) Temp Source: Oral (01/20 0758) BP: 123/66 (01/20 1000) Pulse Rate: 107 (01/20 1000)  Labs: Recent Labs    11/04/21 1836 11/04/21 2138 11/05/21 0310 11/05/21 0846  HGB 11.8*  --  11.1*  --   HCT 37.2  --  35.0*  --   PLT 299  --  282  --   HEPARINUNFRC  --   --  0.45 0.38  CREATININE 0.99  --  0.97  --   TROPONINIHS 11 13  --   --      Estimated Creatinine Clearance: 76.6 mL/min (by C-G formula based on SCr of 0.97 mg/dL).   Assessment: 51 YOF presenting to the ED with shortness of breath after being advised to come to hospital from outside clinic for PE. CT angio positive for nonocclusive thrombus in the right upper lobe segmental branch.  No history of anticoagulation prior to admission. Pharmacy to dose IV heparin, now transitioning to apixaban.  Goal of Therapy:  Heparin level 0.3-0.7 units/ml Monitor platelets by anticoagulation protocol: Yes   Plan:  D/c heparin infusion at time of 1st dose of apixaban - communicated plan with RN Apixaban 10mg  PO BID x 7 days, then 5mg  PO BID Monitor CBC, s/sx bleeding   Arturo Morton, PharmD, BCPS Please check AMION for all [place] contact numbers Clinical Pharmacist 11/05/2021 11:18 AM

## 2021-11-05 NOTE — Discharge Instructions (Addendum)
Information on my medicine - ELIQUIS (apixaban)  This medication education was reviewed with me or my healthcare representative as part of my discharge preparation.  Why was Eliquis prescribed for you? Eliquis was prescribed to treat blood clots that may have been found in the veins of your legs (deep vein thrombosis) or in your lungs (pulmonary embolism) and to reduce the risk of them occurring again.  What do You need to know about Eliquis ? The starting dose is 10 mg (two 5 mg tablets) taken TWICE daily for the FIRST SEVEN (7) DAYS, then on (enter date)  11/12/2021  the dose is reduced to ONE 5 mg tablet taken TWICE daily.  Eliquis may be taken with or without food.   Try to take the dose about the same time in the morning and in the evening. If you have difficulty swallowing the tablet whole please discuss with your pharmacist how to take the medication safely.  Take Eliquis exactly as prescribed and DO NOT stop taking Eliquis without talking to the doctor who prescribed the medication.  Stopping may increase your risk of developing a new blood clot.  Refill your prescription before you run out.  After discharge, you should have regular check-up appointments with your healthcare provider that is prescribing your Eliquis.    What do you do if you miss a dose? If a dose of ELIQUIS is not taken at the scheduled time, take it as soon as possible on the same day and twice-daily administration should be resumed. The dose should not be doubled to make up for a missed dose.  Important Safety Information A possible side effect of Eliquis is bleeding. You should call your healthcare provider right away if you experience any of the following: Bleeding from an injury or your nose that does not stop. Unusual colored urine (red or dark brown) or unusual colored stools (red or black). Unusual bruising for unknown reasons. A serious fall or if you hit your head (even if there is no  bleeding).  Some medicines may interact with Eliquis and might increase your risk of bleeding or clotting while on Eliquis. To help avoid this, consult your healthcare provider or pharmacist prior to using any new prescription or non-prescription medications, including herbals, vitamins, non-steroidal anti-inflammatory drugs (NSAIDs) and supplements.  This website has more information on Eliquis (apixaban): http://www.eliquis.com/eliquis/home

## 2021-11-05 NOTE — Assessment & Plan Note (Signed)
Continue pravastatin 

## 2021-11-05 NOTE — Assessment & Plan Note (Signed)
Continue Toprol-XL, losartan, HCTZ.

## 2021-11-05 NOTE — Assessment & Plan Note (Signed)
Hold metformin, continue insulin Semglee 20 units daily and SSI.

## 2021-11-05 NOTE — Assessment & Plan Note (Addendum)
Nonocclusive PE right upper lobe segmental branch seen on outpatient CTA chest.  Patient remains tachycardic but otherwise hemodynamically stable, no evidence of right heart strain.  Suspect provoked in setting of recent hospitalization with ICU stay. -Continue IV heparin -Can potentially transition to [facility] and discharge tomorrow

## 2021-11-05 NOTE — Progress Notes (Signed)
ANTICOAGULATION CONSULT NOTE - Follow Up Consult  Pharmacy Consult for heparin Indication: pulmonary embolus  Labs: Recent Labs    11/04/21 1836 11/04/21 2138 11/05/21 0310  HGB 11.8*  --  11.1*  HCT 37.2  --  35.0*  PLT 299  --  282  HEPARINUNFRC  --   --  0.45  CREATININE 0.99  --   --   TROPONINIHS 11 13  --     Assessment/Plan:  51yo female therapeutic on heparin with initial dosing for PE. Will continue infusion at current rate of 1250 units/hr and confirm stable with additional level.   Wynona Neat, PharmD, BCPS  11/05/2021,3:58 AM

## 2022-11-29 DIAGNOSIS — E1169 Type 2 diabetes mellitus with other specified complication: Secondary | ICD-10-CM | POA: Diagnosis not present

## 2022-11-29 DIAGNOSIS — R0989 Other specified symptoms and signs involving the circulatory and respiratory systems: Secondary | ICD-10-CM | POA: Diagnosis not present

## 2022-11-29 DIAGNOSIS — J4550 Severe persistent asthma, uncomplicated: Secondary | ICD-10-CM | POA: Diagnosis not present

## 2022-11-29 DIAGNOSIS — Z794 Long term (current) use of insulin: Secondary | ICD-10-CM | POA: Diagnosis not present

## 2022-11-29 DIAGNOSIS — I152 Hypertension secondary to endocrine disorders: Secondary | ICD-10-CM | POA: Diagnosis not present

## 2022-11-29 DIAGNOSIS — E1159 Type 2 diabetes mellitus with other circulatory complications: Secondary | ICD-10-CM | POA: Diagnosis not present

## 2022-11-29 DIAGNOSIS — E785 Hyperlipidemia, unspecified: Secondary | ICD-10-CM | POA: Diagnosis not present

## 2022-11-29 DIAGNOSIS — E119 Type 2 diabetes mellitus without complications: Secondary | ICD-10-CM | POA: Diagnosis not present

## 2022-11-29 DIAGNOSIS — Z6841 Body Mass Index (BMI) 40.0 and over, adult: Secondary | ICD-10-CM | POA: Diagnosis not present

## 2022-12-23 DIAGNOSIS — E1165 Type 2 diabetes mellitus with hyperglycemia: Secondary | ICD-10-CM | POA: Diagnosis not present

## 2022-12-23 DIAGNOSIS — Z794 Long term (current) use of insulin: Secondary | ICD-10-CM | POA: Diagnosis not present

## 2022-12-23 DIAGNOSIS — Z6841 Body Mass Index (BMI) 40.0 and over, adult: Secondary | ICD-10-CM | POA: Diagnosis not present

## 2022-12-23 DIAGNOSIS — M79605 Pain in left leg: Secondary | ICD-10-CM | POA: Diagnosis not present

## 2022-12-23 DIAGNOSIS — I2693 Single subsegmental pulmonary embolism without acute cor pulmonale: Secondary | ICD-10-CM | POA: Diagnosis not present

## 2023-01-23 DIAGNOSIS — M25562 Pain in left knee: Secondary | ICD-10-CM | POA: Diagnosis not present

## 2023-02-06 DIAGNOSIS — Z86711 Personal history of pulmonary embolism: Secondary | ICD-10-CM | POA: Diagnosis not present

## 2023-02-06 DIAGNOSIS — Z91148 Patient's other noncompliance with medication regimen for other reason: Secondary | ICD-10-CM | POA: Diagnosis not present

## 2023-02-06 DIAGNOSIS — R600 Localized edema: Secondary | ICD-10-CM | POA: Diagnosis not present

## 2023-04-28 DIAGNOSIS — M25562 Pain in left knee: Secondary | ICD-10-CM | POA: Diagnosis not present

## 2023-04-28 DIAGNOSIS — Z794 Long term (current) use of insulin: Secondary | ICD-10-CM | POA: Diagnosis not present

## 2023-04-28 DIAGNOSIS — Z6841 Body Mass Index (BMI) 40.0 and over, adult: Secondary | ICD-10-CM | POA: Diagnosis not present

## 2023-04-28 DIAGNOSIS — Z23 Encounter for immunization: Secondary | ICD-10-CM | POA: Diagnosis not present

## 2023-04-28 DIAGNOSIS — G4733 Obstructive sleep apnea (adult) (pediatric): Secondary | ICD-10-CM | POA: Diagnosis not present

## 2023-04-28 DIAGNOSIS — J4550 Severe persistent asthma, uncomplicated: Secondary | ICD-10-CM | POA: Diagnosis not present

## 2023-04-28 DIAGNOSIS — I10 Essential (primary) hypertension: Secondary | ICD-10-CM | POA: Diagnosis not present

## 2023-04-28 DIAGNOSIS — E1165 Type 2 diabetes mellitus with hyperglycemia: Secondary | ICD-10-CM | POA: Diagnosis not present

## 2023-04-28 DIAGNOSIS — Z0000 Encounter for general adult medical examination without abnormal findings: Secondary | ICD-10-CM | POA: Diagnosis not present

## 2023-07-31 DIAGNOSIS — Z23 Encounter for immunization: Secondary | ICD-10-CM | POA: Diagnosis not present

## 2023-07-31 DIAGNOSIS — R5383 Other fatigue: Secondary | ICD-10-CM | POA: Diagnosis not present

## 2023-07-31 DIAGNOSIS — Z7984 Long term (current) use of oral hypoglycemic drugs: Secondary | ICD-10-CM | POA: Diagnosis not present

## 2023-07-31 DIAGNOSIS — R768 Other specified abnormal immunological findings in serum: Secondary | ICD-10-CM | POA: Diagnosis not present

## 2023-07-31 DIAGNOSIS — Z6841 Body Mass Index (BMI) 40.0 and over, adult: Secondary | ICD-10-CM | POA: Diagnosis not present

## 2023-07-31 DIAGNOSIS — Z7985 Long-term (current) use of injectable non-insulin antidiabetic drugs: Secondary | ICD-10-CM | POA: Diagnosis not present

## 2023-07-31 DIAGNOSIS — I10 Essential (primary) hypertension: Secondary | ICD-10-CM | POA: Diagnosis not present

## 2023-07-31 DIAGNOSIS — Z794 Long term (current) use of insulin: Secondary | ICD-10-CM | POA: Diagnosis not present

## 2023-07-31 DIAGNOSIS — E1165 Type 2 diabetes mellitus with hyperglycemia: Secondary | ICD-10-CM | POA: Diagnosis not present

## 2023-08-10 ENCOUNTER — Other Ambulatory Visit: Payer: Self-pay

## 2023-08-10 ENCOUNTER — Emergency Department (HOSPITAL_COMMUNITY): Payer: 59

## 2023-08-10 ENCOUNTER — Emergency Department (HOSPITAL_COMMUNITY)
Admission: EM | Admit: 2023-08-10 | Discharge: 2023-08-11 | Disposition: A | Discharge status: Home / Self Care | Payer: 59 | Attending: Emergency Medicine | Admitting: Emergency Medicine

## 2023-08-10 ENCOUNTER — Encounter (HOSPITAL_COMMUNITY): Payer: Self-pay | Admitting: Emergency Medicine

## 2023-08-10 DIAGNOSIS — R0602 Shortness of breath: Secondary | ICD-10-CM | POA: Diagnosis not present

## 2023-08-10 DIAGNOSIS — J810 Acute pulmonary edema: Secondary | ICD-10-CM | POA: Insufficient documentation

## 2023-08-10 DIAGNOSIS — Z794 Long term (current) use of insulin: Secondary | ICD-10-CM | POA: Insufficient documentation

## 2023-08-10 DIAGNOSIS — Z1152 Encounter for screening for COVID-19: Secondary | ICD-10-CM | POA: Insufficient documentation

## 2023-08-10 DIAGNOSIS — Z7984 Long term (current) use of oral hypoglycemic drugs: Secondary | ICD-10-CM | POA: Insufficient documentation

## 2023-08-10 DIAGNOSIS — E119 Type 2 diabetes mellitus without complications: Secondary | ICD-10-CM | POA: Diagnosis not present

## 2023-08-10 DIAGNOSIS — R059 Cough, unspecified: Secondary | ICD-10-CM | POA: Diagnosis not present

## 2023-08-10 DIAGNOSIS — I517 Cardiomegaly: Secondary | ICD-10-CM | POA: Diagnosis not present

## 2023-08-10 DIAGNOSIS — R0989 Other specified symptoms and signs involving the circulatory and respiratory systems: Secondary | ICD-10-CM | POA: Diagnosis not present

## 2023-08-10 DIAGNOSIS — Z79899 Other long term (current) drug therapy: Secondary | ICD-10-CM | POA: Insufficient documentation

## 2023-08-10 DIAGNOSIS — J45909 Unspecified asthma, uncomplicated: Secondary | ICD-10-CM | POA: Insufficient documentation

## 2023-08-10 DIAGNOSIS — I10 Essential (primary) hypertension: Secondary | ICD-10-CM | POA: Diagnosis not present

## 2023-08-10 DIAGNOSIS — R0609 Other forms of dyspnea: Secondary | ICD-10-CM

## 2023-08-10 DIAGNOSIS — Z7901 Long term (current) use of anticoagulants: Secondary | ICD-10-CM | POA: Diagnosis not present

## 2023-08-10 DIAGNOSIS — I2510 Atherosclerotic heart disease of native coronary artery without angina pectoris: Secondary | ICD-10-CM | POA: Diagnosis not present

## 2023-08-10 DIAGNOSIS — R918 Other nonspecific abnormal finding of lung field: Secondary | ICD-10-CM | POA: Diagnosis not present

## 2023-08-10 LAB — COMPREHENSIVE METABOLIC PANEL
ALT: 23 U/L (ref 0–44)
AST: 22 U/L (ref 15–41)
Albumin: 3.5 g/dL (ref 3.5–5.0)
Alkaline Phosphatase: 74 U/L (ref 38–126)
Anion gap: 8 (ref 5–15)
BUN: 9 mg/dL (ref 6–20)
CO2: 24 mmol/L (ref 22–32)
Calcium: 8.4 mg/dL — ABNORMAL LOW (ref 8.9–10.3)
Chloride: 104 mmol/L (ref 98–111)
Creatinine, Ser: 0.84 mg/dL (ref 0.44–1.00)
GFR, Estimated: >60 mL/min (ref >60)
Glucose, Bld: 146 mg/dL — ABNORMAL HIGH (ref 70–99)
Potassium: 3.7 mmol/L (ref 3.5–5.1)
Sodium: 136 mmol/L (ref 135–145)
Total Bilirubin: 0.4 mg/dL (ref 0.3–1.2)
Total Protein: 7.2 g/dL (ref 6.5–8.1)

## 2023-08-10 LAB — TROPONIN I (HIGH SENSITIVITY): Troponin I (High Sensitivity): 11 ng/L (ref <18)

## 2023-08-10 LAB — CBC WITH DIFFERENTIAL/PLATELET
Abs Immature Granulocytes: 0.05 10*3/uL (ref 0.00–0.07)
Basophils Absolute: 0 10*3/uL (ref 0.0–0.1)
Basophils Relative: 0 %
Eosinophils Absolute: 0.7 10*3/uL — ABNORMAL HIGH (ref 0.0–0.5)
Eosinophils Relative: 8 %
HCT: 30.2 % — ABNORMAL LOW (ref 36.0–46.0)
Hemoglobin: 9.5 g/dL — ABNORMAL LOW (ref 12.0–15.0)
Immature Granulocytes: 1 %
Lymphocytes Relative: 29 %
Lymphs Abs: 2.5 10*3/uL (ref 0.7–4.0)
MCH: 27.9 pg (ref 26.0–34.0)
MCHC: 31.5 g/dL (ref 30.0–36.0)
MCV: 88.6 fL (ref 80.0–100.0)
Monocytes Absolute: 0.4 10*3/uL (ref 0.1–1.0)
Monocytes Relative: 5 %
Neutro Abs: 4.7 10*3/uL (ref 1.7–7.7)
Neutrophils Relative %: 57 %
Platelets: 330 10*3/uL (ref 150–400)
RBC: 3.41 MIL/uL — ABNORMAL LOW (ref 3.87–5.11)
RDW: 15.9 % — ABNORMAL HIGH (ref 11.5–15.5)
WBC: 8.3 10*3/uL (ref 4.0–10.5)
nRBC: 0.2 % (ref 0.0–0.2)

## 2023-08-10 LAB — RESP PANEL BY RT-PCR (RSV, FLU A&B, COVID)  RVPGX2
Influenza A by PCR: NEGATIVE
Influenza B by PCR: NEGATIVE
Resp Syncytial Virus by PCR: NEGATIVE
SARS Coronavirus 2 by RT PCR: NEGATIVE

## 2023-08-10 MED ORDER — DEXAMETHASONE SODIUM PHOSPHATE 10 MG/ML IJ SOLN
10.0000 mg | Freq: Once | INTRAMUSCULAR | Status: AC
  Administered 2023-08-11: 10 mg via INTRAVENOUS
  Filled 2023-08-10: qty 1

## 2023-08-10 NOTE — ED Triage Notes (Addendum)
Pt arriving POV for SOB & cough that began Monday. Pt has been taking her inhalers at home as well as OTC medications for her symptoms with little relief. Pt able to speak in full sentences. States she is unable to walk across her home without getting short of breath. Had her flu shot on Monday. Has hx of asthma. Pt is supposed to be on Eliquis but stopped 3 months ago.

## 2023-08-10 NOTE — ED Provider Triage Note (Signed)
Emergency Medicine Provider Triage Evaluation Note  Sara Chan , a 52 y.o. female  was evaluated in triage.  Pt complains of  a couple weeks of productive cough, congestion, and shortness of breath. Sensation of pressure on her chest for the past 4 days.   Review of Systems  Positive: As above Negative: As above  Physical Exam  BP (!) 181/100 (BP Location: Right Arm)   Pulse 96   Temp 98.6 F (37 C) (Oral)   Resp 17   Ht 5' (1.524 m)   Wt 122.5 kg   LMP  (Exact Date)   SpO2 100%   BMI 52.73 kg/m  Gen:   Awake, no distress   Resp:  Normal effort  MSK:   Moves extremities without difficulty    Medical Decision Making  Medically screening exam initiated at 10:02 PM.  Appropriate orders placed.  Sara Chan was informed that the remainder of the evaluation will be completed by another provider, this initial triage assessment does not replace that evaluation, and the importance of remaining in the ED until their evaluation is complete.     Sara Merles, PA-C 08/10/23 2206

## 2023-08-11 ENCOUNTER — Encounter (HOSPITAL_COMMUNITY): Payer: Self-pay

## 2023-08-11 ENCOUNTER — Emergency Department (HOSPITAL_COMMUNITY): Payer: 59

## 2023-08-11 DIAGNOSIS — I2510 Atherosclerotic heart disease of native coronary artery without angina pectoris: Secondary | ICD-10-CM | POA: Diagnosis not present

## 2023-08-11 DIAGNOSIS — R059 Cough, unspecified: Secondary | ICD-10-CM | POA: Diagnosis not present

## 2023-08-11 DIAGNOSIS — I517 Cardiomegaly: Secondary | ICD-10-CM | POA: Diagnosis not present

## 2023-08-11 DIAGNOSIS — R918 Other nonspecific abnormal finding of lung field: Secondary | ICD-10-CM | POA: Diagnosis not present

## 2023-08-11 LAB — TROPONIN I (HIGH SENSITIVITY): Troponin I (High Sensitivity): 11 ng/L (ref <18)

## 2023-08-11 LAB — BRAIN NATRIURETIC PEPTIDE: B Natriuretic Peptide: 82.7 pg/mL (ref 0.0–100.0)

## 2023-08-11 MED ORDER — FUROSEMIDE 20 MG PO TABS
20.0000 mg | ORAL_TABLET | Freq: Every day | ORAL | 0 refills | Status: AC
Start: 2023-08-11 — End: ?

## 2023-08-11 MED ORDER — FUROSEMIDE 20 MG PO TABS: 20.0000 mg | ORAL_TABLET | Freq: Every day | ORAL | 0 refills | Status: DC

## 2023-08-11 MED ORDER — IOHEXOL 350 MG/ML SOLN
100.0000 mL | Freq: Once | INTRAVENOUS | Status: AC | PRN
  Administered 2023-08-11: 100 mL via INTRAVENOUS

## 2023-08-11 MED ORDER — SODIUM CHLORIDE (PF) 0.9 % IJ SOLN
INTRAMUSCULAR | Status: AC
  Filled 2023-08-11: qty 50

## 2023-08-11 MED ORDER — FUROSEMIDE 10 MG/ML IJ SOLN
40.0000 mg | Freq: Once | INTRAMUSCULAR | Status: AC
  Administered 2023-08-11: 40 mg via INTRAVENOUS
  Filled 2023-08-11: qty 4

## 2023-08-11 NOTE — ED Notes (Signed)
Ambulated pt is hallway, SpO2 94-95% at beginning of walk, pt able to ambulate this time without stopping for breaks. Towards the end of walk, SpO2 dropped to 88-89% and maintained until pt sat down. Once back in room SpO2 back up to 90%. MD aware

## 2023-08-11 NOTE — ED Notes (Addendum)
Ambulated pt in the hall, pt maintained SpO2 at 95-100% in the beginning. Pt had to stop and take frequent rest breaks, said she just felt super uncomfortable, O2 started dropping, got as low as 89% but came back up and maintained at 94% once back in the room. HR also got up to to the 120's and sustained while ambulating but now is back down to 108bpm. MD aware.

## 2023-08-11 NOTE — ED Provider Notes (Signed)
[place] EMERGENCY DEPARTMENT AT [facility] Provider Note   CSN: [id number] Arrival date & time: 08/10/23  2122     History  Chief Complaint  Patient presents with   Shortness of Breath   Cough    Sara Chan is a 52 y.o. female.  HPI     This is a 52 year old female with a history of asthma who presents with shortness of breath.  Patient reports several week history of worsening shortness of breath and cough.  Has seen her primary doctor and has tried over-the-counter medications and albuterol with minimal relief.  She reports dyspnea on exertion.  She also reports chest tightness on exertion.  She has had some increased shortness of breath with laying flat but no lower extremity edema.  No history of heart failure.  Patient denies fever.  She states that she can only walk a short distance without feeling short of breath.  Home Medications Prior to Admission medications   Medication Sig Start Date End Date Taking? Authorizing Provider  furosemide (LASIX) 20 MG tablet Take 1 tablet (20 mg total) by mouth daily. 08/11/23  Yes Elise Knobloch, Mayer Masker, MD  losartan-hydrochlorothiazide (HYZAAR) 100-25 MG tablet Take 1 tablet by mouth daily. 06/02/21  Yes [provider]  acetaminophen (TYLENOL) 650 MG CR tablet Take 650 mg by mouth every 8 (eight) hours as needed for pain.    [provider]  albuterol (VENTOLIN HFA) 108 (90 Base) MCG/ACT inhaler Inhale 2 puffs into the lungs every 6 (six) hours as needed for wheezing or shortness of breath. 10/05/21   [provider]  amitriptyline (ELAVIL) 10 MG tablet Take 10 mg by mouth at bedtime. 10/05/21   [provider]  APIXABAN Everlene Balls) VTE STARTER PACK (10MG  AND 5MG ) Take as directed on package: start with two-5mg  tablets twice daily for 7 days. On day 8, switch to one-5mg  tablet twice daily. 11/05/21   Calvert Cantor, MD  benzonatate (TESSALON) 200 MG capsule Take 200 mg by mouth 3 (three) times  daily as needed for cough. 10/05/21   [provider]  BIOTIN PO Take 1 capsule by mouth daily.    [provider]  cyanocobalamin 1000 MCG tablet Take 1 tablet by mouth daily.    [provider]  gabapentin (NEURONTIN) 300 MG capsule Take 300 mg by mouth See admin instructions. 900 mg in the morning. 600 mg in the afternoon. 900 mg at bedtime 09/14/21   [provider]  guaiFENesin (ROBITUSSIN) 100 MG/5ML liquid Take 200 mg by mouth 3 (three) times daily as needed for cough.    [provider]  Insulin Glargine (SEMGLEE [place]) Inject 20 Units into the skin daily.    [provider]  ipratropium-albuterol (DUONEB) 0.5-2.5 (3) MG/3ML SOLN Inhale 3 mLs into the lungs as needed. 11/04/21   [provider]  metFORMIN (GLUCOPHAGE) 1000 MG tablet Take 500 mg by mouth 2 (two) times daily. 11/02/21   [provider]  methocarbamol (ROBAXIN) 500 MG tablet Take 500 mg by mouth 3 (three) times daily as needed for muscle spasms. 08/02/21   [provider]  metoprolol succinate (TOPROL-XL) 25 MG 24 hr tablet Take 25 mg by mouth daily. 06/29/21   [provider]  pravastatin (PRAVACHOL) 40 MG tablet Take 40 mg by mouth daily.  12/01/16   [provider]      Allergies    Codeine and Flagyl [metronidazole]    Review of Systems   Review of Systems  Constitutional:  Negative for fever.  Respiratory:  Positive for cough and shortness of breath.   Cardiovascular:  Negative for chest pain.  All other systems reviewed and are negative.   Physical Exam Updated Vital Signs BP (!) 164/81 (BP Location: Left Arm)   Pulse 96   Temp 98.1 F (36.7 C) (Oral)   Resp 18   Ht 1.524 m (5')   Wt 122.5 kg   LMP  (Exact Date)   SpO2 92%   BMI 52.73 kg/m  Physical Exam Vitals and nursing note reviewed.  Constitutional:      Appearance: She is well-developed. She is obese. She is not ill-appearing.  HENT:     Head:  Normocephalic and atraumatic.  Eyes:     Pupils: Pupils are equal, round, and reactive to light.  Cardiovascular:     Rate and Rhythm: Normal rate and regular rhythm.     Heart sounds: Normal heart sounds.  Pulmonary:     Effort: Pulmonary effort is normal. No respiratory distress.     Breath sounds: Wheezing present.     Comments: Occasional expiratory wheeze, frequent cough Abdominal:     General: Bowel sounds are normal.     Palpations: Abdomen is soft.  Musculoskeletal:     Cervical back: Neck supple.     Right lower leg: Edema present.     Left lower leg: Edema present.     Comments: 1+ bilateral lower extremity edema  Skin:    General: Skin is warm and dry.  Neurological:     Mental Status: She is alert and oriented to person, place, and time.  Psychiatric:        Mood and Affect: Mood normal.     ED Results / Procedures / Treatments   Labs (all labs ordered are listed, but only abnormal results are displayed) Labs Reviewed  CBC WITH DIFFERENTIAL/PLATELET - Abnormal; Notable for the following components:      Result Value   RBC 3.41 (*)    Hemoglobin 9.5 (*)    HCT 30.2 (*)    RDW 15.9 (*)    Eosinophils Absolute 0.7 (*)    All other components within normal limits  COMPREHENSIVE METABOLIC PANEL - Abnormal; Notable for the following components:   Glucose, Bld 146 (*)    Calcium 8.4 (*)    All other components within normal limits  RESP PANEL BY RT-PCR (RSV, FLU A&B, COVID)  RVPGX2  BRAIN NATRIURETIC PEPTIDE  TROPONIN I (HIGH SENSITIVITY)  TROPONIN I (HIGH SENSITIVITY)    EKG EKG Interpretation Date/Time:  Thursday August 10 2023 21:35:35 EDT Ventricular Rate:  97 PR Interval:  182 QRS Duration:  80 QT Interval:  340 QTC Calculation: 432 R Axis:   36  Text Interpretation: Sinus rhythm Low voltage, precordial leads Confirmed by Ross Marcus ([id number]) on 08/10/2023 11:25:11 PM  Radiology CT Angio Chest PE W and/or Wo Contrast  Result Date:  08/11/2023 CLINICAL DATA:  Pulmonary embolus suspected with high probability. Shortness of breath and cough for 4 days. Stopped Eliquis 3 months ago. EXAM: CT ANGIOGRAPHY CHEST WITH CONTRAST TECHNIQUE: Multidetector CT imaging of the chest was performed using the standard protocol during bolus administration of intravenous contrast. Multiplanar CT image reconstructions and MIPs were obtained to evaluate the vascular anatomy. RADIATION DOSE REDUCTION: This exam was performed according to the departmental dose-optimization program which includes automated exposure control, adjustment of the mA and/or kV according to patient size and/or use of iterative reconstruction technique. CONTRAST:  OMNIPAQUE IOHEXOL 350 MG/ML SOLN COMPARISON:  Chest radiograph 08/10/2023.  CT chest 11/04/2021 FINDINGS: Cardiovascular: Technically adequate study with moderately good opacification of the central and segmental pulmonary arteries. No focal filling defects are demonstrated. No evidence of significant pulmonary embolus. Mild cardiac enlargement. No pericardial effusions. Normal caliber thoracic aorta. No aortic dissection. Calcification of the aorta and coronary arteries. Mediastinum/Nodes: No enlarged mediastinal, hilar, or axillary lymph nodes. Thyroid gland, trachea, and esophagus demonstrate no significant findings. Lungs/Pleura: Patchy infiltrates are suggested in the lungs, possibly edema or pneumonia. No pleural effusions. No pneumothorax. Upper Abdomen: Diffuse fatty infiltration of the liver. No acute abnormalities. Musculoskeletal: Degenerative changes in the spine. No acute bony abnormalities. Review of the MIP images confirms the above findings. IMPRESSION: 1. No evidence of significant pulmonary embolus. 2. Mild cardiac enlargement with patchy infiltrates in the lungs, possibly edema or pneumonia. 3. Fatty infiltration of the liver. Electronically Signed   By: Burman Nieves M.D.   On: 08/11/2023 02:58   DG  Chest 2 View  Result Date: 08/10/2023 CLINICAL DATA:  Shortness of breath, cough. EXAM: CHEST - 2 VIEW COMPARISON:  10/12/2021. FINDINGS: The heart is enlarged and the mediastinal contour is within normal limits. The pulmonary vasculature is mildly distended. No consolidation, effusion, or pneumothorax. No acute osseous abnormality. IMPRESSION: Cardiomegaly with pulmonary vascular congestion. Electronically Signed   By: Thornell Sartorius M.D.   On: 08/10/2023 22:21    Procedures .Critical Care  Performed by: Shon Baton, MD Authorized by: Shon Baton, MD   Critical care provider statement:    Critical care time (minutes):  45   Critical care was necessary to treat or prevent imminent or life-threatening deterioration of the following conditions: Multiple rechecks for shortness of breath, diuresis.   Critical care was time spent personally by me on the following activities:  Development of treatment plan with patient or surrogate, discussions with consultants, evaluation of patient's response to treatment, examination of patient, ordering and review of laboratory studies, ordering and review of radiographic studies, ordering and performing treatments and interventions, pulse oximetry, re-evaluation of patient's condition and review of old charts     Medications Ordered in ED Medications  dexamethasone (DECADRON) injection 10 mg (10 mg Intravenous Given 08/11/23 0005)  furosemide (LASIX) injection 40 mg (40 mg Intravenous Given 08/11/23 0209)  iohexol (OMNIPAQUE) 350 MG/ML injection 100 mL (100 mLs Intravenous Contrast Given 08/11/23 0227)    ED Course/ Medical Decision Making/ A&P Clinical Course as of 08/11/23 0403  Fri Aug 11, 2023  8119 Per nursing, patient ambulated.  She initially did well but then dropped her oxygen saturations to 89% and felt winded.  She was given a dose of IV Lasix and has since been to the bathroom multiple times.  She states that she has incrementally  begun to feel better with each walk to the restroom.  Discussed with her that felt like she probably had some volume overload especially after looking at her CT scan.  She does not have a known history of heart failure.  Recommended admission for echo and diuresis.  Patient would really prefer to be managed at home now that she is feeling better.  Discussed with her that she still needs an echocardiogram but she does have a primary care physician.  Given that she states that she feels much better and her oxygen saturations are stable when she clinically looks less winded, feel it is reasonable to trial diuretics as an outpatient have her follow-up  with her PCP for an echocardiogram. [CH]  0354 Patient ambulate again.  Per nursing did much better.  Had better tolerance.  Maintain her pulse ox and heart rate remained stable.  Will discharge with diuretic and close PCP follow-up. [CH]    Clinical Course User Index [CH] Gaylan Fauver, Mayer Masker, MD                                 Medical Decision Making Amount and/or Complexity of Data Reviewed Labs: ordered. Radiology: ordered.  Risk Prescription drug management.   This patient presents to the ED for concern of this of breath, this involves an extensive number of treatment options, and is a complaint that carries with it a high risk of complications and morbidity.  I considered the following differential and admission for this acute, potentially life threatening condition.  The differential diagnosis includes asthma, pneumonia, pneumothorax, pulmonary edema, ACS equivalent  MDM:    This is a 52 year old female who presents with shortness of breath.  Describes dyspnea on exertion.  Has a history of asthma but not improving with DuoNebs at home.  She is in no respiratory distress but O2 sats are borderline in the mid to low 90s with rest.  She has no significant wheezing on exam.  She has very occasional wheeze with fair air movement.  She has 1+ lower  extremity edema.  No history of heart failure; however her picture does not necessarily fit asthma as well.  Labs obtained.  Chest x-ray is suggestive of possible pulmonary edema.  No leukocytosis.  Chemistry is reassuring.  BNP is normal.  EKG without acute ischemic arrhythmic changes, troponin x 2 negative.  Patient is not currently on any anticoagulants.  Had previously been on Xarelto.  CT PE study obtained shows no evidence of pulmonary embolism but does suggest pulmonary edema versus atypical infection.  I feel her clinical picture fits more with pulmonary edema.  Initially patient ambulated and was very winded and dropped her oxygen levels.  She was given IV Lasix.  We discussed admission.  Patient would like to avoid admission if possible.  After Lasix, patient diuresed significantly.  She states she felt better.  She was able to ambulate and maintain her pulse ox without significant dyspnea.  Feel it is reasonable for her to trial diuretics as an outpatient and follow-up closely with her primary physician for echocardiogram.  Cussed with patient strict return precautions.  (Labs, imaging, consults)  Labs: I Ordered, and personally interpreted labs.  The pertinent results include: CBC, BMP, BNP troponin x 2  Imaging Studies ordered: I ordered imaging studies including chest x-ray, CT PE study I independently visualized and interpreted imaging. I agree with the radiologist interpretation  Additional history obtained from chart review.  External records from outside source obtained and reviewed including prior evaluations  Cardiac Monitoring: The patient was maintained on a cardiac monitor.  If on the cardiac monitor, I personally viewed and interpreted the cardiac monitored which showed an underlying rhythm of: NS rhythm  Reevaluation: After the interventions noted above, I reevaluated the patient and found that they have :improved  Social Determinants of Health:  lives  independently  Disposition: Discharge  Co morbidities that complicate the patient evaluation  Past Medical History:  Diagnosis Date   Arthritis    Diabetes mellitus without complication (HCC)    Hyperlipidemia associated with type 2 diabetes mellitus (HCC)    Hypertension  Hypertension associated with diabetes (HCC)    OSA on CPAP    Type 2 diabetes mellitus (HCC)      Medicines Meds ordered this encounter  Medications   dexamethasone (DECADRON) injection 10 mg   furosemide (LASIX) injection 40 mg   iohexol (OMNIPAQUE) 350 MG/ML injection 100 mL   furosemide (LASIX) 20 MG tablet    Sig: Take 1 tablet (20 mg total) by mouth daily.    Dispense:  5 tablet    Refill:  0    I have reviewed the patients home medicines and have made adjustments as needed  Problem List / ED Course: Problem List Items Addressed This Visit   None Visit Diagnoses     Dyspnea on exertion    -  Primary   Acute pulmonary edema (HCC)              ,md        Final Clinical Impression(s) / ED Diagnoses Final diagnoses:  Dyspnea on exertion  Acute pulmonary edema (HCC)    Rx / DC Orders ED Discharge Orders          Ordered    furosemide (LASIX) 20 MG tablet  Daily        08/11/23 0355              Shon Baton, MD 08/11/23 [phone]

## 2023-08-11 NOTE — Discharge Instructions (Signed)
You were seen today for shortness of breath.  You likely have some fluid on your lungs.  This can sometimes represent heart failure.  You diuresed nicely and were able to have improvement of your symptoms in the emergency department.  You elected to follow-up closely with your primary doctor to have an echocardiogram.  Take Lasix for the next 5 days once daily.  If you develop worsening shortness of breath or any new or worsening symptoms, you should be reevaluated.

## 2023-08-17 DIAGNOSIS — J810 Acute pulmonary edema: Secondary | ICD-10-CM | POA: Diagnosis not present

## 2023-08-17 DIAGNOSIS — Z794 Long term (current) use of insulin: Secondary | ICD-10-CM | POA: Diagnosis not present

## 2023-08-17 DIAGNOSIS — I10 Essential (primary) hypertension: Secondary | ICD-10-CM | POA: Diagnosis not present

## 2023-08-17 DIAGNOSIS — R051 Acute cough: Secondary | ICD-10-CM | POA: Diagnosis not present

## 2023-08-17 DIAGNOSIS — Z7985 Long-term (current) use of injectable non-insulin antidiabetic drugs: Secondary | ICD-10-CM | POA: Diagnosis not present

## 2023-08-17 DIAGNOSIS — E785 Hyperlipidemia, unspecified: Secondary | ICD-10-CM | POA: Diagnosis not present

## 2023-08-17 DIAGNOSIS — Z79899 Other long term (current) drug therapy: Secondary | ICD-10-CM | POA: Diagnosis not present

## 2023-08-17 DIAGNOSIS — Z7984 Long term (current) use of oral hypoglycemic drugs: Secondary | ICD-10-CM | POA: Diagnosis not present

## 2023-08-17 DIAGNOSIS — E1169 Type 2 diabetes mellitus with other specified complication: Secondary | ICD-10-CM | POA: Diagnosis not present

## 2023-08-17 DIAGNOSIS — E1165 Type 2 diabetes mellitus with hyperglycemia: Secondary | ICD-10-CM | POA: Diagnosis not present

## 2023-08-23 DIAGNOSIS — R768 Other specified abnormal immunological findings in serum: Secondary | ICD-10-CM | POA: Diagnosis not present

## 2023-08-23 DIAGNOSIS — M25561 Pain in right knee: Secondary | ICD-10-CM | POA: Diagnosis not present

## 2023-08-23 DIAGNOSIS — M25562 Pain in left knee: Secondary | ICD-10-CM | POA: Diagnosis not present

## 2023-08-23 DIAGNOSIS — M1991 Primary osteoarthritis, unspecified site: Secondary | ICD-10-CM | POA: Diagnosis not present

## 2023-08-23 DIAGNOSIS — Z6841 Body Mass Index (BMI) 40.0 and over, adult: Secondary | ICD-10-CM | POA: Diagnosis not present

## 2023-09-20 DIAGNOSIS — Z91148 Patient's other noncompliance with medication regimen for other reason: Secondary | ICD-10-CM | POA: Diagnosis not present

## 2023-09-20 DIAGNOSIS — T50906A Underdosing of unspecified drugs, medicaments and biological substances, initial encounter: Secondary | ICD-10-CM | POA: Diagnosis not present

## 2023-09-20 DIAGNOSIS — Z87891 Personal history of nicotine dependence: Secondary | ICD-10-CM | POA: Diagnosis not present

## 2023-09-20 DIAGNOSIS — Z794 Long term (current) use of insulin: Secondary | ICD-10-CM | POA: Diagnosis not present

## 2023-09-20 DIAGNOSIS — E1165 Type 2 diabetes mellitus with hyperglycemia: Secondary | ICD-10-CM | POA: Diagnosis not present

## 2023-09-20 DIAGNOSIS — Z6841 Body Mass Index (BMI) 40.0 and over, adult: Secondary | ICD-10-CM | POA: Diagnosis not present

## 2023-09-20 DIAGNOSIS — Z7984 Long term (current) use of oral hypoglycemic drugs: Secondary | ICD-10-CM | POA: Diagnosis not present

## 2023-09-20 DIAGNOSIS — Z7985 Long-term (current) use of injectable non-insulin antidiabetic drugs: Secondary | ICD-10-CM | POA: Diagnosis not present

## 2023-10-05 ENCOUNTER — Ambulatory Visit: Payer: 59 | Attending: Cardiology | Admitting: Cardiology

## 2023-10-05 ENCOUNTER — Encounter: Payer: Self-pay | Admitting: Cardiology

## 2023-10-05 VITALS — BP 130/82 | HR 101 | Ht 60.0 in | Wt 266.4 lb

## 2023-10-05 DIAGNOSIS — E785 Hyperlipidemia, unspecified: Secondary | ICD-10-CM

## 2023-10-05 DIAGNOSIS — E119 Type 2 diabetes mellitus without complications: Secondary | ICD-10-CM

## 2023-10-05 DIAGNOSIS — E1169 Type 2 diabetes mellitus with other specified complication: Secondary | ICD-10-CM

## 2023-10-05 DIAGNOSIS — Z8709 Personal history of other diseases of the respiratory system: Secondary | ICD-10-CM | POA: Diagnosis not present

## 2023-10-05 DIAGNOSIS — Z794 Long term (current) use of insulin: Secondary | ICD-10-CM

## 2023-10-05 NOTE — Progress Notes (Signed)
Cardiology Office Note:    Date:  10/07/2023   ID:  Sara Chan, DOB January 17, 1971, MRN [id number]  PCP:  Iva Boop, MD  Cardiologist:  Thomasene Ripple, DO  Electrophysiologist:  None   Referring MD: Iona Hansen, NP   " I am   History of Present Illness:    Sara Chan is a 52 y.o. female with a hx of type 2 diabetes, hyperlipidemia, OSA on CPAP, recent imaging with fatty infiltration of the liver, morbid obesity.  She presents with a history of shortness of breath and fluid accumulation in the lungs. She reports that her symptoms seem to worsen with weather changes and she is currently experiencing ear strain and a sore throat.  She was hospitalized in 2022 due to a severe flu infection that caused a significant drop in her oxygen levels. Since then, she has had episodes of severe shortness of breath that limited her ability to walk short distances. A recent CT scan in October revealed fluid accumulation in her lungs, which responded well to Lasix treatment. She also has a history of a blood clot in her leg that led to a pulmonary embolism, for which she was on Eliquis for over a year. She has a family history of heart disease, with her father having had a pacemaker for heart palpitations. She also reports loss of feeling in her feet, suspected to be neuropathy, which started after her hospitalization for the flu.   Past Medical History:  Diagnosis Date   Arthritis    Diabetes mellitus without complication (HCC)    Hyperlipidemia associated with type 2 diabetes mellitus (HCC)    Hypertension    Hypertension associated with diabetes (HCC)    OSA on CPAP    Type 2 diabetes mellitus (HCC)     Past Surgical History:  Procedure Laterality Date   ADENOIDECTOMY     BREAST REDUCTION SURGERY     CESAREAN SECTION     NASAL SEPTUM SURGERY     NOVASURE ABLATION     TONSILLECTOMY      Current Medications: Current Meds  Medication Sig   acetaminophen (TYLENOL) 650 MG CR tablet Take  650 mg by mouth every 8 (eight) hours as needed for pain.   albuterol (VENTOLIN HFA) 108 (90 Base) MCG/ACT inhaler Inhale 2 puffs into the lungs every 6 (six) hours as needed for wheezing or shortness of breath.   benzonatate (TESSALON) 200 MG capsule Take 200 mg by mouth 3 (three) times daily as needed for cough.   BIOTIN PO Take 1 capsule by mouth daily.   cyanocobalamin 1000 MCG tablet Take 1 tablet by mouth daily.   furosemide (LASIX) 20 MG tablet Take 1 tablet (20 mg total) by mouth daily.   gabapentin (NEURONTIN) 300 MG capsule Take 300 mg by mouth See admin instructions. 900 mg in the morning. 600 mg in the afternoon. 900 mg at bedtime   guaiFENesin (ROBITUSSIN) 100 MG/5ML liquid Take 200 mg by mouth 3 (three) times daily as needed for cough.   Insulin Glargine (SEMGLEE [place]) Inject 20 Units into the skin daily.   ipratropium-albuterol (DUONEB) 0.5-2.5 (3) MG/3ML SOLN Inhale 3 mLs into the lungs as needed.   losartan-hydrochlorothiazide (HYZAAR) 100-25 MG tablet Take 1 tablet by mouth daily.   metFORMIN (GLUCOPHAGE) 1000 MG tablet Take 500 mg by mouth 2 (two) times daily.   methocarbamol (ROBAXIN) 500 MG tablet Take 500 mg by mouth 3 (three) times daily as needed for muscle spasms.   metoprolol  succinate (TOPROL-XL) 25 MG 24 hr tablet Take 25 mg by mouth daily.   pravastatin (PRAVACHOL) 40 MG tablet Take 40 mg by mouth daily.      Allergies:   Codeine and Flagyl [metronidazole]   Social History   Socioeconomic History   Marital status: Single    Spouse name: Not on file   Number of children: Not on file   Years of education: Not on file   Highest education level: Not on file  Occupational History   Not on file  Tobacco Use   Smoking status: Former    Current packs/day: 0.00    Types: Cigarettes    Quit date: 07/17/2014    Years since quitting: 9.2   Smokeless tobacco: Never  Substance and Sexual Activity   Alcohol use: No    Comment: occ   Drug use: No   Sexual activity:  Yes    Birth control/protection: None  Other Topics Concern   Not on file  Social History Narrative   Not on file   Social Drivers of Health   Financial Resource Strain: Not on file  Food Insecurity: Not on file  Transportation Needs: Not on file  Physical Activity: Not on file  Stress: Not on file  Social Connections: Not on file     Family History: The patient's family history is not on file.  ROS:   Review of Systems  Constitution: Negative for decreased appetite, fever and weight gain.  HENT: Negative for congestion, ear discharge, hoarse voice and sore throat.   Eyes: Negative for discharge, redness, vision loss in right eye and visual halos.  Cardiovascular: Negative for chest pain, dyspnea on exertion, leg swelling, orthopnea and palpitations.  Respiratory: Negative for cough, hemoptysis, shortness of breath and snoring.   Endocrine: Negative for heat intolerance and polyphagia.  Hematologic/Lymphatic: Negative for bleeding problem. Does not bruise/bleed easily.  Skin: Negative for flushing, nail changes, rash and suspicious lesions.  Musculoskeletal: Negative for arthritis, joint pain, muscle cramps, myalgias, neck pain and stiffness.  Gastrointestinal: Negative for abdominal pain, bowel incontinence, diarrhea and excessive appetite.  Genitourinary: Negative for decreased libido, genital sores and incomplete emptying.  Neurological: Negative for brief paralysis, focal weakness, headaches and loss of balance.  Psychiatric/Behavioral: Negative for altered mental status, depression and suicidal ideas.  Allergic/Immunologic: Negative for HIV exposure and persistent infections.    EKGs/Labs/Other Studies Reviewed:    The following studies were reviewed today:   EKG:  None   Recent Labs: 08/10/2023: ALT 23; BUN 9; Creatinine, Ser 0.84; Hemoglobin 9.5; Platelets 330; Potassium 3.7; Sodium 136 08/11/2023: B Natriuretic Peptide 82.7  Recent Lipid Panel    Component  Value Date/Time   TRIG 395 (H) 10/14/2021 0535    Physical Exam:    VS:  BP 130/82 (BP Location: Right Arm, Patient Position: Sitting)   Pulse (!) 101   Ht 5' (1.524 m)   Wt 266 lb 6.4 oz (120.8 kg)   SpO2 93%   BMI 52.03 kg/m     Wt Readings from Last 3 Encounters:  10/05/23 266 lb 6.4 oz (120.8 kg)  08/10/23 270 lb (122.5 kg)  11/04/21 248 lb (112.5 kg)     GEN: Well nourished, well developed in no acute distress HEENT: Normal NECK: No JVD; No carotid bruits LYMPHATICS: No lymphadenopathy CARDIAC: S1S2 noted,RRR, no murmurs, rubs, gallops RESPIRATORY:  Clear to auscultation without rales, wheezing or rhonchi  ABDOMEN: Soft, non-tender, non-distended, +bowel sounds, no guarding. EXTREMITIES: No edema, No cyanosis,  no clubbing MUSCULOSKELETAL:  No deformity  SKIN: Warm and dry NEUROLOGIC:  Alert and oriented x 3, non-focal PSYCHIATRIC:  Normal affect, good insight  ASSESSMENT:    1. Hyperlipidemia associated with type 2 diabetes mellitus (HCC)   2. Insulin-requiring or dependent type II diabetes mellitus (HCC)   3. History of pulmonary edema    PLAN:    Diabetes Mellitus - Poorly controlled with recent Hemoglobin A1c >7. Being managed by pcp. -Consider referral to endocrinology if no improvement in 16 weeks. -Continue current antidiabetic medications.  Hyperlipidemia - On Pravastatin.  She is asymptomatic and a great candidate for ASCVD risk assessment , will order coronary calcium scoring to assess for plaque accumulation in coronary arteries.  Pulmonary Edema - History of hospitalization for flu with low oxygen levels. Recent episode of shortness of breath improved with Lasix. -Continue Lasix as needed. -No need for repeat echocardiogram unless symptoms change.  History of Pulmonary Embolism  - no longer on AC.   -Schedule follow-up in 16 weeks.  The patient is in agreement with the above plan. The patient left the office in stable condition.  The patient  will follow up in   Medication Adjustments/Labs and Tests Ordered: Current medicines are reviewed at length with the patient today.  Concerns regarding medicines are outlined above.  Orders Placed This Encounter  Procedures   CT CARDIAC SCORING (SELF PAY ONLY)   No orders of the defined types were placed in this encounter.   Patient Instructions  Medication Instructions:  Your physician recommends that you continue on your current medications as directed. Please refer to the Current Medication list given to you today.  *If you need a refill on your cardiac medications before your next appointment, please call your pharmacy*   Testing/Procedures: Dr. Servando Salina has ordered a CT coronary calcium score.   Test locations:  [facility]  [facility] at [facility]  This is $99 out of pocket.   Coronary CalciumScan A coronary calcium scan is an imaging test used to look for deposits of calcium and other fatty materials (plaques) in the inner lining of the blood vessels of the heart (coronary arteries). These deposits of calcium and plaques can partly clog and narrow the coronary arteries without producing any symptoms or warning signs. This puts a person at risk for a heart attack. This test can detect these deposits before symptoms develop. Tell a health care provider about: Any allergies you have. All medicines you are taking, including vitamins, herbs, eye drops, creams, and over-the-counter medicines. Any problems you or family members have had with anesthetic medicines. Any blood disorders you have. Any surgeries you have had. Any medical conditions you have. Whether you are pregnant or may be pregnant. What are the risks? Generally, this is a safe procedure. However, problems may occur, including: Harm to a pregnant woman and her unborn baby. This test involves the use of radiation. Radiation exposure can  be dangerous to a pregnant woman and her unborn baby. If you are pregnant, you generally should not have this procedure done. Slight increase in the risk of cancer. This is because of the radiation involved in the test. What happens before the procedure? No preparation is needed for this procedure. What happens during the procedure? You will undress and remove any jewelry around your neck or chest. You will put on a hospital gown. Sticky electrodes will be placed on your chest. The electrodes will be  connected to an electrocardiogram (ECG) machine to record a tracing of the electrical activity of your heart. A CT scanner will take pictures of your heart. During this time, you will be asked to lie still and hold your breath for 2-3 seconds while a picture of your heart is being taken. The procedure may vary among health care providers and hospitals. What happens after the procedure? You can get dressed. You can return to your normal activities. It is up to you to get the results of your test. Ask your health care provider, or the department that is doing the test, when your results will be ready. Summary A coronary calcium scan is an imaging test used to look for deposits of calcium and other fatty materials (plaques) in the inner lining of the blood vessels of the heart (coronary arteries). Generally, this is a safe procedure. Tell your health care provider if you are pregnant or may be pregnant. No preparation is needed for this procedure. A CT scanner will take pictures of your heart. You can return to your normal activities after the scan is done. This information is not intended to replace advice given to you by your health care provider. Make sure you discuss any questions you have with your health care provider. Document Released: 03/31/2008 Document Revised: 08/22/2016 Document Reviewed: 08/22/2016 Elsevier Interactive Patient Education  2017 [employer].    Follow-Up: At [facility], you and your health needs are our priority.  As part of our continuing mission to provide you with exceptional heart care, we have created designated Provider Care Teams.  These Care Teams include your primary Cardiologist (physician) and Advanced Practice Providers (APPs -  Physician Assistants and Nurse Practitioners) who all work together to provide you with the care you need, when you need it.  We recommend signing up for the patient portal called "MyChart".  Sign up information is provided on this After Visit Summary.  MyChart is used to connect with patients for Virtual Visits (Telemedicine).  Patients are able to view lab/test results, encounter notes, upcoming appointments, etc.  Non-urgent messages can be sent to your provider as well.   To learn more about what you can do with MyChart, go to [email or web address].    Your next appointment:   16 week(s)  Provider:   Thomasene Ripple, DO     Adopting a Healthy Lifestyle.  Know what a healthy weight is for you (roughly BMI <25) and aim to maintain this   Aim for 7+ servings of fruits and vegetables daily   65-80+ fluid ounces of water or unsweet tea for healthy kidneys   Limit to max 1 drink of alcohol per day; avoid smoking/tobacco   Limit animal fats in diet for cholesterol and heart health - choose grass fed whenever available   Avoid highly processed foods, and foods high in saturated/trans fats   Aim for low stress - take time to unwind and care for your mental health   Aim for 150 min of moderate intensity exercise weekly for heart health, and weights twice weekly for bone health   Aim for 7-9 hours of sleep daily   When it comes to diets, agreement about the perfect plan isnt easy to find, even among the experts. Experts at the [facility] of [employer] developed an idea known as the Healthy Eating Plate. Just imagine a plate divided into logical, healthy portions.   The emphasis is on diet  quality:   Load up on  vegetables and fruits - one-half of your plate: Aim for color and variety, and remember that potatoes dont count.   Go for whole grains - one-quarter of your plate: Whole wheat, barley, wheat berries, quinoa, oats, brown rice, and foods made with them. If you want pasta, go with whole wheat pasta.   Protein power - one-quarter of your plate: Fish, chicken, beans, and nuts are all healthy, versatile protein sources. Limit red meat.   The diet, however, does go beyond the plate, offering a few other suggestions.   Use healthy plant oils, such as olive, canola, soy, corn, sunflower and peanut. Check the labels, and avoid partially hydrogenated oil, which have unhealthy trans fats.   If youre thirsty, drink water. Coffee and tea are good in moderation, but skip sugary drinks and limit milk and dairy products to one or two daily servings.   The type of carbohydrate in the diet is more important than the amount. Some sources of carbohydrates, such as vegetables, fruits, whole grains, and beans-are healthier than others.   Finally, stay active  Signed, Thomasene Ripple, DO  10/07/2023 9:43 PM    [facility]

## 2023-10-05 NOTE — Patient Instructions (Signed)
Medication Instructions:  Your physician recommends that you continue on your current medications as directed. Please refer to the Current Medication list given to you today.  *If you need a refill on your cardiac medications before your next appointment, please call your pharmacy*   Testing/Procedures: Dr. Servando Salina has ordered a CT coronary calcium score.   Test locations:  [facility]  [facility] at [facility]  This is $99 out of pocket.   Coronary CalciumScan A coronary calcium scan is an imaging test used to look for deposits of calcium and other fatty materials (plaques) in the inner lining of the blood vessels of the heart (coronary arteries). These deposits of calcium and plaques can partly clog and narrow the coronary arteries without producing any symptoms or warning signs. This puts a person at risk for a heart attack. This test can detect these deposits before symptoms develop. Tell a health care provider about: Any allergies you have. All medicines you are taking, including vitamins, herbs, eye drops, creams, and over-the-counter medicines. Any problems you or family members have had with anesthetic medicines. Any blood disorders you have. Any surgeries you have had. Any medical conditions you have. Whether you are pregnant or may be pregnant. What are the risks? Generally, this is a safe procedure. However, problems may occur, including: Harm to a pregnant woman and her unborn baby. This test involves the use of radiation. Radiation exposure can be dangerous to a pregnant woman and her unborn baby. If you are pregnant, you generally should not have this procedure done. Slight increase in the risk of cancer. This is because of the radiation involved in the test. What happens before the procedure? No preparation is needed for this procedure. What happens during the procedure? You will undress and  remove any jewelry around your neck or chest. You will put on a hospital gown. Sticky electrodes will be placed on your chest. The electrodes will be connected to an electrocardiogram (ECG) machine to record a tracing of the electrical activity of your heart. A CT scanner will take pictures of your heart. During this time, you will be asked to lie still and hold your breath for 2-3 seconds while a picture of your heart is being taken. The procedure may vary among health care providers and hospitals. What happens after the procedure? You can get dressed. You can return to your normal activities. It is up to you to get the results of your test. Ask your health care provider, or the department that is doing the test, when your results will be ready. Summary A coronary calcium scan is an imaging test used to look for deposits of calcium and other fatty materials (plaques) in the inner lining of the blood vessels of the heart (coronary arteries). Generally, this is a safe procedure. Tell your health care provider if you are pregnant or may be pregnant. No preparation is needed for this procedure. A CT scanner will take pictures of your heart. You can return to your normal activities after the scan is done. This information is not intended to replace advice given to you by your health care provider. Make sure you discuss any questions you have with your health care provider. Document Released: 03/31/2008 Document Revised: 08/22/2016 Document Reviewed: 08/22/2016 Elsevier Interactive Patient Education  2017 [employer].    Follow-Up: At [facility], you and your health needs are our priority.  As part of our continuing  mission to provide you with exceptional heart care, we have created designated Provider Care Teams.  These Care Teams include your primary Cardiologist (physician) and Advanced Practice Providers (APPs -  Physician Assistants and Nurse Practitioners) who all work together to  provide you with the care you need, when you need it.  We recommend signing up for the patient portal called "MyChart".  Sign up information is provided on this After Visit Summary.  MyChart is used to connect with patients for Virtual Visits (Telemedicine).  Patients are able to view lab/test results, encounter notes, upcoming appointments, etc.  Non-urgent messages can be sent to your provider as well.   To learn more about what you can do with MyChart, go to [email or web address].    Your next appointment:   16 week(s)  Provider:   Thomasene Ripple, DO

## 2023-10-12 DIAGNOSIS — J329 Chronic sinusitis, unspecified: Secondary | ICD-10-CM | POA: Diagnosis not present

## 2023-11-27 ENCOUNTER — Other Ambulatory Visit (HOSPITAL_BASED_OUTPATIENT_CLINIC_OR_DEPARTMENT_OTHER): Payer: 59

## 2024-01-25 ENCOUNTER — Ambulatory Visit: Payer: 59 | Attending: Cardiology | Admitting: Cardiology
